# Patient Record
Sex: Female | Born: 1985 | Race: Black or African American | Hispanic: No | Marital: Single | Smoking: Former smoker
Health system: Southern US, Community
[De-identification: ages and names within clinical notes are randomized; demographics above are authoritative.]

## PROBLEM LIST (undated history)

## (undated) ENCOUNTER — Inpatient Hospital Stay (HOSPITAL_COMMUNITY): Payer: Self-pay

## (undated) DIAGNOSIS — G4730 Sleep apnea, unspecified: Secondary | ICD-10-CM

## (undated) DIAGNOSIS — F32A Depression, unspecified: Secondary | ICD-10-CM

## (undated) DIAGNOSIS — I10 Essential (primary) hypertension: Secondary | ICD-10-CM

## (undated) DIAGNOSIS — IMO0001 Reserved for inherently not codable concepts without codable children: Secondary | ICD-10-CM

## (undated) DIAGNOSIS — A549 Gonococcal infection, unspecified: Secondary | ICD-10-CM

## (undated) DIAGNOSIS — D649 Anemia, unspecified: Secondary | ICD-10-CM

## (undated) DIAGNOSIS — A749 Chlamydial infection, unspecified: Secondary | ICD-10-CM

## (undated) DIAGNOSIS — F329 Major depressive disorder, single episode, unspecified: Secondary | ICD-10-CM

## (undated) DIAGNOSIS — F419 Anxiety disorder, unspecified: Secondary | ICD-10-CM

## (undated) DIAGNOSIS — O039 Complete or unspecified spontaneous abortion without complication: Secondary | ICD-10-CM

## (undated) DIAGNOSIS — R51 Headache: Secondary | ICD-10-CM

## (undated) DIAGNOSIS — M549 Dorsalgia, unspecified: Secondary | ICD-10-CM

## (undated) DIAGNOSIS — G8929 Other chronic pain: Secondary | ICD-10-CM

## (undated) DIAGNOSIS — E669 Obesity, unspecified: Secondary | ICD-10-CM

## (undated) HISTORY — DX: Other chronic pain: G89.29

## (undated) HISTORY — DX: Depression, unspecified: F32.A

## (undated) HISTORY — PX: DILATION AND CURETTAGE OF UTERUS: SHX78

## (undated) HISTORY — DX: Dorsalgia, unspecified: M54.9

## (undated) HISTORY — DX: Sleep apnea, unspecified: G47.30

## (undated) HISTORY — PX: INDUCED ABORTION: SHX677

## (undated) HISTORY — DX: Major depressive disorder, single episode, unspecified: F32.9

---
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
## 2007-09-17 ENCOUNTER — Inpatient Hospital Stay (HOSPITAL_COMMUNITY): Admission: AD | Admit: 2007-09-17 | Discharge: 2007-09-17 | Payer: Self-pay | Admitting: Obstetrics and Gynecology

## 2007-09-28 DIAGNOSIS — D649 Anemia, unspecified: Secondary | ICD-10-CM

## 2007-09-28 HISTORY — DX: Anemia, unspecified: D64.9

## 2008-04-01 ENCOUNTER — Inpatient Hospital Stay (HOSPITAL_COMMUNITY): Admission: AD | Admit: 2008-04-01 | Discharge: 2008-04-01 | Payer: Self-pay | Admitting: Obstetrics and Gynecology

## 2008-04-08 ENCOUNTER — Inpatient Hospital Stay (HOSPITAL_COMMUNITY): Admission: RE | Admit: 2008-04-08 | Discharge: 2008-04-10 | Payer: Self-pay | Admitting: Obstetrics and Gynecology

## 2009-08-20 ENCOUNTER — Ambulatory Visit (HOSPITAL_COMMUNITY): Admission: RE | Admit: 2009-08-20 | Discharge: 2009-08-20 | Payer: Self-pay | Admitting: Obstetrics and Gynecology

## 2010-06-19 ENCOUNTER — Encounter: Admission: RE | Admit: 2010-06-19 | Discharge: 2010-06-19 | Payer: Self-pay | Admitting: Family Medicine

## 2011-02-12 NOTE — Discharge Summary (Signed)
Sandra Wong, Sandra Wong         ACCOUNT NO.:  [id number]   MEDICAL RECORD NO.:  [id number]          PATIENT TYPE:  INP   LOCATION:  9118                          FACILITY:  [facility]   PHYSICIAN:  Huel Cote, M.D. DATE OF BIRTH:  Sep 30, 1985   DATE OF ADMISSION:  04/08/2008  DATE OF DISCHARGE:  04/10/2008                               DISCHARGE SUMMARY   DISCHARGE DIAGNOSES:  1. Term pregnancy at 40 weeks, delivered.  2. Status post normal spontaneous vaginal delivery.   DISCHARGE MEDICATIONS:  1. Motrin 600 mg p.o. every 6 hours.  2. Percocet 1-2 tablets p.o. every 4 hours p.r.n.   HOSPITAL COURSE:  The patient is a 25 year old G1, P0, who was admitted  at 64 weeks' gestation for induction of labor given term status and a  favorable cervix.  Prenatal care have been uneventful except for a  weakly positive antibody screen, which resolved on followup testing.   Prenatal labs were as follows; O+ve, antibody screen positive, followup  was negative, sickle normal, rubella immune, hepatitis B surface antigen  negative, HIV negative, GC negative, Chlamydia negative, group B strep  negative, 1-hour Glucola 71 and the first trimester screen normal.   PAST OBSTETRICAL HISTORY:  None.   PAST MEDICAL HISTORY:  Migraines.   PAST SURGICAL HISTORY:  None.   PAST GYNECOLOGIC HISTORY:  None.   ALLERGIES:  No known drug allergies.   MEDICATIONS:  Prenatal vitamins only.   On admission, the patient was afebrile with stable vital signs.  Fetal  heart rate was reactive.  Cervix was 50, 1-2, and a -2 station.  She had  rupture of membranes performed with clear fluid noted.  She progressed  quickly throughout the day reaching complete dilatation that evening and  pushed well for approximately 10 minutes with a normal spontaneous  vaginal delivery of vigorous female infant, over a  second-degree  laceration.  Apgars were 8 and 9, weight was 7 pounds even.  Placenta  delivered spontaneously.   A second-degree laceration and left focal  laceration were repaired with 2-0 Vicryl.  She had a small area of  bleeding that was persistent at the introitus, which required additional  figure-of-eight sutures.  Estimated blood loss was 450 mL.  Postpartum  day#1, her hemoglobin was 8.7  down from 11.1.  However, she was doing well ambulating and her pain was  controlled with p.o. medications.  Postoperative day #2, she was stable  and felt ready for discharge home.  She was discharged with instructions  of pelvic rest and will followup in the office in 6 weeks for her full  postpartum exam.      Huel Cote, M.D.  Electronically Signed     KR/MEDQ  D:  06/03/2008  T:  06/03/2008  Job:  [id number]

## 2011-04-11 ENCOUNTER — Emergency Department (HOSPITAL_COMMUNITY): Payer: Self-pay

## 2011-04-11 ENCOUNTER — Emergency Department (HOSPITAL_COMMUNITY)
Admission: EM | Admit: 2011-04-11 | Discharge: 2011-04-11 | Disposition: A | Discharge status: Home / Self Care | Payer: Self-pay | Attending: Emergency Medicine | Admitting: Emergency Medicine

## 2011-04-11 DIAGNOSIS — M79609 Pain in unspecified limb: Secondary | ICD-10-CM | POA: Insufficient documentation

## 2011-06-24 LAB — CBC
HCT: 26.2 — ABNORMAL LOW
HCT: 33.6 — ABNORMAL LOW
Hemoglobin: 11.1 — ABNORMAL LOW
MCHC: 33.2
MCV: 84.2
MCV: 85.3
Platelets: 103 — ABNORMAL LOW
RBC: 3.07 — ABNORMAL LOW
RBC: 3.98
RDW: 16.2 — ABNORMAL HIGH
WBC: 5.8
WBC: 9.3

## 2011-06-24 LAB — RPR: RPR Ser Ql: NONREACTIVE

## 2012-01-25 ENCOUNTER — Emergency Department (HOSPITAL_COMMUNITY)
Admission: EM | Admit: 2012-01-25 | Discharge: 2012-01-25 | Disposition: A | Discharge status: Home / Self Care | Payer: Self-pay | Attending: Emergency Medicine | Admitting: Emergency Medicine

## 2012-01-25 ENCOUNTER — Encounter (HOSPITAL_COMMUNITY): Payer: Self-pay | Admitting: *Deleted

## 2012-01-25 DIAGNOSIS — R6883 Chills (without fever): Secondary | ICD-10-CM | POA: Insufficient documentation

## 2012-01-25 DIAGNOSIS — J020 Streptococcal pharyngitis: Secondary | ICD-10-CM | POA: Insufficient documentation

## 2012-01-25 LAB — RAPID STREP SCREEN (MED CTR MEBANE ONLY): Streptococcus, Group A Screen (Direct): POSITIVE — AB

## 2012-01-25 MED ORDER — HYDROCODONE-ACETAMINOPHEN 7.5-500 MG/15ML PO SOLN: 15.0000 mL | Freq: Four times a day (QID) | ORAL | Status: AC | PRN

## 2012-01-25 MED ORDER — PENICILLIN G BENZATHINE 1200000 UNIT/2ML IM SUSP
1.2000 10*6.[IU] | INTRAMUSCULAR | Status: AC
  Administered 2012-01-25: 1.2 10*6.[IU] via INTRAMUSCULAR
  Filled 2012-01-25: qty 2

## 2012-01-25 NOTE — Discharge Instructions (Signed)

## 2012-01-25 NOTE — ED Notes (Signed)
Pt reports sore throat for 2-3 days. Reports cough/nasal drainage. Denies fever.

## 2012-01-25 NOTE — ED Provider Notes (Addendum)
History     CSN: [id number]  Arrival date & time 01/25/12  1478   First MD Initiated Contact with Patient 01/25/12 [phone]      Chief Complaint  Patient presents with  . Sore Throat    (Consider location/radiation/quality/duration/timing/severity/associated sxs/prior treatment) Patient is a 25 y.o. female presenting with pharyngitis. The history is provided by the patient.  Sore Throat This is a new problem. Episode onset: 3-4 days ago. The problem occurs constantly. The problem has been gradually worsening. Pertinent negatives include no abdominal pain, no headaches and no shortness of breath. Associated symptoms comments: Sore throat and pain with swallowing. . The symptoms are aggravated by swallowing. The symptoms are relieved by nothing. She has tried acetaminophen for the symptoms. The treatment provided no relief.    History reviewed. No pertinent past medical history.  History reviewed. No pertinent past surgical history.  No family history on file.  History  Substance Use Topics  . Smoking status: Current Everyday Smoker -- 0.5 packs/day  . Smokeless tobacco: Not on file  . Alcohol Use: Yes     occasional    OB History    Grav Para Term Preterm Abortions TAB SAB Ect Mult Living                  Review of Systems  Constitutional: Positive for chills. Negative for fever.  HENT: Positive for congestion, sore throat and trouble swallowing. Negative for rhinorrhea and voice change.   Respiratory: Negative for shortness of breath.   Gastrointestinal: Negative for nausea, vomiting and abdominal pain.  Neurological: Negative for headaches.  All other systems reviewed and are negative.    Allergies  Review of patient's allergies indicates no known allergies.  Home Medications  No current outpatient prescriptions on file.  BP 105/63  Pulse 88  Temp(Src) 97.6 F (36.4 C) (Oral)  Resp 16  Ht 5\' 9"  (1.753 m)  Wt 280 lb (127.007 kg)  BMI 41.35 kg/m2  SpO2  100%  Physical Exam  Nursing note and vitals reviewed. Constitutional: She is oriented to person, place, and time. She appears well-developed and well-nourished. No distress.  HENT:  Head: Normocephalic and atraumatic.  Mouth/Throat: Mucous membranes are normal. Posterior oropharyngeal edema and posterior oropharyngeal erythema present. No oropharyngeal exudate.  Eyes: EOM are normal. Pupils are equal, round, and reactive to light.  Cardiovascular: Normal rate, regular rhythm, normal heart sounds and intact distal pulses.  Exam reveals no friction rub.   No murmur heard. Pulmonary/Chest: Effort normal and breath sounds normal. She has no wheezes. She has no rales.  Abdominal: Soft. Bowel sounds are normal. She exhibits no distension. There is no tenderness. There is no rebound and no guarding.  Musculoskeletal: Normal range of motion. She exhibits no tenderness.       No edema  Neurological: She is alert and oriented to person, place, and time. No cranial nerve deficit.  Skin: Skin is warm and dry. No rash noted.  Psychiatric: She has a normal mood and affect. Her behavior is normal.    ED Course  Procedures (including critical care time)  Labs Reviewed  RAPID STREP SCREEN - Abnormal; Notable for the following:    Streptococcus, Group A Screen (Direct) POSITIVE (*)    All other components within normal limits   No results found.   1. Strep pharyngitis       MDM   Pt with symptoms consistent with viral versus strep pharyngitis.  Well appearing here.  No signs  of breathing difficulty  No signs of otitis or abnormal abdominal findings.  Lungs are clear. Patient is well-appearing and afebrile. Strep screen pending.  8:47 AM Rapid strep is positive. Patient IM penicillin. She was discharged home.       Gwyneth Sprout, MD 01/25/12 1610  Gwyneth Sprout, MD 01/25/12 [phone]

## 2012-09-13 ENCOUNTER — Emergency Department (HOSPITAL_COMMUNITY)
Admission: EM | Admit: 2012-09-13 | Discharge: 2012-09-13 | Disposition: A | Discharge status: Home / Self Care | Payer: No Typology Code available for payment source | Attending: Emergency Medicine | Admitting: Emergency Medicine

## 2012-09-13 ENCOUNTER — Encounter (HOSPITAL_COMMUNITY): Payer: Self-pay | Admitting: Emergency Medicine

## 2012-09-13 DIAGNOSIS — Z8659 Personal history of other mental and behavioral disorders: Secondary | ICD-10-CM | POA: Insufficient documentation

## 2012-09-13 DIAGNOSIS — R51 Headache: Secondary | ICD-10-CM | POA: Insufficient documentation

## 2012-09-13 DIAGNOSIS — F17200 Nicotine dependence, unspecified, uncomplicated: Secondary | ICD-10-CM | POA: Insufficient documentation

## 2012-09-13 HISTORY — DX: Anxiety disorder, unspecified: F41.9

## 2012-09-13 HISTORY — DX: Headache: R51

## 2012-09-13 MED ORDER — SODIUM CHLORIDE 0.9 % IV BOLUS (SEPSIS)
1000.0000 mL | Freq: Once | INTRAVENOUS | Status: AC
  Administered 2012-09-13: 1000 mL via INTRAVENOUS

## 2012-09-13 MED ORDER — METOCLOPRAMIDE HCL 5 MG/ML IJ SOLN
10.0000 mg | Freq: Once | INTRAMUSCULAR | Status: AC
  Administered 2012-09-13: 10 mg via INTRAVENOUS
  Filled 2012-09-13: qty 2

## 2012-09-13 MED ORDER — KETOROLAC TROMETHAMINE 30 MG/ML IJ SOLN
30.0000 mg | Freq: Once | INTRAMUSCULAR | Status: AC
  Administered 2012-09-13: 30 mg via INTRAVENOUS
  Filled 2012-09-13: qty 1

## 2012-09-13 NOTE — ED Notes (Addendum)
C/o headache and dizziness since 12:30pm today.  No neuro deficits noted on triage exam. Denies nausea.  Reports history of frequent headaches.

## 2012-09-13 NOTE — ED Provider Notes (Addendum)
History     CSN: [id number]  Arrival date & time 09/13/12  4098   First MD Initiated Contact with Patient 09/13/12 2110      Chief Complaint  Patient presents with  . Headache    (Consider location/radiation/quality/duration/timing/severity/associated sxs/prior treatment) HPI Pt with history of headaches off and on for the last 4 years since placement of Mirena reports moderate to severe throbbing frontal headache, associated with photophobia and nausea. Similar to previous. No particular provoking or relieving factors.   Past Medical History  Diagnosis Date  . Headache   . Anxiety     History reviewed. No pertinent past surgical history.  No family history on file.  History  Substance Use Topics  . Smoking status: Current Every Day Smoker -- 0.5 packs/day  . Smokeless tobacco: Not on file  . Alcohol Use: Yes     Comment: occasional    OB History    Grav Para Term Preterm Abortions TAB SAB Ect Mult Living                  Review of Systems All other systems reviewed and are negative except as noted in HPI.   Allergies  Other  Home Medications   Current Outpatient Rx  Name  Route  Sig  Dispense  Refill  . LEVONORGESTREL 20 MCG/24HR IU IUD   Intrauterine   1 each by Intrauterine route once.           BP 95/54  Pulse 83  Temp 98.5 F (36.9 C) (Oral)  SpO2 100%  Physical Exam  Nursing note and vitals reviewed. Constitutional: She is oriented to person, place, and time. She appears well-developed and well-nourished.  HENT:  Head: Normocephalic and atraumatic.  Eyes: EOM are normal. Pupils are equal, round, and reactive to light.  Neck: Normal range of motion. Neck supple.  Cardiovascular: Normal rate, normal heart sounds and intact distal pulses.   Pulmonary/Chest: Effort normal and breath sounds normal.  Abdominal: Bowel sounds are normal. She exhibits no distension. There is no tenderness.  Musculoskeletal: Normal range of motion. She  exhibits no edema and no tenderness.  Neurological: She is alert and oriented to person, place, and time. She has normal strength. No cranial nerve deficit or sensory deficit.  Skin: Skin is warm and dry. No rash noted.  Psychiatric: She has a normal mood and affect.    ED Course  Procedures (including critical care time)  Labs Reviewed - No data to display No results found.   No diagnosis found.    MDM  Pt feeling better after meds and IVF. Ready to go home.       Charles B. Bernette Mayers, MD 09/13/12 2330

## 2013-05-01 ENCOUNTER — Encounter (HOSPITAL_COMMUNITY): Payer: Self-pay | Admitting: *Deleted

## 2013-05-01 ENCOUNTER — Inpatient Hospital Stay (HOSPITAL_COMMUNITY)
Admission: AD | Admit: 2013-05-01 | Discharge: 2013-05-02 | Disposition: A | Discharge status: Home / Self Care | Payer: PRIVATE HEALTH INSURANCE | Source: Ambulatory Visit | Attending: Obstetrics & Gynecology | Admitting: Obstetrics & Gynecology

## 2013-05-01 DIAGNOSIS — T8389XA Other specified complication of genitourinary prosthetic devices, implants and grafts, initial encounter: Secondary | ICD-10-CM

## 2013-05-01 DIAGNOSIS — R109 Unspecified abdominal pain: Secondary | ICD-10-CM | POA: Insufficient documentation

## 2013-05-01 DIAGNOSIS — Z30431 Encounter for routine checking of intrauterine contraceptive device: Secondary | ICD-10-CM | POA: Insufficient documentation

## 2013-05-01 LAB — URINALYSIS, ROUTINE W REFLEX MICROSCOPIC
Bilirubin Urine: NEGATIVE
Nitrite: NEGATIVE
Specific Gravity, Urine: 1.025 (ref 1.005–1.030)
pH: 5.5 (ref 5.0–8.0)

## 2013-05-01 LAB — URINE MICROSCOPIC-ADD ON

## 2013-05-01 LAB — POCT PREGNANCY, URINE: Preg Test, Ur: NEGATIVE

## 2013-05-01 MED ORDER — KETOROLAC TROMETHAMINE 60 MG/2ML IM SOLN
60.0000 mg | Freq: Once | INTRAMUSCULAR | Status: AC
  Administered 2013-05-01: 60 mg via INTRAMUSCULAR
  Filled 2013-05-01: qty 2

## 2013-05-01 NOTE — MAU Note (Signed)
Patient is in with c/o llq pain and cramping, vaginal spotting, odorouc vaginal discharge. She also wants mirena iud removed because it was placed more than 5 years ago.

## 2013-05-01 NOTE — MAU Provider Note (Signed)
History     CSN: [id number]  Arrival date and time: 05/01/13 1911   First Provider Initiated Contact with Patient 05/01/13 2150      Chief Complaint  Patient presents with  . Abdominal Cramping  . Vaginal Bleeding  . Vaginal Discharge   HPI Comments: Sandra Wong presents to the MAU today with suprapubic pain.  She attributes her pain to her Mirena IUD, and would like to have it taken out today.    Abdominal Cramping This is a new problem. The current episode started in the past 7 days. The onset quality is gradual. The pain is located in the suprapubic region. The pain is at a severity of 5/10. The quality of the pain is aching and cramping. Associated symptoms include constipation and headaches. Pertinent negatives include no anorexia, dysuria, fever, frequency, nausea, vomiting or weight loss. Nothing aggravates the pain. The pain is relieved by movement. Treatments tried: Ibuprofen. The treatment provided significant relief.  Vaginal Bleeding The patient's primary symptoms include a vaginal discharge. Associated symptoms include constipation and headaches. Pertinent negatives include no anorexia, dysuria, fever, frequency, nausea, rash or vomiting.  Vaginal Discharge The patient's primary symptoms include a vaginal discharge. Associated symptoms include constipation and headaches. Pertinent negatives include no anorexia, dysuria, fever, frequency, nausea, rash or vomiting.     Past Medical History  Diagnosis Date  . Headache(784.0)   . Anxiety     Past Surgical History  Procedure Laterality Date  . No past surgeries      History reviewed. No pertinent family history.  History  Substance Use Topics  . Smoking status: Current Every Day Smoker -- 0.50 packs/day  . Smokeless tobacco: Not on file  . Alcohol Use: Yes     Comment: occasional    Allergies:  Allergies  Allergen Reactions  . Other     Has an allergy to some type of eye drop but does not remember the  name    Prescriptions prior to admission  Medication Sig Dispense Refill  . Ibuprofen (ADVIL MIGRAINE PO) Take 2 tablets by mouth daily as needed (migraine).      . IRON PO Take 1 tablet by mouth daily.      . Multiple Vitamin (MULTIVITAMIN WITH MINERALS) TABS tablet Take 1 tablet by mouth daily.      Marland Kitchen levonorgestrel (MIRENA) 20 MCG/24HR IUD 1 each by Intrauterine route once.        Review of Systems  Constitutional: Negative for fever and weight loss.  HENT: Negative for neck pain.   Respiratory: Negative.   Gastrointestinal: Positive for constipation. Negative for nausea, vomiting and anorexia.  Genitourinary: Positive for vaginal bleeding and vaginal discharge. Negative for dysuria and frequency.  Skin: Negative for rash.  Neurological: Positive for headaches. Negative for dizziness and tingling.   Physical Exam   Blood pressure 123/73, pulse 77, temperature 98.1 F (36.7 C), temperature source Oral, resp. rate 18, height 5\' 9"  (1.753 m), weight 128.113 kg (282 lb 7 oz), last menstrual period 04/24/2013, SpO2 100.00%.  Physical Exam  Constitutional: She appears well-developed and well-nourished. No distress.  Cardiovascular: Normal rate, regular rhythm, normal heart sounds and intact distal pulses.  Exam reveals no gallop and no friction rub.   No murmur heard. Respiratory: Effort normal and breath sounds normal. No respiratory distress. She has no wheezes. She has no rales. She exhibits no tenderness.  GI: Soft. Bowel sounds are normal. She exhibits no distension and no mass. There is no tenderness. There  is no rebound and no guarding.  Skin: She is not diaphoretic.   CLARK, MICHAEL L 05/01/2013, 10:02 PM   PELVIC EXAM: NEFG except for 2 cm raised, NT, flesh-colored, mass on left labia majora. Small amount of blood-tinged discharge, normal odor. CerviX clean. No CMT.   MAU Course  Procedures Speculum placed. IUD strings visualized w/ aid of cotton swab. String grasped w/  ring forceps. IUD removed intact w/ moderate traction. Scant blood loss. Pt tolerated well.   Results for orders placed during the hospital encounter of 05/01/13 (from the past 24 hour(s))  URINALYSIS, ROUTINE W REFLEX MICROSCOPIC     Status: Abnormal   Collection Time    05/01/13  7:55 PM      Result Value Range   Color, Urine YELLOW  YELLOW   APPearance CLEAR  CLEAR   Specific Gravity, Urine 1.025  1.005 - 1.030   pH 5.5  5.0 - 8.0   Glucose, UA NEGATIVE  NEGATIVE mg/dL   Hgb urine dipstick LARGE (*) NEGATIVE   Bilirubin Urine NEGATIVE  NEGATIVE   Ketones, ur NEGATIVE  NEGATIVE mg/dL   Protein, ur NEGATIVE  NEGATIVE mg/dL   Urobilinogen, UA 0.2  0.0 - 1.0 mg/dL   Nitrite NEGATIVE  NEGATIVE   Leukocytes, UA NEGATIVE  NEGATIVE  URINE MICROSCOPIC-ADD ON     Status: Abnormal   Collection Time    05/01/13  7:55 PM      Result Value Range   Squamous Epithelial / LPF MANY (*) RARE   WBC, UA 0-2  <3 WBC/hpf   RBC / HPF 0-2  <3 RBC/hpf   Bacteria, UA FEW (*) RARE   Urine-Other MUCOUS PRESENT    POCT PREGNANCY, URINE     Status: None   Collection Time    05/01/13  8:14 PM      Result Value Range   Preg Test, Ur NEGATIVE  NEGATIVE  WET PREP, GENITAL     Status: Abnormal   Collection Time    05/01/13 11:40 PM      Result Value Range   Yeast Wet Prep HPF POC NONE SEEN  NONE SEEN   Trich, Wet Prep NONE SEEN  NONE SEEN   Clue Cells Wet Prep HPF POC FEW (*) NONE SEEN   WBC, Wet Prep HPF POC FEW (*) NONE SEEN   IUD in x 5 years per pt.  Cramping improved significantly w/ Toradol and removal of IUD.   I performed the pelvic exam and IUD removal and agree with above.  Assessment: 1. IUD complication, initial encounter    Plan: D/C home in stable condition. Start OCPs ASAP. GC/CT pending. Follow-up Information   Follow up with [facility] In 1 month.   Contact information:   [address] [place] [address] [phone]      Follow up with [facility]. (As needed in emergencies)    Contact information:   [address] [id number] [place] [address] [phone]        Medication List    STOP taking these medications       levonorgestrel 20 MCG/24HR IUD  Commonly known as:  MIRENA      TAKE these medications       ADVIL MIGRAINE PO  Take 2 tablets by mouth daily as needed (migraine).     IRON PO  Take 1 tablet by mouth daily.     multivitamin with minerals Tabs tablet  Take 1 tablet by mouth daily.     norgestimate-ethinyl estradiol 0.25-35 MG-MCG tablet  Commonly known as:  ORTHO-CYCLEN,SPRINTEC,PREVIFEM  Take 1 tablet by mouth daily.       [place], [place] 05/02/2013 7:53 AM

## 2013-05-02 DIAGNOSIS — N898 Other specified noninflammatory disorders of vagina: Secondary | ICD-10-CM

## 2013-05-02 DIAGNOSIS — N938 Other specified abnormal uterine and vaginal bleeding: Secondary | ICD-10-CM

## 2013-05-02 DIAGNOSIS — R1084 Generalized abdominal pain: Secondary | ICD-10-CM

## 2013-05-02 DIAGNOSIS — N949 Unspecified condition associated with female genital organs and menstrual cycle: Secondary | ICD-10-CM

## 2013-05-02 DIAGNOSIS — Z30432 Encounter for removal of intrauterine contraceptive device: Secondary | ICD-10-CM

## 2013-05-02 LAB — WET PREP, GENITAL: Trich, Wet Prep: NONE SEEN

## 2013-05-02 MED ORDER — NORGESTIMATE-ETH ESTRADIOL 0.25-35 MG-MCG PO TABS: 1.0000 | ORAL_TABLET | Freq: Every day | ORAL | Status: DC

## 2013-05-08 NOTE — MAU Provider Note (Signed)
Attestation of Attending Supervision of Advanced Practitioner (CNM/NP): Evaluation and management procedures were performed by the Advanced Practitioner under my supervision and collaboration. I have reviewed the Advanced Practitioner's note and chart, and I agree with the management and plan.  Sandra Yin H. 3:33 PM

## 2013-06-03 ENCOUNTER — Emergency Department (HOSPITAL_COMMUNITY)
Admission: EM | Admit: 2013-06-03 | Discharge: 2013-06-03 | Disposition: A | Discharge status: Home / Self Care | Payer: Medicaid Other | Attending: Emergency Medicine | Admitting: Emergency Medicine

## 2013-06-03 ENCOUNTER — Encounter (HOSPITAL_COMMUNITY): Payer: Self-pay

## 2013-06-03 DIAGNOSIS — Z8659 Personal history of other mental and behavioral disorders: Secondary | ICD-10-CM | POA: Insufficient documentation

## 2013-06-03 DIAGNOSIS — Z79899 Other long term (current) drug therapy: Secondary | ICD-10-CM | POA: Insufficient documentation

## 2013-06-03 DIAGNOSIS — J039 Acute tonsillitis, unspecified: Secondary | ICD-10-CM

## 2013-06-03 DIAGNOSIS — F17200 Nicotine dependence, unspecified, uncomplicated: Secondary | ICD-10-CM | POA: Insufficient documentation

## 2013-06-03 MED ORDER — PENICILLIN G BENZATHINE 1200000 UNIT/2ML IM SUSP
1.2000 10*6.[IU] | Freq: Once | INTRAMUSCULAR | Status: AC
  Administered 2013-06-03: 1.2 10*6.[IU] via INTRAMUSCULAR
  Filled 2013-06-03: qty 2

## 2013-06-03 MED ORDER — HYDROCODONE-ACETAMINOPHEN 5-325 MG PO TABS: 1.0000 | ORAL_TABLET | ORAL | Status: DC | PRN

## 2013-06-03 NOTE — ED Notes (Signed)
Pt dc to home. Pt sts understanding to dc instructions. Pt ambulatory to exit without difficulty. Pt denies need for w/c. No adverse reaction noted to site.

## 2013-06-03 NOTE — ED Provider Notes (Signed)
CSN: [id number]     Arrival date & time 06/03/13  0031 History   First MD Initiated Contact with Patient 06/03/13 0117     Chief Complaint  Patient presents with  . Sore Throat   (Consider location/radiation/quality/duration/timing/severity/associated sxs/prior Treatment) HPI History provided by pt.   Pt presents w/ sore throat since yesterday.  Feels like sharp knives when she attempts to swallow food.  No associated fever, nasal congestion, rhinorrhea, cough.  Has had minimal relief w/ ibuprofen.  No known sick contacts.   Past Medical History  Diagnosis Date  . Headache(784.0)   . Anxiety    Past Surgical History  Procedure Laterality Date  . No past surgeries     History reviewed. No pertinent family history. History  Substance Use Topics  . Smoking status: Current Every Day Smoker -- 0.50 packs/day  . Smokeless tobacco: Not on file  . Alcohol Use: Yes     Comment: occasional   OB History   Grav Para Term Preterm Abortions TAB SAB Ect Mult Living   1 1 1       1      Review of Systems  All other systems reviewed and are negative.    Allergies  Other  Home Medications   Current Outpatient Rx  Name  Route  Sig  Dispense  Refill  . HYDROcodone-acetaminophen (NORCO/VICODIN) 5-325 MG per tablet   Oral   Take 1 tablet by mouth every 4 (four) hours as needed for pain.   6 tablet   0   . Ibuprofen (ADVIL MIGRAINE PO)   Oral   Take 2 tablets by mouth daily as needed (migraine).         . IRON PO   Oral   Take 1 tablet by mouth daily.         . Multiple Vitamin (MULTIVITAMIN WITH MINERALS) TABS tablet   Oral   Take 1 tablet by mouth daily.         . norgestimate-ethinyl estradiol (ORTHO-CYCLEN,SPRINTEC,PREVIFEM) 0.25-35 MG-MCG tablet   Oral   Take 1 tablet by mouth daily.   1 Package   2    BP 132/78  Pulse 85  Temp(Src) 98.6 F (37 C) (Oral)  Resp 20  SpO2 99% Physical Exam  Nursing note and vitals reviewed. Constitutional: She is oriented  to person, place, and time. She appears well-developed and well-nourished. No distress.  HENT:  Head: Normocephalic and atraumatic.  Symmetric enlargement as well as erythema and exudate of tonsils.  Uvula mid-line.  No trismus.  Nml TM/EAC bilaterally.  Eyes:  Normal appearance  Neck: Normal range of motion.  Pulmonary/Chest: Effort normal.  Musculoskeletal: Normal range of motion.  Neurological: She is alert and oriented to person, place, and time.  Psychiatric: She has a normal mood and affect. Her behavior is normal.    ED Course  Procedures (including critical care time) Labs Review Labs Reviewed - No data to display Imaging Review No results found.  MDM   1. Tonsillitis   27yo healthy F presents w/ sore throat.  Exam consistent w/ tonsillitis.  Pt received IM bicillin empirically and d/c'd home w/ 6 vicodin.  Recommended q8hr ibuprofen as well. Return precautions discussed.    Otilio Miu, PA-C 06/03/13 [phone]

## 2013-06-03 NOTE — ED Notes (Signed)
Pt reports productive cough with thick clear mucus, nasal congestion and drainage, headache, and sore throat increase pain when swallowing starting yesterady

## 2013-06-03 NOTE — ED Provider Notes (Signed)
Medical screening examination/treatment/procedure(s) were performed by non-physician practitioner and as supervising physician I was immediately available for consultation/collaboration.   Shanna Cisco, MD 06/03/13 1910

## 2013-06-04 ENCOUNTER — Encounter (HOSPITAL_COMMUNITY): Payer: Self-pay | Admitting: Emergency Medicine

## 2013-06-04 ENCOUNTER — Emergency Department (HOSPITAL_COMMUNITY)
Admission: EM | Admit: 2013-06-04 | Discharge: 2013-06-04 | Disposition: A | Discharge status: Home / Self Care | Payer: Medicaid Other | Attending: Emergency Medicine | Admitting: Emergency Medicine

## 2013-06-04 DIAGNOSIS — R5381 Other malaise: Secondary | ICD-10-CM | POA: Insufficient documentation

## 2013-06-04 DIAGNOSIS — F17200 Nicotine dependence, unspecified, uncomplicated: Secondary | ICD-10-CM | POA: Insufficient documentation

## 2013-06-04 DIAGNOSIS — R599 Enlarged lymph nodes, unspecified: Secondary | ICD-10-CM | POA: Insufficient documentation

## 2013-06-04 DIAGNOSIS — Z8669 Personal history of other diseases of the nervous system and sense organs: Secondary | ICD-10-CM | POA: Insufficient documentation

## 2013-06-04 DIAGNOSIS — J039 Acute tonsillitis, unspecified: Secondary | ICD-10-CM | POA: Insufficient documentation

## 2013-06-04 DIAGNOSIS — H6120 Impacted cerumen, unspecified ear: Secondary | ICD-10-CM | POA: Insufficient documentation

## 2013-06-04 DIAGNOSIS — F411 Generalized anxiety disorder: Secondary | ICD-10-CM | POA: Insufficient documentation

## 2013-06-04 LAB — CBC WITH DIFFERENTIAL/PLATELET
Band Neutrophils: 0 % (ref 0–10)
Basophils Absolute: 0.1 10*3/uL (ref 0.0–0.1)
Basophils Relative: 1 % (ref 0–1)
Eosinophils Absolute: 0.2 10*3/uL (ref 0.0–0.7)
Eosinophils Relative: 3 % (ref 0–5)
HCT: 36.8 % (ref 36.0–46.0)
Hemoglobin: 12.7 g/dL (ref 12.0–15.0)
MCH: 28.2 pg (ref 26.0–34.0)
MCHC: 34.5 g/dL (ref 30.0–36.0)
MCV: 81.6 fL (ref 78.0–100.0)
Metamyelocytes Relative: 0 %
Myelocytes: 0 %
Neutrophils Relative %: 56 % (ref 43–77)
Promyelocytes Absolute: 0 %
RBC: 4.51 MIL/uL (ref 3.87–5.11)

## 2013-06-04 LAB — RAPID STREP SCREEN (MED CTR MEBANE ONLY): Streptococcus, Group A Screen (Direct): NEGATIVE

## 2013-06-04 MED ORDER — CLINDAMYCIN HCL 150 MG PO CAPS: 300.0000 mg | ORAL_CAPSULE | Freq: Three times a day (TID) | ORAL | Status: AC

## 2013-06-04 MED ORDER — DEXAMETHASONE SODIUM PHOSPHATE 10 MG/ML IJ SOLN
10.0000 mg | Freq: Once | INTRAMUSCULAR | Status: AC
  Administered 2013-06-04: 10 mg via INTRAMUSCULAR
  Filled 2013-06-04: qty 1

## 2013-06-04 NOTE — ED Notes (Addendum)
Pt was here yesterday for sore throat. Dx'd with pharyngitis. Returns today because she states "my throat feels like it is on fire". No strep test obtained yesterday.

## 2013-06-04 NOTE — ED Notes (Signed)
Pt c/o sore throat; pt seen here for same and given meds but not better

## 2013-06-04 NOTE — ED Provider Notes (Signed)
CSN: [id number]     Arrival date & time 06/04/13  6213 History   First MD Initiated Contact with Patient 06/04/13 0957     Chief Complaint  Patient presents with  . Sore Throat    HPI  Sandra Wong is a 27 y.o. female with a PMH of headaches and anxiety who presents to the ED for evaluation of a sore throat.  History was provided by the patient.  Patient complains of a sore throat which started two days ago.  She was seen in the ED here yesterday (06/03/13) and received a PCN injection.  She states that her condition has not improved since receiving the antibiotics.  She states she has a sharp pain when swallowing.  She has been taking the Vicodin with minimal relief in her symptoms.  She denies any neck pain/stiffness, drooling or difficulty controlling her secretions or breathing.  She has had decreased intake due to her throat pain. She also states she has been very fatigued and had decreased activity. She denies any fever, chills, rhinorrhea, congestion, cough, chest pain, SOB, abdominal pain, nausea, emesis, dysuria, hematuria, diarrhea, constipation, headache, dizziness, or lightheadedness. No known sick contacts.     Past Medical History  Diagnosis Date  . Headache(784.0)   . Anxiety    Past Surgical History  Procedure Laterality Date  . No past surgeries     History reviewed. No pertinent family history. History  Substance Use Topics  . Smoking status: Current Every Day Smoker -- 0.50 packs/day  . Smokeless tobacco: Not on file  . Alcohol Use: Yes     Comment: occasional   OB History   Grav Para Term Preterm Abortions TAB SAB Ect Mult Living   1 1 1       1      Review of Systems  Constitutional: Positive for activity change and fatigue. Negative for fever, chills and appetite change.  HENT: Positive for sore throat. Negative for ear pain, congestion, facial swelling, rhinorrhea, drooling, mouth sores, trouble swallowing, neck pain, neck stiffness, dental problem, voice  change and sinus pressure.   Eyes: Negative for visual disturbance.  Respiratory: Negative for cough and shortness of breath.   Cardiovascular: Negative for chest pain and leg swelling.  Gastrointestinal: Negative for nausea, vomiting, abdominal pain, diarrhea and constipation.  Genitourinary: Negative for dysuria.  Musculoskeletal: Negative for back pain.  Skin: Negative for rash.  Neurological: Negative for dizziness, weakness, light-headedness and headaches.    Allergies  Other  Home Medications   Current Outpatient Rx  Name  Route  Sig  Dispense  Refill  . HYDROcodone-acetaminophen (NORCO/VICODIN) 5-325 MG per tablet   Oral   Take 1 tablet by mouth every 4 (four) hours as needed for pain.         Marland Kitchen ibuprofen (ADVIL,MOTRIN) 200 MG tablet   Oral   Take 400 mg by mouth every 6 (six) hours as needed for pain.         . Multiple Vitamin (MULTIVITAMIN WITH MINERALS) TABS tablet   Oral   Take 1 tablet by mouth daily.          BP 118/67  Pulse 94  Temp(Src) 99.1 F (37.3 C) (Oral)  Resp 20  SpO2 99%  Filed Vitals:   06/04/13 0841 06/04/13 1241  BP: 118/67 118/71  Pulse: 94 82  Temp: 99.1 F (37.3 C) 97.5 F (36.4 C)  TempSrc: Oral Oral  Resp: 20 16  SpO2: 99% 99%   Physical Exam  Nursing note and vitals reviewed. Constitutional: She is oriented to person, place, and time. She appears well-developed and well-nourished. No distress.  HENT:  Head: Normocephalic and atraumatic.  Right Ear: External ear normal.  Left Ear: External ear normal.  Nose: Nose normal.  Mouth/Throat: Oropharyngeal exudate present.  Tonsils 2+ bilaterally with erythema and exudates.  Uvula midline.  No trismus.  No tenderness to palpation to the jaw throughout.  Right TM unable to be visualized due to cerumen.  Left TM gray and translucent.    Eyes: Conjunctivae are normal. Pupils are equal, round, and reactive to light. Right eye exhibits no discharge. Left eye exhibits no discharge.   Neck: Normal range of motion. Neck supple.  Neck is symmetrical.  Tender mobile submandibular LAD bilaterally.    Cardiovascular: Normal rate, regular rhythm and intact distal pulses.  Exam reveals no gallop and no friction rub.   No murmur heard. Pulmonary/Chest: Effort normal and breath sounds normal. No respiratory distress. She has no wheezes. She has no rales. She exhibits no tenderness.  Abdominal: Soft. Bowel sounds are normal. She exhibits no distension. There is no tenderness. There is no rebound and no guarding.  Musculoskeletal: Normal range of motion. She exhibits no edema and no tenderness.  Lymphadenopathy:    She has cervical adenopathy.  Neurological: She is alert and oriented to person, place, and time.  Skin: Skin is warm and dry. She is not diaphoretic.    ED Course  Procedures (including critical care time) Labs Review Labs Reviewed  RAPID STREP SCREEN  CULTURE, GROUP A STREP   Imaging Review No results found.  Results for orders placed during the hospital encounter of 06/04/13  RAPID STREP SCREEN      Result Value Range   Streptococcus, Group A Screen (Direct) NEGATIVE  NEGATIVE  MONONUCLEOSIS SCREEN      Result Value Range   Mono Screen NEGATIVE  NEGATIVE  CBC WITH DIFFERENTIAL      Result Value Range   WBC 8.1  4.0 - 10.5 K/uL   RBC 4.51  3.87 - 5.11 MIL/uL   Hemoglobin 12.7  12.0 - 15.0 g/dL   HCT 40.9  81.1 - 91.4 %   MCV 81.6  78.0 - 100.0 fL   MCH 28.2  26.0 - 34.0 pg   MCHC 34.5  30.0 - 36.0 g/dL   RDW 78.2  95.6 - 21.3 %   Platelets 263  150 - 400 K/uL   Neutrophils Relative % 56  43 - 77 %   Lymphocytes Relative 26  12 - 46 %   Monocytes Relative 14 (*) 3 - 12 %   Eosinophils Relative 3  0 - 5 %   Basophils Relative 1  0 - 1 %   Band Neutrophils 0  0 - 10 %   Metamyelocytes Relative 0     Myelocytes 0     Promyelocytes Absolute 0     Blasts 0     nRBC 0  0 /100 WBC   Neutro Abs 4.6  1.7 - 7.7 K/uL   Lymphs Abs 2.1  0.7 - 4.0 K/uL    Monocytes Absolute 1.1 (*) 0.1 - 1.0 K/uL   Eosinophils Absolute 0.2  0.0 - 0.7 K/uL   Basophils Absolute 0.1  0.0 - 0.1 K/uL     MDM   1. Tonsillitis     Sandra Wong is a 27 y.o. female with a PMH of headaches and anxiety who presents to the ED  for evaluation of a sore throat.  Rapid strep, CBC and monospot was ordered to further evaluate.  Decadron was ordered for symptomatic relief.     Etiology of sore throat is likely tonsillitis with a viral vs. bacterial etiology.  Monospot and rapid strep were negative.  Throat culture is pending.  Patient did not have leukocytosis or fever and remained in no acute distress throughout her ED visit.  Patient was given Decadron in the ED to help with swelling.  Patient was prescribed clindamycin for outpatient management.  Patient received PCN IM in the ED yesterday.  She was instructed to follow-up with a PCP as soon as possible this week. She was instructed to return to the ED if they experience any difficulty controlling secretions/breathing, fever, worsening symptoms, neck swelling, or other concerns.  Patient was in agreement with discharge and plan.     Final impressions: 1. Tonsillitis     Greer Ee Hildegarde Dunaway PA-C     Jillyn Ledger, PA-C 06/05/13 1155

## 2013-06-06 LAB — CULTURE, GROUP A STREP

## 2013-06-06 NOTE — ED Provider Notes (Signed)
Medical screening examination/treatment/procedure(s) were performed by non-physician practitioner and as supervising physician I was immediately available for consultation/collaboration.   Darlys Gales, MD 06/06/13 [phone]

## 2013-06-07 ENCOUNTER — Emergency Department (HOSPITAL_COMMUNITY)
Admission: EM | Admit: 2013-06-07 | Discharge: 2013-06-08 | Disposition: A | Discharge status: Home / Self Care | Payer: Medicaid Other | Attending: Emergency Medicine | Admitting: Emergency Medicine

## 2013-06-07 ENCOUNTER — Encounter (HOSPITAL_COMMUNITY): Payer: Self-pay | Admitting: Emergency Medicine

## 2013-06-07 ENCOUNTER — Emergency Department (HOSPITAL_COMMUNITY): Payer: Medicaid Other

## 2013-06-07 DIAGNOSIS — J039 Acute tonsillitis, unspecified: Secondary | ICD-10-CM | POA: Insufficient documentation

## 2013-06-07 DIAGNOSIS — F17200 Nicotine dependence, unspecified, uncomplicated: Secondary | ICD-10-CM | POA: Insufficient documentation

## 2013-06-07 DIAGNOSIS — Z792 Long term (current) use of antibiotics: Secondary | ICD-10-CM | POA: Insufficient documentation

## 2013-06-07 DIAGNOSIS — R599 Enlarged lymph nodes, unspecified: Secondary | ICD-10-CM | POA: Insufficient documentation

## 2013-06-07 DIAGNOSIS — Z8659 Personal history of other mental and behavioral disorders: Secondary | ICD-10-CM | POA: Insufficient documentation

## 2013-06-07 DIAGNOSIS — Z79899 Other long term (current) drug therapy: Secondary | ICD-10-CM | POA: Insufficient documentation

## 2013-06-07 LAB — CBC WITH DIFFERENTIAL/PLATELET
Basophils Relative: 0 % (ref 0–1)
Eosinophils Absolute: 0 10*3/uL (ref 0.0–0.7)
Eosinophils Relative: 0 % (ref 0–5)
HCT: 39.7 % (ref 36.0–46.0)
Hemoglobin: 13.3 g/dL (ref 12.0–15.0)
MCH: 27.3 pg (ref 26.0–34.0)
MCHC: 33.5 g/dL (ref 30.0–36.0)
MCV: 81.4 fL (ref 78.0–100.0)
Monocytes Absolute: 1.4 10*3/uL — ABNORMAL HIGH (ref 0.1–1.0)
Monocytes Relative: 13 % — ABNORMAL HIGH (ref 3–12)
Neutro Abs: 6.9 10*3/uL (ref 1.7–7.7)
RDW: 14.9 % (ref 11.5–15.5)

## 2013-06-07 LAB — COMPREHENSIVE METABOLIC PANEL
Albumin: 3.5 g/dL (ref 3.5–5.2)
BUN: 8 mg/dL (ref 6–23)
Calcium: 8.9 mg/dL (ref 8.4–10.5)
Chloride: 97 mEq/L (ref 96–112)
Creatinine, Ser: 0.92 mg/dL (ref 0.50–1.10)
Total Bilirubin: 0.4 mg/dL (ref 0.3–1.2)

## 2013-06-07 MED ORDER — IOHEXOL 300 MG/ML  SOLN
75.0000 mL | Freq: Once | INTRAMUSCULAR | Status: AC | PRN
  Administered 2013-06-07: 75 mL via INTRAVENOUS

## 2013-06-07 MED ORDER — HYDROCODONE-ACETAMINOPHEN 5-325 MG PO TABS
2.0000 | ORAL_TABLET | Freq: Once | ORAL | Status: AC
  Administered 2013-06-07: 2 via ORAL
  Filled 2013-06-07: qty 2

## 2013-06-07 NOTE — ED Notes (Signed)
The patient is here for the 3rd visit for sorethroat. She received PCN on first visit and is now on Cleocin. Tonsils remain red and enlarged with white/yellow exudate.

## 2013-06-07 NOTE — ED Notes (Signed)
Pt c/o continued pain and swelling in throat; pt seen here x 2

## 2013-06-07 NOTE — ED Provider Notes (Signed)
CSN: [id number]     Arrival date & time 06/07/13  1813 History  This chart was scribed for non-physician practitioner Georgeanna Harrison, working with Glynn Octave, MD by Dorothey Baseman, ED Scribe. This patient was seen in room TR04C/TR04C and the patient's care was started at 8:17 PM.    Chief Complaint  Patient presents with  . Sore Throat   The history is provided by the patient. No language interpreter was used.   HPI Comments: Sandra Wong is a 27 y.o. female who presents to the Emergency Department complaining of pain and swelling of the throat for the past few weeks. Patient states that she has been seen in the ED twice over the past few days and was given an injection of Penicillin first, then given an injection of Decadron and a prescription for Clindamycin, but states that her symptoms have remained unchanged.  She is taking the Clindamycin as prescribed.  She reports associated pain with and difficulty swallowing, some difficulty breathing during sleep, and fever last night. Patient states that she has never been diagnosed with strep throat.  Past Medical History  Diagnosis Date  . Headache(784.0)   . Anxiety    Past Surgical History  Procedure Laterality Date  . No past surgeries     History reviewed. No pertinent family history. History  Substance Use Topics  . Smoking status: Current Every Day Smoker -- 0.50 packs/day  . Smokeless tobacco: Not on file  . Alcohol Use: Yes     Comment: occasional   OB History   Grav Para Term Preterm Abortions TAB SAB Ect Mult Living   1 1 1       1      Review of Systems  A complete 10 system review of systems was obtained and all systems are negative except as noted in the HPI and PMH.   Allergies  Other  Home Medications   Current Outpatient Rx  Name  Route  Sig  Dispense  Refill  . clindamycin (CLEOCIN) 150 MG capsule   Oral   Take 2 capsules (300 mg total) by mouth 3 (three) times daily. May dispense as 150mg   capsules   60 capsule   0   . HYDROcodone-acetaminophen (NORCO/VICODIN) 5-325 MG per tablet   Oral   Take 1 tablet by mouth every 4 (four) hours as needed for pain.         Marland Kitchen ibuprofen (ADVIL,MOTRIN) 200 MG tablet   Oral   Take 400 mg by mouth every 6 (six) hours as needed for pain.         . Multiple Vitamin (MULTIVITAMIN WITH MINERALS) TABS tablet   Oral   Take 1 tablet by mouth daily.          Triage Vitals: BP 125/72  Pulse 117  Temp(Src) 99.1 F (37.3 C) (Oral)  Resp 18  SpO2 97%  Physical Exam  Nursing note and vitals reviewed. Constitutional: She is oriented to person, place, and time. She appears well-developed and well-nourished. No distress.  HENT:  Head: Normocephalic and atraumatic.  Mouth/Throat: Uvula is midline. No trismus in the jaw. No edematous.  Bilateral swelling of the tonsils. Bilateral tonsillar exudate. Uvula is midline.  Patient handling secretions well.    Eyes: Conjunctivae are normal.  Neck: Normal range of motion. Neck supple.  Tenderness to palpation to the anterior cervical lymph nodes.  Cardiovascular: Normal rate, regular rhythm and normal heart sounds.   Pulmonary/Chest: Effort normal and breath sounds normal.  No respiratory distress.  Musculoskeletal: Normal range of motion.  Lymphadenopathy:    She has cervical adenopathy.  Neurological: She is alert and oriented to person, place, and time.  Skin: Skin is warm and dry.  Psychiatric: She has a normal mood and affect. Her behavior is normal.    ED Course  Procedures (including critical care time)  Medications  HYDROcodone-acetaminophen (NORCO/VICODIN) 5-325 MG per tablet 2 tablet (2 tablets Oral Given 06/07/13 2220)  iohexol (OMNIPAQUE) 300 MG/ML solution 75 mL (75 mLs Intravenous Contrast Given 06/07/13 2302)   DIAGNOSTIC STUDIES: Oxygen Saturation is 97% on room air, normal by my interpretation.    COORDINATION OF CARE: 8:20PM- Will refer patient to an ENT specialist.  Discussed treatment plan with patient at bedside and patient verbalized agreement.   8:45PM- Consulted with Dr. Manus Gunning. Will order a soft tissue CT of the neck to rule out possibility of  mononucleosis. Discussed treatment plan with patient at bedside and patient verbalized agreement.    Labs Review Labs Reviewed  CBC WITH DIFFERENTIAL - Abnormal; Notable for the following:    WBC 10.8 (*)    Monocytes Relative 13 (*)    Monocytes Absolute 1.4 (*)    All other components within normal limits  COMPREHENSIVE METABOLIC PANEL - Abnormal; Notable for the following:    Total Protein 8.4 (*)    GFR calc non Af Amer 86 (*)    All other components within normal limits  MONONUCLEOSIS SCREEN   Imaging Review Ct Soft Tissue Neck W Contrast  06/07/2013   CLINICAL DATA:  Pharyngitis. Dysphagia.  EXAM: CT NECK WITH CONTRAST  TECHNIQUE: Multidetector CT imaging of the neck was performed using the standard protocol following the bolus administration of intravenous contrast.  CONTRAST:  75mL OMNIPAQUE IOHEXOL 300 MG/ML  SOLN  COMPARISON:  None.  FINDINGS: Diffuse enlargement of the tonsillar pillars bilaterally. No masses or fluid collections. Multiple enlarged jugulodigastric lymph nodes bilaterally. The largest include a 2.2 cm short axis node on the right on image number 40 and a 2.4 cm short axis node on the left on image number 42. Minimally enlarged posterior triangle lymph nodes bilaterally. These include a 6 mm short axis node on the left on image number 35. Minimally prominent submandibular lymph nodes, including a 8 mm short axis node on the right on image number 64. Unremarkable bones.  IMPRESSION: 1. Extensive bilateral adenopathy, as described above. Differential considerations include reactive adenopathy, lymphoproliferative disorder such as lymphoma and leukemia, and sarcoidosis. 2. Symmetrically enlarged tonsillar pillars bilaterally without abscess.   Electronically Signed   By: Gordan Payment   On:  06/07/2013 23:29   Patient discussed with Dr. Manus Gunning who also evaluated the patient.   MDM  No diagnosis found. Patient presenting with sore throat.  This is her third visit for the same.  On exam tonsils enlarged bilaterally with exudates.  Uvula midline.  Patient handling secretion.  CT soft tissue neck ordered, which showed extensive bilateral adenopathy and symmetrically enlarged tonsils without abscess.  Feel that the adenopathy is most likely reactive adenopathy.  Patient currently on Clindamycin.  Patient instructed to follow up with PCP.  Patient also given referral to ENT.  Return precautions given.  I personally performed the services described in this documentation, which was scribed in my presence. The recorded information has been reviewed and is accurate.    Pascal Lux [place], PA-C 06/13/13 [phone]

## 2013-06-07 NOTE — ED Notes (Signed)
  Pt transported to ct 

## 2013-06-07 NOTE — ED Notes (Signed)
Pt requesting something for pain. Heather aware.

## 2013-06-08 MED ORDER — HYDROCODONE-ACETAMINOPHEN 7.5-325 MG/15ML PO SOLN: 15.0000 mL | Freq: Four times a day (QID) | ORAL | Status: DC | PRN

## 2013-06-08 MED ORDER — LIDOCAINE VISCOUS 2 % MT SOLN: 15.0000 mL | OROMUCOSAL | Status: DC | PRN

## 2013-06-08 MED ORDER — PREDNISONE 20 MG PO TABS: 60.0000 mg | ORAL_TABLET | Freq: Every day | ORAL | Status: DC

## 2013-06-08 NOTE — ED Notes (Signed)
Pt dc to home. Pt sts understanding to dc instructions. Pt ambulatory to exit without difficulty. Pt denies need for w/.c. Pt dc with family.

## 2013-06-13 NOTE — ED Provider Notes (Signed)
Medical screening examination/treatment/procedure(s) were performed by non-physician practitioner and as supervising physician I was immediately available for consultation/collaboration.   Shakinah Navis, MD 06/13/13 1851 

## 2013-06-18 ENCOUNTER — Encounter: Payer: Self-pay | Admitting: Nurse Practitioner

## 2013-06-18 ENCOUNTER — Ambulatory Visit (INDEPENDENT_AMBULATORY_CARE_PROVIDER_SITE_OTHER): Payer: PRIVATE HEALTH INSURANCE | Admitting: Nurse Practitioner

## 2013-06-18 VITALS — BP 113/73 | HR 105 | Temp 97.6°F | Ht 67.0 in | Wt 268.8 lb

## 2013-06-18 DIAGNOSIS — Z309 Encounter for contraceptive management, unspecified: Secondary | ICD-10-CM

## 2013-06-18 DIAGNOSIS — G43109 Migraine with aura, not intractable, without status migrainosus: Secondary | ICD-10-CM | POA: Insufficient documentation

## 2013-06-18 DIAGNOSIS — E669 Obesity, unspecified: Secondary | ICD-10-CM

## 2013-06-18 DIAGNOSIS — N898 Other specified noninflammatory disorders of vagina: Secondary | ICD-10-CM

## 2013-06-18 DIAGNOSIS — G43009 Migraine without aura, not intractable, without status migrainosus: Secondary | ICD-10-CM

## 2013-06-18 DIAGNOSIS — Z01419 Encounter for gynecological examination (general) (routine) without abnormal findings: Secondary | ICD-10-CM

## 2013-06-18 MED ORDER — NORGESTIMATE-ETH ESTRADIOL 0.25-35 MG-MCG PO TABS: 1.0000 | ORAL_TABLET | Freq: Every day | ORAL | Status: DC

## 2013-06-18 NOTE — Addendum Note (Signed)
Addended by: Kathee Delton on: 06/18/2013 05:07 PM   Modules accepted: Orders

## 2013-06-18 NOTE — Patient Instructions (Signed)
Migraine Headache A migraine headache is an intense, throbbing pain on one or both sides of your head. A migraine can last for 30 minutes to several hours. CAUSES  The exact cause of a migraine headache is not always known. However, a migraine may be caused when nerves in the brain become irritated and release chemicals that cause inflammation. This causes pain. SYMPTOMS  Pain on one or both sides of your head.  Pulsating or throbbing pain.  Severe pain that prevents daily activities.  Pain that is aggravated by any physical activity.  Nausea, vomiting, or both.  Dizziness.  Pain with exposure to bright lights, loud noises, or activity.  General sensitivity to bright lights, loud noises, or smells. Before you get a migraine, you may get warning signs that a migraine is coming (aura). An aura may include:  Seeing flashing lights.  Seeing bright spots, halos, or zig-zag lines.  Having tunnel vision or blurred vision.  Having feelings of numbness or tingling.  Having trouble talking.  Having muscle weakness. MIGRAINE TRIGGERS  Alcohol.  Smoking.  Stress.  Menstruation.  Aged cheeses.  Foods or drinks that contain nitrates, glutamate, aspartame, or tyramine.  Lack of sleep.  Chocolate.  Caffeine.  Hunger.  Physical exertion.  Fatigue.  Medicines used to treat chest pain (nitroglycerine), birth control pills, estrogen, and some blood pressure medicines. DIAGNOSIS  A migraine headache is often diagnosed based on:  Symptoms.  Physical examination.  A CT scan or MRI of your head. TREATMENT Medicines may be given for pain and nausea. Medicines can also be given to help prevent recurrent migraines.  HOME CARE INSTRUCTIONS  Only take over-the-counter or prescription medicines for pain or discomfort as directed by your caregiver. The use of long-term narcotics is not recommended.  Lie down in a dark, quiet room when you have a migraine.  Keep a journal  to find out what may trigger your migraine headaches. For example, write down:  What you eat and drink.  How much sleep you get.  Any change to your diet or medicines.  Limit alcohol consumption.  Quit smoking if you smoke.  Get 7 to 9 hours of sleep, or as recommended by your caregiver.  Limit stress.  Keep lights dim if bright lights bother you and make your migraines worse. SEEK IMMEDIATE MEDICAL CARE IF:   Your migraine becomes severe.  You have a fever.  You have a stiff neck.  You have vision loss.  You have muscular weakness or loss of muscle control.  You start losing your balance or have trouble walking.  You feel faint or pass out.  You have severe symptoms that are different from your first symptoms. MAKE SURE YOU:   Understand these instructions.  Will watch your condition.  Will get help right away if you are not doing well or get worse. Document Released: 09/13/2005 Document Revised: 12/06/2011 Document Reviewed: 09/03/2011 [facility] Patient Information 2014 [place], [place]. Contraception Choices Birth control (contraception) can stop pregnancy from happening. Different types of birth control work in different ways. Some can:  Make the mucus in the cervix thick. This makes it hard for sperm to get into the uterus.  Thin the lining of the uterus. This makes it hard for an egg to attach to the wall of the uterus.  Stop the ovaries from releasing an egg.  Block the sperm from reaching the egg. Certain types of surgery can stop pregnancy from happening. For women, the sugery closes the fallopian tubes (  tubal ligation). For men, the surgery stops sperm from releasing during sex (vasectomy). HORMONAL BIRTH CONTROL Hormonal birth control stops pregnancy by putting hormones into your body. Types of birth control include:  A small tube put under the skin of the upper arm (implant). The tube can stay in place for 3 years.  Shots given every 3  months.  Pills taken every day or once after sex (intercourse).  Patches that are changed once a week.  A ring put into the vagina (vaginal ring). The ring is left in place for 3 weeks and removed for 1 week. Then, a new ring is put in the vagina. BARRIER BIRTH CONTROL  Barrier birth control blocks sperm from reaching the egg. Types of birth control include:   A thin covering worn on the penis (female condom) during sex.  A soft, loose covering put into the vagina (female condom) before sex.  A rubber bowl that sits over the cervix (diaphragm). The bowl must be made for you. The bowl is put into the vagina before sex. The bowl is left in place for 6 to 8 hours after sex.  A small, soft cup that fits over the cervix (cervical cap). The cup must be made for you. The cup can be left in place for 48 hours after sex.  A sponge that is put into the vagina before sex.  A chemical that kills or blocks sperm from getting into the cervix and uterus (spermicide). The chemical may be a cream, jelly, foam, or pill. INTRAUTERINE (IUD) BIRTH CONTROL  IUD birth control is a small, T-shaped piece of plastic. The plastic is put inside the uterus. There are 2 types of IUD:  Copper IUD. The IUD is covered in copper wire. The copper makes a fluid that kills sperm. It can stay in place for 10 years.  Hormone IUD. The hormone stops pregnancy from happening. It can stay in place for 5 years. NATURAL FAMILY PLANNING BIRTH CONTROL  Natural family planning means not having sex or using barrier birth control when the woman is fertile. A woman can:  Use a calendar to keep track of when she is fertile.  Use a thermometer to measure her body temperature. Protect yourself against sexual diseases no matter what type of birth control you use. Talk to your doctor about which type of birth control is best for you. Document Released: 07/11/2009 Document Revised: 12/06/2011 Document Reviewed: 01/20/2011 [facility] Patient  Information 2014 [place], [place].

## 2013-06-18 NOTE — Progress Notes (Signed)
History:  Sandra Wong is a 27 y.o. G1P1001 who presents to clinic today for pap smear and contraception. She is interested in restarting birth control pills. She denies any HTN, blood clotting disorders. She does have migraine without aura.  She is currently having sexual intercourse without condoms.  Complains of vaginal discharge  The following portions of the patient's history were reviewed and updated as appropriate: allergies, current medications, past family history, past medical history, past social history, past surgical history and problem list.  Review of Systems:  Pertinent items are noted in HPI.  Objective:  Physical Exam BP 113/73  Pulse 105  Temp(Src) 97.6 F (36.4 C) (Oral)  Ht 5\' 7"  (1.702 m)  Wt 268 lb 12.8 oz (121.927 kg)  BMI 42.09 kg/m2  LMP 05/16/2013 GENERAL: Well-developed, well-nourished female in no acute distress.  HEENT: Normocephalic, atraumatic.  NECK: Supple. Normal thyroid.  LUNGS: Normal rate. Clear to auscultation bilaterally.  HEART: Regular rate and rhythm with no adventitious sounds.  BREASTS: Symmetric in size. No masses, skin changes, nipple drainage, or lymphadenopathy. ABDOMEN: Soft, nontender, nondistended. No organomegaly. Normal bowel sounds appreciated in all quadrants.  PELVIC: Normal external female genitalia. Vagina is pink and rugated.  Normal discharge. Normal cervix contour. Pap smear obtained. Uterus is normal in size. No adnexal mass or tenderness.  EXTREMITIES: No cyanosis, clubbing, or edema, 2+ distal pulses.   Labs and Imaging   Assessment & Plan:  Assessment:   Yearly pap smear Contraception Migraine without aura Vaginal discharge  Plans: Wet prep/ Pap Smear/ will inform of results Ortho Cyclen BCPs one po qday # 28/ 12 refills Start at next menses. Then use condoms for the first pack   Carolynn Serve, NP 06/18/2013 4:36 PM

## 2013-06-19 LAB — WET PREP, GENITAL
Clue Cells Wet Prep HPF POC: NONE SEEN
Trich, Wet Prep: NONE SEEN

## 2013-07-29 ENCOUNTER — Inpatient Hospital Stay (HOSPITAL_COMMUNITY)
Admission: AD | Admit: 2013-07-29 | Discharge: 2013-07-29 | Disposition: A | Discharge status: Home / Self Care | Payer: Medicaid Other | Source: Ambulatory Visit | Attending: Obstetrics and Gynecology | Admitting: Obstetrics and Gynecology

## 2013-07-29 ENCOUNTER — Encounter (HOSPITAL_COMMUNITY): Payer: Self-pay | Admitting: *Deleted

## 2013-07-29 DIAGNOSIS — Z3201 Encounter for pregnancy test, result positive: Secondary | ICD-10-CM

## 2013-07-29 LAB — POCT PREGNANCY, URINE: Preg Test, Ur: POSITIVE — AB

## 2013-07-29 NOTE — MAU Provider Note (Signed)
Attestation of Attending Supervision of Advanced Practitioner (CNM/NP): Evaluation and management procedures were performed by the Advanced Practitioner under my supervision and collaboration.  I have reviewed the Advanced Practitioner's note and chart, and I agree with the management and plan.  HARRAWAY-SMITH, Yancy Hascall 5:46 PM

## 2013-07-29 NOTE — MAU Note (Signed)
Pt presents for pregnancy test.  

## 2013-07-29 NOTE — MAU Provider Note (Signed)
HPI:  Ms. Sandra Wong is a 27 y.o. female G2P1001 at [redacted]w[redacted]d who presents for a proof of pregnancy. She had two positive home tests. She currently denies any pain or bleeding. She plans to go back to the [facility] office that delivered her last baby.   Objecitve:  GENERAL: Well-developed, well-nourished female in no acute distress.  HEENT: Normocephalic, atraumatic.   LUNGS: Effort normal HEART: Regular rate  SKIN: Warm, dry and without erythema PSYCH: Normal mood and affect  Filed Vitals:   07/29/13 1741  BP: 120/71  Pulse: 79  Temp: 98.2 F (36.8 C)  TempSrc: Oral   Results for orders placed during the hospital encounter of 07/29/13 (from the past 24 hour(s))  POCT PREGNANCY, URINE     Status: Abnormal   Collection Time    07/29/13  5:04 PM      Result Value Range   Preg Test, Ur POSITIVE (*) NEGATIVE      A: Positive urine pregnancy test  P: Discharge home Return to MAU as needed, for any pain and/or vaginal bleeding.  First trimester warning signs discussed  Iona Hansen Maryl Blalock, NP 07/29/2013 5:45 PM

## 2013-08-02 ENCOUNTER — Inpatient Hospital Stay (HOSPITAL_COMMUNITY)
Admission: AD | Admit: 2013-08-02 | Discharge: 2013-08-02 | Disposition: A | Discharge status: Home / Self Care | Payer: Medicaid Other | Source: Ambulatory Visit | Attending: Family Medicine | Admitting: Family Medicine

## 2013-08-02 ENCOUNTER — Encounter (HOSPITAL_COMMUNITY): Payer: Self-pay | Admitting: *Deleted

## 2013-08-02 ENCOUNTER — Inpatient Hospital Stay (HOSPITAL_COMMUNITY): Payer: Medicaid Other

## 2013-08-02 DIAGNOSIS — Z3490 Encounter for supervision of normal pregnancy, unspecified, unspecified trimester: Secondary | ICD-10-CM

## 2013-08-02 DIAGNOSIS — O209 Hemorrhage in early pregnancy, unspecified: Secondary | ICD-10-CM | POA: Insufficient documentation

## 2013-08-02 DIAGNOSIS — Z1389 Encounter for screening for other disorder: Secondary | ICD-10-CM

## 2013-08-02 DIAGNOSIS — R109 Unspecified abdominal pain: Secondary | ICD-10-CM | POA: Insufficient documentation

## 2013-08-02 HISTORY — DX: Anemia, unspecified: D64.9

## 2013-08-02 LAB — URINALYSIS, ROUTINE W REFLEX MICROSCOPIC
Bilirubin Urine: NEGATIVE
Glucose, UA: NEGATIVE mg/dL
Ketones, ur: 15 mg/dL — AB
Nitrite: NEGATIVE
pH: 6 (ref 5.0–8.0)

## 2013-08-02 LAB — URINE MICROSCOPIC-ADD ON

## 2013-08-02 NOTE — MAU Provider Note (Signed)
None     Chief Complaint:  Vaginal Bleeding   Sandra Wong is  27 y.o. G2P1001 at [redacted]w[redacted]d by LMP presents complaining of Vaginal Bleeding She had a "big gush" of red blood this afternoon with subsequent trickeling.  Has not had any PNC yet.  Had a pap 9/22, but did not pick up rx for pills because "I;m not working and couldn't afford them" Obstetrical/Gynecological History: OB History   Grav Para Term Preterm Abortions TAB SAB Ect Mult Living   2 1 1       1      Past Medical History: Past Medical History  Diagnosis Date  . Headache(784.0)   . Anxiety   . Anemia     Past Surgical History: Past Surgical History  Procedure Laterality Date  . No past surgeries      Family History: Family History  Problem Relation Age of Onset  . Hypertension Mother     Social History: History  Substance Use Topics  . Smoking status: Former Smoker -- 0.50 packs/day    Quit date: 06/08/2013  . Smokeless tobacco: Not on file  . Alcohol Use: Yes     Comment: occasional    Allergies:  Allergies  Allergen Reactions  . Other     Has an allergy to some type of eye drop but does not remember the name (in childhood)    Meds:  Prescriptions prior to admission  Medication Sig Dispense Refill  . HYDROcodone-acetaminophen (HYCET) 7.5-325 mg/15 ml solution Take 15 mLs by mouth 4 (four) times daily as needed for pain.  120 mL  0  . ibuprofen (ADVIL,MOTRIN) 200 MG tablet Take 400 mg by mouth every 6 (six) hours as needed for pain.      . Multiple Vitamin (MULTIVITAMIN WITH MINERALS) TABS tablet Take 1 tablet by mouth daily.      . norgestimate-ethinyl estradiol (ORTHO-CYCLEN,SPRINTEC,PREVIFEM) 0.25-35 MG-MCG tablet Take 1 tablet by mouth daily.  1 Package  12    Review of Systems   Constitutional: Negative for fever and chills Eyes: Negative for visual disturbances Respiratory: Negative for shortness of breath, dyspnea Cardiovascular: Negative for chest pain or palpitations   Gastrointestinal: Negative for vomiting, diarrhea and constipation.  Pain in abdomen is mainly when supine at night, feels "bloated" Genitourinary: Negative for dysuria and urgency Musculoskeletal: Negative for back pain, joint pain, myalgias  Neurological: Negative for dizziness and headaches     Physical Exam  Last menstrual period 06/16/2013. (maybe) GENERAL: Well-developed, well-nourished female in no acute distress.  LUNGS: Clear to auscultation bilaterally.  HEART: Regular rate and rhythm. ABDOMEN: Soft, nontender, nondistended, gravid.  EXTREMITIES: Nontender, no edema, 2+ distal pulses. DTR's 2+ SSE:  Small amount dark blood coming from cervical os. Wet prep and GC/CHL collected.  CX firm, closed.  No adnexal tenderness  Labs: No results found for this or any previous visit (from the past 24 hour(s)). Imaging Studies:  U/s ordered   Care turned over to Sandra Wong, CNM  CRESENZO-DISHMAN,FRANCES 11/6/20147:39 PM  US Ob Comp Less 14 Wks  08/02/2013   CLINICAL DATA:  Six weeks pregnant by LMP. Estimated gestational age by LMP is 6 weeks 5 days. Bleeding and pain.  EXAM: OBSTETRIC <14 WK ULTRASOUND  TECHNIQUE: Transabdominal ultrasound was performed for evaluation of the gestation as well as the maternal uterus and adnexal regions.  COMPARISON:  None.  FINDINGS: Intrauterine gestational sac: Visualized/normal in shape.  Yolk sac:  Visualized  Embryo:  Visualized  Cardiac Activity: Visualized  Heart Rate: 120 bpm  CRL:   6.8  mm   6 w 4d                  [place] EDC: 03/16/2014  Maternal uterus/adnexae: No subchorionic hemorrhage is visualized. The right maternal ovary appears within normal limits and contains a corpus luteum. Left ovary is not visualized. No left adnexal mass is seen. No free pelvic fluid is seen.  IMPRESSION: Single living intrauterine pregnancy. Estimated gestational age by ultrasound is concordant with dating back LMP.  Ultrasound for fetal anatomic evaluation  at 18-[redacted] weeks gestational age is recommended.   Electronically Signed   By: Britta Mccreedy M.D.   On: 08/02/2013 20:28    Results for orders placed during the hospital encounter of 08/02/13 (from the past 24 hour(s))  URINALYSIS, ROUTINE W REFLEX MICROSCOPIC     Status: Abnormal   Collection Time    08/02/13  6:45 PM      Result Value Range   Color, Urine YELLOW  YELLOW   APPearance CLEAR  CLEAR   Specific Gravity, Urine 1.025  1.005 - 1.030   pH 6.0  5.0 - 8.0   Glucose, UA NEGATIVE  NEGATIVE mg/dL   Hgb urine dipstick LARGE (*) NEGATIVE   Bilirubin Urine NEGATIVE  NEGATIVE   Ketones, ur 15 (*) NEGATIVE mg/dL   Protein, ur NEGATIVE  NEGATIVE mg/dL   Urobilinogen, UA 1.0  0.0 - 1.0 mg/dL   Nitrite NEGATIVE  NEGATIVE   Leukocytes, UA NEGATIVE  NEGATIVE  URINE MICROSCOPIC-ADD ON     Status: None   Collection Time    08/02/13  6:45 PM      Result Value Range   Squamous Epithelial / LPF RARE  RARE   WBC, UA 0-2  <3 WBC/hpf   RBC / HPF 0-2  <3 RBC/hpf   Bacteria, UA RARE  RARE   Urine-Other MUCOUS PRESENT    ABO/RH     Status: None   Collection Time    08/02/13  7:15 PM      Result Value Range   ABO/RH(D) O POS    HCG, QUANTITATIVE, PREGNANCY     Status: Abnormal   Collection Time    08/02/13  7:15 PM      Result Value Range   hCG, Beta Chain, Quant, S 16109 (*) <5 mIU/mL    A: 1. Normal IUP (intrauterine pregnancy) on prenatal ultrasound     P: D/C home  Pt has appt in [facility] next month Return to MAU as needed   Sharen Counter Certified Nurse-Midwife

## 2013-08-02 NOTE — MAU Note (Signed)
Pt states she had a sudden on set of vaginal bleeding and cramping about 1730

## 2013-08-02 NOTE — MAU Note (Signed)
Pt stated she started having vaginal bleeding and cramping that tstarted this afterrnon.

## 2013-08-03 NOTE — MAU Provider Note (Signed)
Attestation of Attending Supervision of Advanced Practitioner (CNM/NP): Evaluation and management procedures were performed by the Advanced Practitioner under my supervision and collaboration.  I have reviewed the Advanced Practitioner's note and chart, and I agree with the management and plan.  Alistair Senft 08/03/2013 8:05 AM

## 2013-08-22 ENCOUNTER — Ambulatory Visit (INDEPENDENT_AMBULATORY_CARE_PROVIDER_SITE_OTHER): Payer: Medicaid Other | Admitting: Family Medicine

## 2013-08-22 ENCOUNTER — Encounter: Payer: Self-pay | Admitting: Family Medicine

## 2013-08-22 VITALS — BP 114/70 | Temp 97.1°F | Ht 69.0 in | Wt 267.0 lb

## 2013-08-22 DIAGNOSIS — E669 Obesity, unspecified: Secondary | ICD-10-CM

## 2013-08-22 DIAGNOSIS — Z3491 Encounter for supervision of normal pregnancy, unspecified, first trimester: Secondary | ICD-10-CM

## 2013-08-22 LAB — POCT URINALYSIS DIP (DEVICE)
Leukocytes, UA: NEGATIVE
Nitrite: NEGATIVE
Protein, ur: 30 mg/dL — AB
Urobilinogen, UA: 0.2 mg/dL (ref 0.0–1.0)

## 2013-08-22 MED ORDER — PRENATAL VITAMINS 0.8 MG PO TABS: 1.0000 | ORAL_TABLET | Freq: Every day | ORAL | Status: DC

## 2013-08-22 NOTE — Progress Notes (Signed)
Subjective:    Sandra Wong is a G2P1001 [redacted]w[redacted]d being seen today for her first obstetrical visit.  Her obstetrical history is significant for none. Patient does intend to breast feed. Pregnancy history fully reviewed.  Patient reports nausea, no bleeding, no contractions, no cramping and no leaking.  Pt was seen o n11/6 for vaginal bleeding. No recurrence since that time. Otherwise no complaints. Pt did have a pap smear in September and had + yeast. reporst not treated.  Filed Vitals:   08/22/13 0849 08/22/13 0854  BP: 114/70   Temp: 97.1 F (36.2 C)   Height:  5\' 9"  (1.753 m)  Weight: 267 lb (121.11 kg)     HISTORY: OB History  Gravida Para Term Preterm AB SAB TAB Ectopic Multiple Living  2 1 1       1     # Outcome Date GA Lbr Len/2nd Weight Sex Delivery Anes PTL Lv  2 CUR           1 TRM 04/08/08 [redacted]w[redacted]d  7 lb 6 oz (3.345 kg) M SVD EPI       Comments: induced     Past Medical History  Diagnosis Date  . Headache(784.0)   . Anxiety   . Anemia    Past Surgical History  Procedure Laterality Date  . No past surgeries     Family History  Problem Relation Age of Onset  . Hypertension Mother   . Hypertension Paternal Aunt   . Cancer Maternal Grandfather      Exam    Uterus:     Pelvic Exam:    Perineum: No Hemorrhoids, Normal Perineum   Vulva: normal   Vagina:  normal mucosa   pH:    Cervix: no cervical motion tenderness   Adnexa: not evaluated   Bony Pelvis: Normal  System:     Skin: normal coloration and turgor, no rashes    Neurologic: oriented, normal, negative, gait normal; reflexes normal and symmetric   Extremities: normal strength, tone, and muscle mass   HEENT extra ocular movement intact and sclera clear, anicteric   Mouth/Teeth mucous membranes moist, pharynx normal without lesions   Neck supple and no masses   Cardiovascular: regular rate and rhythm, no murmurs or gallops   Respiratory:  appears well, vitals normal, no respiratory  distress, acyanotic, normal RR, ear and throat exam is normal, neck free of mass or lymphadenopathy, chest clear, no wheezing, crepitations, rhonchi, normal symmetric air entry   Abdomen: soft, non-tender; bowel sounds normal; no masses,  no organomegaly   Urinary: urethral meatus normal      Assessment:    Pregnancy: G2P1001 Patient Active Problem List   Diagnosis Date Noted  . Supervision of normal pregnancy in first trimester 08/22/2013  . Contraception management 06/18/2013  . Obesity 06/18/2013  . Migraine without aura 06/18/2013        Plan:     Initial labs drawn. Prenatal vitamins. Problem list reviewed and updated. Genetic Screening discussed Quad Screen: requested.  Ultrasound discussed; fetal survey: requested.  Follow up in 4 weeks. 50% of 30 min visit spent on counseling and coordination of care.    27 y.o. y/o G2P1001 at [redacted]w[redacted]d weeks by L=6, here for New OB visit.   Discussed with Patient: - All new OB labs ordered. (cbc, abo/RH,  GC/C, RPR, Rubella, HBsAg, HIV, Urine Cx) - Physiologic changes of pregnancy/ Safe meds in pregnancy/Diet modifications, [email or web address]. - Routine precautions (SAB, depression, infection s/s).  Patient provided  with all pertinent phone numbers for emergencies. - Review Safety measures: seat belt and proper seatbelt application - Dating [place] ordered; Patient to schedule. -   Quad screen counseling done.  - RTC in 4 weeks for follow up. - Reviewed appropriate weight gain To Do: 1. Labs, [employer], GC/C. No pap today, recent in 9/14  [ ]  Vaccines: Flu:  Recd at work  Tdap:  [ ]  BCM: mirena  Edu: [x ] PTL precautions; [ ]  BF class; [ ]  childbirth class; [ ]   BF counseling  Mckell Riecke RYAN 08/22/2013

## 2013-08-22 NOTE — Addendum Note (Signed)
Addended by: Franchot Mimes on: 08/22/2013 09:31 AM   Modules accepted: Orders

## 2013-08-22 NOTE — Patient Instructions (Signed)
Your BMI is Body mass index is 39.41 kg/(m^2).Marland Kitchen The Academy of Obstetrics and Gynecology have recommendations for weight gain during pregnancy. Below is the recommendations. Please refer to your BMI and help maintain your weight.  BMI: Body mass index is 39.41 kg/(m^2).  Table 1. Institute of Medicine Weight Gain Recommendations for Pregnancy  Prepregnancy Weight  Category Body Mass Index* Recommended  Range of  Total Weight (lb) Recommended Rates of Weight Gain? in the Second and Third Trimesters (lb) (Mean Range [lb/wk])  Underweight Less than 18.5 28-40 1 (1-1.3)  Normal Weight 18.5-24.9 25-35 1 (0.8-1)  Overweight 25-29.9 15-25 0.6 (0.5-0.7)  Obese (includes all classes) 30 and greater 11-20 0.5 (0.4-0.6)  *Body mass index is calculated as weight in kilograms divided by height in meters squared or as weight in pounds multiplied by 703 divided by height in inches.  ?Calculations assume a 1.1-4.4 lb weight gain in the first trimester. Modified from Institute of Medicine ([place]). Weight gain during pregnancy: reexamining the guidelines. [place], [place]. [employer]; 2009. 2009 National Academy of Sciences.  Pregnancy - First Trimester During sexual intercourse, millions of sperm go into the vagina. Only 1 sperm will penetrate and fertilize the female egg while it is in the Fallopian tube. One week later, the fertilized egg implants into the wall of the uterus. An embryo begins to develop into a baby. At 6 to 8 weeks, the eyes and face are formed and the heartbeat can be seen on ultrasound. At the end of 12 weeks (first trimester), all the baby's organs are formed. Now that you are pregnant, you will want to do everything you can to have a healthy baby. Two of the most important things are to get good prenatal care and follow your caregiver's instructions. Prenatal care is all the medical care you receive before the baby's birth. It is given to prevent, find, and treat problems during  the pregnancy and childbirth. PRENATAL EXAMS  During prenatal visits, your weight, blood pressure, and urine are checked. This is done to make sure you are healthy and progressing normally during the pregnancy.  A pregnant woman should gain 25 to 35 pounds during the pregnancy. However, if you are overweight or underweight, your caregiver will advise you regarding your weight.  Your caregiver will ask and answer questions for you.  Blood work, cervical cultures, other necessary tests, and a Pap test are done during your prenatal exams. These tests are done to check on your health and the probable health of your baby. Tests are strongly recommended and done for HIV with your permission. This is the virus that causes AIDS. These tests are done because medicines can be given to help prevent your baby from being born with this infection should you have been infected without knowing it. Blood work is also used to find out your blood type, previous infections, and follow your blood levels (hemoglobin).  Low hemoglobin (anemia) is common during pregnancy. Iron and vitamins are given to help prevent this. Later in the pregnancy, blood tests for diabetes will be done along with any other tests if any problems develop.  You may need other tests to make sure you and the baby are doing well. CHANGES DURING THE FIRST TRIMESTER  Your body goes through many changes during pregnancy. They vary from person to person. Talk to your caregiver about changes you notice and are concerned about. Changes can include:  Your menstrual period stops.  The egg and sperm carry the genes that  determine what you look like. Genes from you and your partner are forming a baby. The female genes determine whether the baby is a boy or a girl.  Your body increases in girth and you may feel bloated.  Feeling sick to your stomach (nauseous) and throwing up (vomiting). If the vomiting is uncontrollable, call your caregiver.  Your  breasts will begin to enlarge and become tender.  Your nipples may stick out more and become darker.  The need to urinate more. Painful urination may mean you have a bladder infection.  Tiring easily.  Loss of appetite.  Cravings for certain kinds of food.  At first, you may gain or lose a couple of pounds.  You may have changes in your emotions from day to day (excited to be pregnant or concerned something may go wrong with the pregnancy and baby).  You may have more vivid and strange dreams. HOME CARE INSTRUCTIONS   It is very important to avoid all smoking, alcohol and non-prescribed drugs during your pregnancy. These affect the formation and growth of the baby. Avoid chemicals while pregnant to ensure the delivery of a healthy infant.  Start your prenatal visits by the 12th week of pregnancy. They are usually scheduled monthly at first, then more often in the last 2 months before delivery. Keep your caregiver's appointments. Follow your caregiver's instructions regarding medicine use, blood and lab tests, exercise, and diet.  During pregnancy, you are providing food for you and your baby. Eat regular, well-balanced meals. Choose foods such as meat, fish, milk and other low fat dairy products, vegetables, fruits, and whole-grain breads and cereals. Your caregiver will tell you of the ideal weight gain.  You can help morning sickness by keeping soda crackers at the bedside. Eat a couple before arising in the morning. You may want to use the crackers without salt on them.  Eating 4 to 5 small meals rather than 3 large meals a day also may help the nausea and vomiting.  Drinking liquids between meals instead of during meals also seems to help nausea and vomiting.  A physical sexual relationship may be continued throughout pregnancy if there are no other problems. Problems may be early (premature) leaking of amniotic fluid from the membranes, vaginal bleeding, or belly (abdominal)  pain.  Exercise regularly if there are no restrictions. Check with your caregiver or physical therapist if you are unsure of the safety of some of your exercises. Greater weight gain will occur in the last 2 trimesters of pregnancy. Exercising will help:  Control your weight.  Keep you in shape.  Prepare you for labor and delivery.  Help you lose your pregnancy weight after you deliver your baby.  Wear a good support or jogging bra for breast tenderness during pregnancy. This may help if worn during sleep too.  Ask when prenatal classes are available. Begin classes when they are offered.  Do not use hot tubs, steam rooms, or saunas.  Wear your seat belt when driving. This protects you and your baby if you are in an accident.  Avoid raw meat, uncooked cheese, cat litter boxes, and soil used by cats throughout the pregnancy. These carry germs that can cause birth defects in the baby.  The first trimester is a good time to visit your dentist for your dental health. Getting your teeth cleaned is okay. Use a softer toothbrush and brush gently during pregnancy.  Ask for help if you have financial, counseling, or nutritional needs during pregnancy. Your  caregiver will be able to offer counseling for these needs as well as refer you for other special needs.  Do not take any medicines or herbs unless told by your caregiver.  Inform your caregiver if there is any mental or physical domestic violence.  Make a list of emergency phone numbers of family, friends, hospital, and police and fire departments.  Write down your questions. Take them to your prenatal visit.  Do not douche.  Do not cross your legs.  If you have to stand for long periods of time, rotate you feet or take small steps in a circle.  You may have more vaginal secretions that may require a sanitary pad. Do not use tampons or scented sanitary pads. MEDICINES AND DRUG USE IN PREGNANCY  Take prenatal vitamins as directed.  The vitamin should contain 1 milligram of folic acid. Keep all vitamins out of reach of children. Only a couple vitamins or tablets containing iron may be fatal to a baby or young child when ingested.  Avoid use of all medicines, including herbs, over-the-counter medicines, not prescribed or suggested by your caregiver. Only take over-the-counter or prescription medicines for pain, discomfort, or fever as directed by your caregiver. Do not use aspirin, ibuprofen, or naproxen unless directed by your caregiver.  Let your caregiver also know about herbs you may be using.  Alcohol is related to a number of birth defects. This includes fetal alcohol syndrome. All alcohol, in any form, should be avoided completely. Smoking will cause low birth rate and premature babies.  Street or illegal drugs are very harmful to the baby. They are absolutely forbidden. A baby born to an addicted mother will be addicted at birth. The baby will go through the same withdrawal an adult does.  Let your caregiver know about any medicines that you have to take and for what reason you take them. SEEK MEDICAL CARE IF:  You have any concerns or worries during your pregnancy. It is better to call with your questions if you feel they cannot wait, rather than worry about them. SEEK IMMEDIATE MEDICAL CARE IF:   An unexplained oral temperature above 102 F (38.9 C) develops, or as your caregiver suggests.  You have leaking of fluid from the vagina (birth canal). If leaking membranes are suspected, take your temperature and inform your caregiver of this when you call.  There is vaginal spotting or bleeding. Notify your caregiver of the amount and how many pads are used.  You develop a bad smelling vaginal discharge with a change in the color.  You continue to feel sick to your stomach (nauseated) and have no relief from remedies suggested. You vomit blood or coffee ground-like materials.  You lose more than 2 pounds of weight  in 1 week.  You gain more than 2 pounds of weight in 1 week and you notice swelling of your face, hands, feet, or legs.  You gain 5 pounds or more in 1 week (even if you do not have swelling of your hands, face, legs, or feet).  You get exposed to [place] measles and have never had them.  You are exposed to fifth disease or chickenpox.  You develop belly (abdominal) pain. Round ligament discomfort is a common non-cancerous (benign) cause of abdominal pain in pregnancy. Your caregiver still must evaluate this.  You develop headache, fever, diarrhea, pain with urination, or shortness of breath.  You fall or are in a car accident or have any kind of trauma.  There is  mental or physical violence in your home. Document Released: 09/07/2001 Document Revised: 06/07/2012 Document Reviewed: 03/11/2009 [employer] Patient Information 2014 [place], [place].

## 2013-08-22 NOTE — Progress Notes (Signed)
Pulse 91 Edema +1 in feet. C/o intermittent sharp pains in pelvic.

## 2013-08-23 LAB — WET PREP, GENITAL: Trich, Wet Prep: NONE SEEN

## 2013-08-23 LAB — PRESCRIPTION MONITORING PROFILE (19 PANEL)
Barbiturate Screen, Urine: NEGATIVE ng/mL
Carisoprodol, Urine: NEGATIVE ng/mL
Cocaine Metabolites: NEGATIVE ng/mL
Creatinine, Urine: 400.65 mg/dL (ref >20.0)
Fentanyl, Ur: NEGATIVE ng/mL
MDMA URINE: NEGATIVE ng/mL
Meperidine, Ur: NEGATIVE ng/mL
Methadone Screen, Urine: NEGATIVE ng/mL
Opiate Screen, Urine: NEGATIVE ng/mL
Oxycodone Screen, Ur: NEGATIVE ng/mL
Tramadol Scrn, Ur: NEGATIVE ng/mL
pH, Initial: 5.9 pH (ref 4.5–8.9)

## 2013-08-23 LAB — ALCOHOL METABOLITE (ETG), URINE: Ethyl Glucuronide (EtG): NEGATIVE ng/mL

## 2013-08-23 LAB — GC/CHLAMYDIA PROBE AMP: GC Probe RNA: NEGATIVE

## 2013-08-25 LAB — CULTURE, OB URINE

## 2013-09-05 ENCOUNTER — Ambulatory Visit (INDEPENDENT_AMBULATORY_CARE_PROVIDER_SITE_OTHER): Payer: Medicaid Other | Admitting: Obstetrics and Gynecology

## 2013-09-05 ENCOUNTER — Encounter: Payer: Self-pay | Admitting: Obstetrics and Gynecology

## 2013-09-05 VITALS — BP 127/79 | Temp 97.1°F | Wt 266.0 lb

## 2013-09-05 DIAGNOSIS — E669 Obesity, unspecified: Secondary | ICD-10-CM

## 2013-09-05 DIAGNOSIS — Z3481 Encounter for supervision of other normal pregnancy, first trimester: Secondary | ICD-10-CM

## 2013-09-05 DIAGNOSIS — Z3491 Encounter for supervision of normal pregnancy, unspecified, first trimester: Secondary | ICD-10-CM

## 2013-09-05 LAB — POCT URINALYSIS DIP (DEVICE)
Hgb urine dipstick: NEGATIVE
Leukocytes, UA: NEGATIVE
Nitrite: NEGATIVE
Protein, ur: NEGATIVE mg/dL
Urobilinogen, UA: 0.2 mg/dL (ref 0.0–1.0)
pH: 6 (ref 5.0–8.0)

## 2013-09-05 LAB — HIV ANTIBODY (ROUTINE TESTING W REFLEX): HIV: NONREACTIVE

## 2013-09-05 NOTE — Patient Instructions (Signed)

## 2013-09-05 NOTE — Progress Notes (Signed)
Nausea much better, declines antiemetic. Early glucola today. Groin pain and LBP still. Sleep helps discussed.

## 2013-09-05 NOTE — Progress Notes (Signed)
Pulse: 69

## 2013-09-07 LAB — HEMOGLOBINOPATHY EVALUATION
Hgb F Quant: 0 % (ref 0.0–2.0)
Hgb S Quant: 0 %

## 2013-09-07 LAB — OBSTETRIC PANEL
Basophils Absolute: 0 10*3/uL (ref 0.0–0.1)
Eosinophils Relative: 4 % (ref 0–5)
Lymphocytes Relative: 30 % (ref 12–46)
Neutro Abs: 4 10*3/uL (ref 1.7–7.7)
Platelets: 223 10*3/uL (ref 150–400)
RDW: 14.7 % (ref 11.5–15.5)
Rubella: 5.65 Index — ABNORMAL HIGH (ref <0.90)
WBC: 6.6 10*3/uL (ref 4.0–10.5)

## 2013-09-14 ENCOUNTER — Encounter: Payer: Self-pay | Admitting: *Deleted

## 2013-09-27 NOTE — L&D Delivery Note (Signed)
Delivery Note At 5:01 AM a viable female was delivered via Vaginal, Spontaneous Delivery in bed.  APGAR: pending, cry shortly after deliver. Infant required intubation for continued support.  Placenta status: Intact, Spontaneous Pathology.  Cord: 3 vessels with the following complications:   Anesthesia: None  Episiotomy: None Lacerations: None Suture Repair: NA Est. Blood Loss (mL): 500  Mom to postpartum.  Baby to NICU.  After prior evaluation pt had made some cervical change up to 5cm. Pt was sleeping when I entered the room, and did not appear to b have severe pain. Increased mag to 3g for tocolysis. Irregular contractions picked up on tocometer and pt was kept on ante during tocolysis attempt. Pt rec;d 50mcg of fent for pain. Shortly after I was called for delivery. NICU in route. Arrived in room and infant delivered. Cord was clamped and cut. Handed to warmer. NICU shortly there and continued resussictation. Delivered intact placenta w/ 3v cord. Afterwards, significant uterine bleeding. IV pit and cytotec administered with aggressive uterine massage. Improvement of uterine bleeding with firm uterus. No tears. EBL 500. Hemostatic.  Sandra Wong 01/03/2014, 5:25 AM

## 2013-10-03 ENCOUNTER — Ambulatory Visit (INDEPENDENT_AMBULATORY_CARE_PROVIDER_SITE_OTHER): Payer: Medicaid Other | Admitting: Obstetrics and Gynecology

## 2013-10-03 ENCOUNTER — Encounter: Payer: Self-pay | Admitting: Obstetrics and Gynecology

## 2013-10-03 VITALS — BP 123/78 | Temp 96.9°F | Wt 262.6 lb

## 2013-10-03 DIAGNOSIS — E669 Obesity, unspecified: Secondary | ICD-10-CM

## 2013-10-03 DIAGNOSIS — O99210 Obesity complicating pregnancy, unspecified trimester: Principal | ICD-10-CM

## 2013-10-03 LAB — POCT URINALYSIS DIP (DEVICE)
GLUCOSE, UA: NEGATIVE mg/dL
HGB URINE DIPSTICK: NEGATIVE
KETONES UR: 15 mg/dL — AB
Leukocytes, UA: NEGATIVE
NITRITE: NEGATIVE
Protein, ur: 30 mg/dL — AB
Urobilinogen, UA: 0.2 mg/dL (ref 0.0–1.0)
pH: 5.5 (ref 5.0–8.0)

## 2013-10-03 NOTE — Patient Instructions (Signed)
Vaginal Bleeding During Pregnancy, Second Trimester  A small amount of bleeding (spotting) is relatively common in pregnancy. It usually stops on its own. There are many causes for bleeding or spotting in pregnancy. Some bleeding may be related to the pregnancy and some may not. Cramping with the bleeding is more serious and concerning. Tell your caregiver if you have any vaginal bleeding.   CAUSES    Infection, inflammation or growths on the cervix.   The placenta may partially or completely be covering the opening of the cervix inside the uterus.   The placenta may have separated from the uterus.   You may be having early/preterm labor.   The cervix is not strong enough to keep a baby inside the uterus (cervical insufficiency).   Many tiny cysts in the uterus instead of pregnancy tissue (molar pregnancy)  SYMPTOMS    Vaginal spotting or bleeding with or without cramps.   Uterine contractions.   Abnormal vaginal discharge.   You may have spotting or spotting after having sexual intercourse.  DIAGNOSIS   To evaluate the pregnancy, your caregiver may:   Do a pelvic exam.   Take blood tests.   Do an ultrasound.  It is very important to follow your caregiver's instructions.   TREATMENT    Evaluation of the pregnancy with blood tests and ultrasound.   Bed rest (getting up to use the bathroom only).   Rho-gam immunization if the mother is Rh negative and the father is Rh positive.   If you are having uterine contractions, you may be given medication to stop the contractions.   If you have cervical insufficiency, you may have a suture placed in the cervix to close it.  HOME CARE INSTRUCTIONS    If your caregiver orders bed rest, you may need to make arrangements for the care of other children and for any other responsibilities. However, your caregiver may allow you to continue light activity.   Keep track of the number of pads you use each day and how soaked (saturated) they are. Write this down.   Do  not use tampons. Do not douche.   Do not have sexual intercourse or orgasms until approved by your physician.   Save any tissue that you pass for your caregiver to see.   Take medicine for cramps only with your caregiver's permission.   Do not take aspirin because it can make you bleed.   Do not exercise, do any strenuous activities or heavy lifting without your caregiver's permission.  SEEK IMMEDIATE MEDICAL CARE IF:    You experience severe cramps in your stomach, back or belly (abdomen).   You have uterine contractions.   You have an oral temperature above 102 F (38.9 C), not controlled by medicine.   You develop chills.   You pass large clots or tissue.   Your bleeding increases or you become light-headed, weak or have fainting episodes.   You have leaking or a gush of fluid from your vagina.  Document Released: 06/23/2005 Document Revised: 12/06/2011 Document Reviewed: 01/02/2009  ExitCare Patient Information 2014 [employer].

## 2013-10-03 NOTE — Progress Notes (Signed)
N/V resolved. RLP resolved. US anatomy scheduled.  Lightheaded occasionally. Eating and drinking well. Hgb 13. Early glucola 71. HR 90s. Discussed slow position changes, presyncope sx.

## 2013-10-03 NOTE — Progress Notes (Signed)
Pulse- 96 Patient reports frequent light headedness and "going numb" when she lays down

## 2013-10-10 ENCOUNTER — Encounter (HOSPITAL_COMMUNITY): Payer: Self-pay | Admitting: *Deleted

## 2013-10-10 ENCOUNTER — Inpatient Hospital Stay (HOSPITAL_COMMUNITY)
Admission: AD | Admit: 2013-10-10 | Discharge: 2013-10-10 | Disposition: A | Discharge status: Home / Self Care | Payer: Medicaid Other | Source: Ambulatory Visit | Attending: Obstetrics and Gynecology | Admitting: Obstetrics and Gynecology

## 2013-10-10 ENCOUNTER — Inpatient Hospital Stay (HOSPITAL_COMMUNITY): Payer: Medicaid Other

## 2013-10-10 DIAGNOSIS — R109 Unspecified abdominal pain: Secondary | ICD-10-CM

## 2013-10-10 DIAGNOSIS — O9989 Other specified diseases and conditions complicating pregnancy, childbirth and the puerperium: Principal | ICD-10-CM

## 2013-10-10 DIAGNOSIS — O26899 Other specified pregnancy related conditions, unspecified trimester: Secondary | ICD-10-CM

## 2013-10-10 DIAGNOSIS — R209 Unspecified disturbances of skin sensation: Secondary | ICD-10-CM | POA: Insufficient documentation

## 2013-10-10 DIAGNOSIS — Z87891 Personal history of nicotine dependence: Secondary | ICD-10-CM | POA: Insufficient documentation

## 2013-10-10 DIAGNOSIS — O99891 Other specified diseases and conditions complicating pregnancy: Secondary | ICD-10-CM | POA: Insufficient documentation

## 2013-10-10 DIAGNOSIS — R202 Paresthesia of skin: Secondary | ICD-10-CM

## 2013-10-10 DIAGNOSIS — N949 Unspecified condition associated with female genital organs and menstrual cycle: Secondary | ICD-10-CM | POA: Insufficient documentation

## 2013-10-10 LAB — URINALYSIS, ROUTINE W REFLEX MICROSCOPIC
Bilirubin Urine: NEGATIVE
Glucose, UA: NEGATIVE mg/dL
HGB URINE DIPSTICK: NEGATIVE
Ketones, ur: 15 mg/dL — AB
Leukocytes, UA: NEGATIVE
Nitrite: NEGATIVE
PH: 5.5 (ref 5.0–8.0)
Protein, ur: NEGATIVE mg/dL
SPECIFIC GRAVITY, URINE: 1.025 (ref 1.005–1.030)
UROBILINOGEN UA: 0.2 mg/dL (ref 0.0–1.0)

## 2013-10-10 NOTE — MAU Provider Note (Signed)
History     CSN: [id number]  Arrival date and time: 10/10/13 1153   First Provider Initiated Contact with Patient 10/10/13 1448      Chief Complaint  Patient presents with  . Abdominal Pain   HPI  Sandra Wong is a 28 yo, G2P1001 at 9776w4d who presents to the MAU for pelvic pain x 3 days.  Pt reports the pain is constant, sharp, originates in her vagina, radiates to the lower back and is relieved with rest.  She reports associated polyuria and numb feeling down both legs.  States the numb feeling down both legs has occurred once before occurring last night and lasted 10-15 minutes.  She denies any trauma and has been getting prenatal care regularly.    OB History   Grav Para Term Preterm Abortions TAB SAB Ect Mult Living   2 1 1       1       Past Medical History  Diagnosis Date  . Headache(784.0)   . Anxiety   . Anemia     Past Surgical History  Procedure Laterality Date  . No past surgeries      Family History  Problem Relation Age of Onset  . Hypertension Mother   . Hypertension Paternal Aunt   . Cancer Maternal Grandfather     History  Substance Use Topics  . Smoking status: Former Smoker -- 0.50 packs/day    Quit date: 06/08/2013  . Smokeless tobacco: Never Used  . Alcohol Use: No     Comment: occasional    Allergies:  Allergies  Allergen Reactions  . Other     Has an allergy to some type of eye drop but does not remember the name (in childhood)    Prescriptions prior to admission  Medication Sig Dispense Refill  . Prenatal Multivit-Min-Fe-FA (PRENATAL VITAMINS) 0.8 MG tablet Take 1 tablet by mouth daily.  30 tablet  12    Review of Systems  Constitutional: Negative for fever and chills.  Gastrointestinal: Negative for nausea, vomiting, abdominal pain, diarrhea and constipation.  Genitourinary: Positive for frequency. Negative for dysuria, urgency and hematuria.   Physical Exam   Blood pressure 134/70, pulse 86, temperature 98.4 F  (36.9 C), temperature source Oral, resp. rate 16, height 5\' 7"  (1.702 m), weight 120.657 kg (266 lb), last menstrual period 06/16/2013.  Physical Exam  Constitutional: She appears well-developed and well-nourished.  HENT:  Head: Normocephalic.  GI: Soft. She exhibits no distension. There is no tenderness. There is no rebound and no guarding.  Neurological: She is alert.   FHT present by doppler per RN  Results for orders placed during the hospital encounter of 10/10/13 (from the past 336 hour(s))  URINALYSIS, ROUTINE W REFLEX MICROSCOPIC   Collection Time    10/10/13 12:45 PM      Result Value Range   Color, Urine YELLOW  YELLOW   APPearance CLEAR  CLEAR   Specific Gravity, Urine 1.025  1.005 - 1.030   pH 5.5  5.0 - 8.0   Glucose, UA NEGATIVE  NEGATIVE mg/dL   Hgb urine dipstick NEGATIVE  NEGATIVE   Bilirubin Urine NEGATIVE  NEGATIVE   Ketones, ur 15 (*) NEGATIVE mg/dL   Protein, ur NEGATIVE  NEGATIVE mg/dL   Urobilinogen, UA 0.2  0.0 - 1.0 mg/dL   Nitrite NEGATIVE  NEGATIVE   Leukocytes, UA NEGATIVE  NEGATIVE      MAU Course  Procedures  US OB limited  Impression:  Single IUP at 16  4/7 wks Limited US due to abdominal pain Anterior placenta without previa No sonographic findings suggestive of placental abruption Normal amniotic fluid level   MDM  Urinalysis    Assessment and Plan   Assessment:  #Abdominal pain in pregnancy-Differential is broad for this particular symptom but includes placental abruption and previa, infection such as UTI and normal symptoms of pregnancy such as round ligament pain.  US showed no evidence of placental previa or abruption making those diagnoses unlikely.  The patient's urinalysis was negative and although a wet prep and GC/Chlam was not obtained due to low suspicion from clinical presentation, infection is also unlikely.  With the exclusion of other possible etiologies, a diagnosis of normal pregnancy symptoms such as round  ligament can be the diagnosis.    #Bilateral leg numbness/weakness-The event described by the patient seems to be an isolated one.  The patient was sitting down for at least 30 minutes in the same position possibly impinging a nerve causing the weakness and numbness and was relieved shortly after changing position.  Lumbosacral radiculopathy should be considered.  Follow up with neurologist if symptoms persist.  Plan:  1. Discharge in stable condition 2. Follow up with Obstetrician as directed. 3. Take Tylenol for pain as needed. 4. Return to the MAU if symptoms persist or worsen   Toilolo, Tifi 10/10/2013, 3:11 PM   I have seen this patient and agree with the above PA student's note.  LEFTWICH-KIRBY, LISA Certified Nurse-Midwife

## 2013-10-10 NOTE — Discharge Instructions (Signed)
Abdominal Pain During Pregnancy °Abdominal pain is common in pregnancy. Most of the time, it does not cause harm. There are many causes of abdominal pain. Some causes are more serious than others. Some of the causes of abdominal pain in pregnancy are easily diagnosed. Occasionally, the diagnosis takes time to understand. Other times, the cause is not determined. Abdominal pain can be a sign that something is very wrong with the pregnancy, or the pain may have nothing to do with the pregnancy at all. For this reason, always tell your health care provider if you have any abdominal discomfort. °HOME CARE INSTRUCTIONS  °Monitor your abdominal pain for any changes. The following actions may help to alleviate any discomfort you are experiencing: °· Do not have sexual intercourse or put anything in your vagina until your symptoms go away completely. °· Get plenty of rest until your pain improves. °· Drink clear fluids if you feel nauseous. Avoid solid food as long as you are uncomfortable or nauseous. °· Only take over-the-counter or prescription medicine as directed by your health care provider. °· Keep all follow-up appointments with your health care provider. °SEEK IMMEDIATE MEDICAL CARE IF: °· You are bleeding, leaking fluid, or passing tissue from the vagina. °· You have increasing pain or cramping. °· You have persistent vomiting. °· You have painful or bloody urination. °· You have a fever. °· You notice a decrease in your baby's movements. °· You have extreme weakness or feel faint. °· You have shortness of breath, with or without abdominal pain. °· You develop a severe headache with abdominal pain. °· You have abnormal vaginal discharge with abdominal pain. °· You have persistent diarrhea. °· You have abdominal pain that continues even after rest, or gets worse. °MAKE SURE YOU:  °· Understand these instructions. °· Will watch your condition. °· Will get help right away if you are not doing well or get  worse. °Document Released: 09/13/2005 Document Revised: 07/04/2013 Document Reviewed: 04/12/2013 °ExitCare® Patient Information ©2014 [employer]. ° °

## 2013-10-10 NOTE — MAU Note (Signed)
Pt presents with complaints of lower abdominal pain for 3 days. Denies any vaginal bleeding, states she has been voiding frequently.

## 2013-10-17 ENCOUNTER — Encounter (HOSPITAL_COMMUNITY): Payer: Self-pay | Admitting: *Deleted

## 2013-10-18 NOTE — MAU Provider Note (Signed)
Attestation of Attending Supervision of Advanced Practitioner (CNM/NP): Evaluation and management procedures were performed by the Advanced Practitioner under my supervision and collaboration.  I have reviewed the Advanced Practitioner's note and chart, and I agree with the management and plan.  Marria Mathison 10/18/2013 8:37 AM

## 2013-10-23 ENCOUNTER — Encounter: Payer: Self-pay | Admitting: Obstetrics & Gynecology

## 2013-10-31 ENCOUNTER — Other Ambulatory Visit: Payer: Self-pay

## 2013-10-31 ENCOUNTER — Ambulatory Visit (HOSPITAL_COMMUNITY)
Admission: RE | Admit: 2013-10-31 | Discharge: 2013-10-31 | Disposition: A | Discharge status: Home / Self Care | Payer: Medicaid Other | Source: Ambulatory Visit | Attending: Obstetrics and Gynecology | Admitting: Obstetrics and Gynecology

## 2013-10-31 ENCOUNTER — Ambulatory Visit (INDEPENDENT_AMBULATORY_CARE_PROVIDER_SITE_OTHER): Payer: Medicaid Other | Admitting: Family Medicine

## 2013-10-31 VITALS — BP 116/70 | Temp 97.1°F | Wt 265.6 lb

## 2013-10-31 DIAGNOSIS — R209 Unspecified disturbances of skin sensation: Secondary | ICD-10-CM

## 2013-10-31 DIAGNOSIS — Z3491 Encounter for supervision of normal pregnancy, unspecified, first trimester: Secondary | ICD-10-CM

## 2013-10-31 DIAGNOSIS — O99210 Obesity complicating pregnancy, unspecified trimester: Secondary | ICD-10-CM

## 2013-10-31 DIAGNOSIS — Z23 Encounter for immunization: Secondary | ICD-10-CM

## 2013-10-31 DIAGNOSIS — R202 Paresthesia of skin: Secondary | ICD-10-CM

## 2013-10-31 DIAGNOSIS — Z3480 Encounter for supervision of other normal pregnancy, unspecified trimester: Secondary | ICD-10-CM

## 2013-10-31 DIAGNOSIS — O358XX0 Maternal care for other (suspected) fetal abnormality and damage, not applicable or unspecified: Secondary | ICD-10-CM | POA: Insufficient documentation

## 2013-10-31 DIAGNOSIS — O283 Abnormal ultrasonic finding on antenatal screening of mother: Secondary | ICD-10-CM

## 2013-10-31 DIAGNOSIS — E669 Obesity, unspecified: Secondary | ICD-10-CM

## 2013-10-31 DIAGNOSIS — B354 Tinea corporis: Secondary | ICD-10-CM

## 2013-10-31 DIAGNOSIS — Z1389 Encounter for screening for other disorder: Secondary | ICD-10-CM | POA: Insufficient documentation

## 2013-10-31 DIAGNOSIS — Z363 Encounter for antenatal screening for malformations: Secondary | ICD-10-CM | POA: Insufficient documentation

## 2013-10-31 LAB — POCT URINALYSIS DIP (DEVICE)
BILIRUBIN URINE: NEGATIVE
Glucose, UA: NEGATIVE mg/dL
Hgb urine dipstick: NEGATIVE
KETONES UR: 40 mg/dL — AB
Leukocytes, UA: NEGATIVE
Nitrite: NEGATIVE
Protein, ur: NEGATIVE mg/dL
Specific Gravity, Urine: 1.025 (ref 1.005–1.030)
Urobilinogen, UA: 0.2 mg/dL (ref 0.0–1.0)
pH: 6 (ref 5.0–8.0)

## 2013-10-31 MED ORDER — CLOTRIMAZOLE 1 % EX CREA: 1.0000 "application " | TOPICAL_CREAM | Freq: Two times a day (BID) | CUTANEOUS | Status: DC

## 2013-10-31 NOTE — Progress Notes (Signed)
P=92  Pt  Complains of spots on breast/chest and upper back.

## 2013-10-31 NOTE — Patient Instructions (Signed)
Body Ringworm °Ringworm (tinea corporis) is a fungal infection of the skin on the body. This infection is not caused by worms, but is actually caused by a fungus. Fungus normally lives on the top of your skin and can be useful. However, in the case of ringworms, the fungus grows out of control and causes a skin infection. It can involve any area of skin on the body and can spread easily from one person to another (contagious). Ringworm is a common problem for children, but it can affect adults as well. Ringworm is also often found in athletes, especially wrestlers who share equipment and mats.  °CAUSES  °Ringworm of the body is caused by a fungus called dermatophyte. It can spread by: °· Touching other people who are infected. °· Touching infected pets. °· Touching or sharing objects that have been in contact with the infected person or pet (hats, combs, towels, clothing, sports equipment). °SYMPTOMS  °· Itchy, raised red spots and bumps on the skin. °· Ring-shaped rash. °· Redness near the border of the rash with a clear center. °· Dry and scaly skin on or around the rash. °Not every person develops a ring-shaped rash. Some develop only the red, scaly patches. °DIAGNOSIS  °Most often, ringworm can be diagnosed by performing a skin exam. Your caregiver may choose to take a skin scraping from the affected area. The sample will be examined under the microscope to see if the fungus is present.  °TREATMENT  °Body ringworm may be treated with a topical antifungal cream or ointment. Sometimes, an antifungal shampoo that can be used on your body is prescribed. You may be prescribed antifungal medicines to take by mouth if your ringworm is severe, keeps coming back, or lasts a long time.  °HOME CARE INSTRUCTIONS  °· Only take over-the-counter or prescription medicines as directed by your caregiver. °· Wash the infected area and dry it completely before applying your cream or ointment. °· When using antifungal shampoo to  treat the ringworm, leave the shampoo on the body for 3 5 minutes before rinsing.    °· Wear loose clothing to stop clothes from rubbing and irritating the rash. °· Wash or change your bed sheets every night while you have the rash. °· Have your pet treated by your veterinarian if it has the same infection. °To prevent ringworm:  °· Practice good hygiene. °· Wear sandals or shoes in public places and showers. °· Do not share personal items with others. °· Avoid touching red patches of skin on other people. °· Avoid touching pets that have bald spots or wash your hands after doing so. °SEEK MEDICAL CARE IF:  °· Your rash continues to spread after 7 days of treatment. °· Your rash is not gone in 4 weeks. °· The area around your rash becomes red, warm, tender, and swollen. °Document Released: 09/10/2000 Document Revised: 06/07/2012 Document Reviewed: 03/27/2012 °ExitCare® Patient Information ©2014 [employer]. ° °

## 2013-10-31 NOTE — Addendum Note (Signed)
Addended by: Faythe CasaBELLAMY, JEANETTA M on: 10/31/2013 01:13 PM   Modules accepted: Orders

## 2013-10-31 NOTE — Progress Notes (Signed)
S: 28 yo G2P1001 @ [redacted]w[redacted]d  - doing well - US done today - has itchy areas on breast that are bothering her. Son diagnosed with ringworm  - No VB  O: see flowsheet.  - two scaly patches above left breast with fine scale and central clearing.    A/p - ringworm- treat with clotrimazole - pregnancy going well  - f/u in 4 weeks - will need to review US at that time also with her as not resulted today.

## 2013-11-01 LAB — TOXOPLASMA ANTIBODIES- IGG AND  IGM: Toxoplasma Antibody- IgM: <3 IU/mL (ref <8.0)

## 2013-11-01 LAB — CMV ANTIBODY, IGG (EIA): CMV Ab - IgG: 9.5 U/mL — ABNORMAL HIGH (ref <0.60)

## 2013-11-01 LAB — CMV IGM: CMV IgM: <8 AU/mL (ref <30.00)

## 2013-11-13 ENCOUNTER — Telehealth (HOSPITAL_COMMUNITY): Payer: Self-pay | Admitting: Maternal and Fetal Medicine

## 2013-11-13 NOTE — Telephone Encounter (Signed)
Called Sandra Wong to review the results of her [employer] (cell free DNA testing).  There was no answer, I left a message for her to return my call. Mady Gemmaaragh Conrad, MS Certified Genetic Counselor

## 2013-11-16 ENCOUNTER — Telehealth (HOSPITAL_COMMUNITY): Payer: Self-pay | Admitting: Maternal and Fetal Medicine

## 2013-11-16 NOTE — Telephone Encounter (Signed)
Called [name] to review the results of her cell free DNA testing.  She answered the phone and indicated that she was [name].  When I stated who I was and where I was calling from she hung up on me. She did not answer on return call.

## 2013-11-23 ENCOUNTER — Inpatient Hospital Stay (HOSPITAL_COMMUNITY)
Admission: AD | Admit: 2013-11-23 | Discharge: 2013-11-23 | Disposition: A | Discharge status: Home / Self Care | Payer: Medicaid Other | Source: Ambulatory Visit | Attending: Obstetrics & Gynecology | Admitting: Obstetrics & Gynecology

## 2013-11-23 ENCOUNTER — Encounter (HOSPITAL_COMMUNITY): Payer: Self-pay | Admitting: *Deleted

## 2013-11-23 DIAGNOSIS — O99210 Obesity complicating pregnancy, unspecified trimester: Secondary | ICD-10-CM

## 2013-11-23 DIAGNOSIS — M545 Low back pain, unspecified: Secondary | ICD-10-CM | POA: Insufficient documentation

## 2013-11-23 DIAGNOSIS — Z3491 Encounter for supervision of normal pregnancy, unspecified, first trimester: Secondary | ICD-10-CM

## 2013-11-23 DIAGNOSIS — O9989 Other specified diseases and conditions complicating pregnancy, childbirth and the puerperium: Principal | ICD-10-CM

## 2013-11-23 DIAGNOSIS — Z87891 Personal history of nicotine dependence: Secondary | ICD-10-CM | POA: Insufficient documentation

## 2013-11-23 DIAGNOSIS — R109 Unspecified abdominal pain: Secondary | ICD-10-CM

## 2013-11-23 DIAGNOSIS — M62830 Muscle spasm of back: Secondary | ICD-10-CM

## 2013-11-23 DIAGNOSIS — E669 Obesity, unspecified: Secondary | ICD-10-CM

## 2013-11-23 DIAGNOSIS — R1011 Right upper quadrant pain: Secondary | ICD-10-CM | POA: Insufficient documentation

## 2013-11-23 DIAGNOSIS — O99891 Other specified diseases and conditions complicating pregnancy: Secondary | ICD-10-CM | POA: Insufficient documentation

## 2013-11-23 DIAGNOSIS — M5380 Other specified dorsopathies, site unspecified: Secondary | ICD-10-CM

## 2013-11-23 LAB — URINALYSIS, ROUTINE W REFLEX MICROSCOPIC
Bilirubin Urine: NEGATIVE
Glucose, UA: NEGATIVE mg/dL
Hgb urine dipstick: NEGATIVE
KETONES UR: NEGATIVE mg/dL
Leukocytes, UA: NEGATIVE
NITRITE: NEGATIVE
PH: 6 (ref 5.0–8.0)
Protein, ur: NEGATIVE mg/dL
Specific Gravity, Urine: >1.03 — ABNORMAL HIGH (ref 1.005–1.030)
UROBILINOGEN UA: 0.2 mg/dL (ref 0.0–1.0)

## 2013-11-23 LAB — COMPREHENSIVE METABOLIC PANEL
ALBUMIN: 2.6 g/dL — AB (ref 3.5–5.2)
ALK PHOS: 43 U/L (ref 39–117)
ALT: 13 U/L (ref 0–35)
AST: 9 U/L (ref 0–37)
BUN: 7 mg/dL (ref 6–23)
CHLORIDE: 101 meq/L (ref 96–112)
CO2: 25 mEq/L (ref 19–32)
Calcium: 8.8 mg/dL (ref 8.4–10.5)
Creatinine, Ser: 0.59 mg/dL (ref 0.50–1.10)
GFR calc Af Amer: >90 mL/min (ref >90)
GFR calc non Af Amer: >90 mL/min (ref >90)
Glucose, Bld: 70 mg/dL (ref 70–99)
POTASSIUM: 4.1 meq/L (ref 3.7–5.3)
SODIUM: 136 meq/L — AB (ref 137–147)
TOTAL PROTEIN: 6.3 g/dL (ref 6.0–8.3)

## 2013-11-23 LAB — CBC WITH DIFFERENTIAL/PLATELET
BASOS PCT: 0 % (ref 0–1)
Basophils Absolute: 0 10*3/uL (ref 0.0–0.1)
Eosinophils Absolute: 0.3 10*3/uL (ref 0.0–0.7)
Eosinophils Relative: 3 % (ref 0–5)
HCT: 31.5 % — ABNORMAL LOW (ref 36.0–46.0)
HEMOGLOBIN: 10.5 g/dL — AB (ref 12.0–15.0)
Lymphocytes Relative: 26 % (ref 12–46)
Lymphs Abs: 2.2 10*3/uL (ref 0.7–4.0)
MCH: 26.9 pg (ref 26.0–34.0)
MCHC: 33.3 g/dL (ref 30.0–36.0)
MCV: 80.6 fL (ref 78.0–100.0)
Monocytes Absolute: 0.6 10*3/uL (ref 0.1–1.0)
Monocytes Relative: 8 % (ref 3–12)
NEUTROS ABS: 5.2 10*3/uL (ref 1.7–7.7)
NEUTROS PCT: 63 % (ref 43–77)
PLATELETS: 151 10*3/uL (ref 150–400)
RBC: 3.91 MIL/uL (ref 3.87–5.11)
RDW: 15 % (ref 11.5–15.5)
WBC: 8.3 10*3/uL (ref 4.0–10.5)

## 2013-11-23 MED ORDER — GI COCKTAIL ~~LOC~~: 30.0000 mL | Freq: Once | ORAL | Status: DC

## 2013-11-23 MED ORDER — CYCLOBENZAPRINE HCL 10 MG PO TABS: 10.0000 mg | ORAL_TABLET | Freq: Three times a day (TID) | ORAL | Status: DC | PRN

## 2013-11-23 NOTE — Discharge Instructions (Signed)

## 2013-11-23 NOTE — MAU Note (Signed)
Patient states she has been having upper abdominal pain and lower back pain off and on for 3 days, more steady now. Denies bleeding or leaking. Reports fetal movement.

## 2013-11-23 NOTE — MAU Provider Note (Signed)
Chief Complaint:  Abdominal and back pain  Raelene Trew is a 28 y.o.  G2P1001 with IUP at [redacted]w[redacted]d presenting for Upper abdominal pain and lower back pain. She reports that the abdominal pain is constant sharp pain in the RUQ and Epigastric area and has been getting progressively worse for the past 3 days. She also endorses lower back pain that is "always there" but getting worse with the abdominal pain. She denies any fever, chills, HA, vision changes, N/V/D, dysuria or LE edema. She denies LOF, VB or contractions and notes continued fetal movement.    Menstrual History: OB History   Grav Para Term Preterm Abortions TAB SAB Ect Mult Living   2 1 1       1       Patient's last menstrual period was 06/16/2013.     Past Medical History  Diagnosis Date  . Headache(784.0)   . Anxiety   . Anemia     Past Surgical History  Procedure Laterality Date  . No past surgeries      Family History  Problem Relation Age of Onset  . Hypertension Mother   . Hypertension Paternal Aunt   . Cancer Maternal Grandfather     History  Substance Use Topics  . Smoking status: Former Smoker -- 0.50 packs/day    Quit date: 06/08/2013  . Smokeless tobacco: Never Used  . Alcohol Use: No     Comment: occasional     Allergies  Allergen Reactions  . Other     Has an allergy to some type of eye drop but does not remember the name (in childhood)   No prescriptions prior to admission   Review of Systems - Negative except for what is mentioned in HPI.  Physical Exam  Blood pressure 105/54, pulse 89, temperature 98.9 F (37.2 C), temperature source Oral, resp. rate 22, height 5\' 8"  (1.727 m), weight 121.473 kg (267 lb 12.8 oz), last menstrual period 06/16/2013, SpO2 100.00%. GENERAL: Well-developed, well-nourished female in no acute distress.  LUNGS: Clear to auscultation bilaterally.  HEART: Regular rate and rhythm. ABDOMEN: Soft, mild RUQ and epigastric tenderness, NO rebounding or guarding;  Gravid.  BACK: No CVA tenderness, lumbar paraspinal tenderness w/o spinal process tenderness EXTREMITIES: Nontender, no edema, 2+ distal pulses. Fetal heart rate 145   Labs: Results for orders placed during the hospital encounter of 11/23/13 (from the past 24 hour(s))  URINALYSIS, ROUTINE W REFLEX MICROSCOPIC   Collection Time    11/23/13  4:24 PM      Result Value Ref Range   Color, Urine YELLOW  YELLOW   APPearance CLEAR  CLEAR   Specific Gravity, Urine >1.030 (*) 1.005 - 1.030   pH 6.0  5.0 - 8.0   Glucose, UA NEGATIVE  NEGATIVE mg/dL   Hgb urine dipstick NEGATIVE  NEGATIVE   Bilirubin Urine NEGATIVE  NEGATIVE   Ketones, ur NEGATIVE  NEGATIVE mg/dL   Protein, ur NEGATIVE  NEGATIVE mg/dL   Urobilinogen, UA 0.2  0.0 - 1.0 mg/dL   Nitrite NEGATIVE  NEGATIVE   Leukocytes, UA NEGATIVE  NEGATIVE  CBC WITH DIFFERENTIAL   Collection Time    11/23/13  5:50 PM      Result Value Ref Range   WBC 8.3  4.0 - 10.5 K/uL   RBC 3.91  3.87 - 5.11 MIL/uL   Hemoglobin 10.5 (*) 12.0 - 15.0 g/dL   HCT 16.1 (*) 09.6 - 04.5 %   MCV 80.6  78.0 - 100.0 fL  MCH 26.9  26.0 - 34.0 pg   MCHC 33.3  30.0 - 36.0 g/dL   RDW 11.915.0  14.711.5 - 82.915.5 %   Platelets 151  150 - 400 K/uL   Neutrophils Relative % 63  43 - 77 %   Neutro Abs 5.2  1.7 - 7.7 K/uL   Lymphocytes Relative 26  12 - 46 %   Lymphs Abs 2.2  0.7 - 4.0 K/uL   Monocytes Relative 8  3 - 12 %   Monocytes Absolute 0.6  0.1 - 1.0 K/uL   Eosinophils Relative 3  0 - 5 %   Eosinophils Absolute 0.3  0.0 - 0.7 K/uL   Basophils Relative 0  0 - 1 %   Basophils Absolute 0.0  0.0 - 0.1 K/uL  COMPREHENSIVE METABOLIC PANEL   Collection Time    11/23/13  5:50 PM      Result Value Ref Range   Sodium 136 (*) 137 - 147 mEq/L   Potassium 4.1  3.7 - 5.3 mEq/L   Chloride 101  96 - 112 mEq/L   CO2 25  19 - 32 mEq/L   Glucose, Bld 70  70 - 99 mg/dL   BUN 7  6 - 23 mg/dL   Creatinine, Ser 5.620.59  0.50 - 1.10 mg/dL   Calcium 8.8  8.4 - 13.010.5 mg/dL   Total  Protein 6.3  6.0 - 8.3 g/dL   Albumin 2.6 (*) 3.5 - 5.2 g/dL   AST 9  0 - 37 U/L   ALT 13  0 - 35 U/L   Alkaline Phosphatase 43  39 - 117 U/L   Total Bilirubin <0.2 (*) 0.3 - 1.2 mg/dL   GFR calc non Af Amer >90  >90 mL/min   GFR calc Af Amer >90  >90 mL/min    Imaging Studies:  [place] Ob Detail   Assessment: Nira RetortShanikwa Merwin is  28 y.o. G2P1001 at 4848w6d presents with Abdominal Pain and Back Pain No signs or infection, Afeb, WBC wnl, and UA negative for UTI. CBC and CMP wnl.   Plan: Flexeril for MSK pain Continue Tylenol   Wenda LowJoyner, James 2/27/20156:50 PM  I have seen and examined this patient and agree with above documentation in the resident's note. [age] yo G2P1001 @ 3448w6d who presents with abdominal pain and back pain. The abdominal pain had resolved by the time of my exam. States she has had intermittent sharp stabbing pain that wraps around her right rib cage and to her epigastrium that "grabs" her and keeps her from standing upright. On exam, pain reproducible with mild palpation of the intercostal spaces. Back pain is all lower back and heat helps.  Paraspinal tenderness bilaterally.  MSK etiology likely. rx of flexeril. Labs done prior to my exam and unremarkable.  +FHT on doppler.   D/c to home. F/u as scheduled in clinic.   Rulon AbideKeli Tom Macpherson, M.D. [facility] Fellow 11/23/2013 7:47 PM

## 2013-11-28 ENCOUNTER — Encounter: Payer: Medicaid Other | Admitting: Obstetrics and Gynecology

## 2013-11-28 ENCOUNTER — Ambulatory Visit (HOSPITAL_COMMUNITY): Payer: Medicaid Other

## 2013-12-04 ENCOUNTER — Other Ambulatory Visit: Payer: Self-pay | Admitting: Advanced Practice Midwife

## 2013-12-04 DIAGNOSIS — O358XX0 Maternal care for other (suspected) fetal abnormality and damage, not applicable or unspecified: Secondary | ICD-10-CM

## 2013-12-05 ENCOUNTER — Encounter: Payer: Medicaid Other | Admitting: Advanced Practice Midwife

## 2013-12-06 ENCOUNTER — Ambulatory Visit (HOSPITAL_COMMUNITY)
Admission: RE | Admit: 2013-12-06 | Discharge: 2013-12-06 | Disposition: A | Discharge status: Home / Self Care | Payer: Medicaid Other | Source: Ambulatory Visit | Attending: Family Medicine | Admitting: Family Medicine

## 2013-12-06 VITALS — BP 114/65 | HR 92 | Wt 273.0 lb

## 2013-12-06 DIAGNOSIS — O283 Abnormal ultrasonic finding on antenatal screening of mother: Secondary | ICD-10-CM

## 2013-12-06 DIAGNOSIS — O358XX0 Maternal care for other (suspected) fetal abnormality and damage, not applicable or unspecified: Secondary | ICD-10-CM

## 2013-12-18 ENCOUNTER — Ambulatory Visit (INDEPENDENT_AMBULATORY_CARE_PROVIDER_SITE_OTHER): Payer: Medicaid Other | Admitting: Family Medicine

## 2013-12-18 ENCOUNTER — Encounter: Payer: Self-pay | Admitting: Family Medicine

## 2013-12-18 VITALS — BP 120/60 | Temp 97.6°F | Wt 267.9 lb

## 2013-12-18 DIAGNOSIS — O99210 Obesity complicating pregnancy, unspecified trimester: Principal | ICD-10-CM

## 2013-12-18 DIAGNOSIS — E669 Obesity, unspecified: Secondary | ICD-10-CM

## 2013-12-18 DIAGNOSIS — Z3480 Encounter for supervision of other normal pregnancy, unspecified trimester: Secondary | ICD-10-CM

## 2013-12-18 DIAGNOSIS — Z3491 Encounter for supervision of normal pregnancy, unspecified, first trimester: Secondary | ICD-10-CM

## 2013-12-18 LAB — POCT URINALYSIS DIP (DEVICE)
Bilirubin Urine: NEGATIVE
GLUCOSE, UA: NEGATIVE mg/dL
HGB URINE DIPSTICK: NEGATIVE
Ketones, ur: 15 mg/dL — AB
NITRITE: NEGATIVE
PH: 6 (ref 5.0–8.0)
PROTEIN: NEGATIVE mg/dL
UROBILINOGEN UA: 0.2 mg/dL (ref 0.0–1.0)

## 2013-12-18 NOTE — Patient Instructions (Signed)
Offices that do circumcisions:  [facility] ([place]): [phone] $150 within 4 weeks of birth [facility] [phone] ([place]) $244 within 4 weeks of birth,  [facility] [phone] ([place]) $250 within 7 days of birth, [facility] [phone] ([place]) $175 within 2 weeks of birth  Second Trimester of Pregnancy The second trimester is from week 13 through week 28, months 4 through 6. The second trimester is often a time when you feel your best. Your body has also adjusted to being pregnant, and you begin to feel better physically. Usually, morning sickness has lessened or quit completely, you may have more energy, and you may have an increase in appetite. The second trimester is also a time when the fetus is growing rapidly. At the end of the sixth month, the fetus is about 9 inches long and weighs about 1 pounds. You will likely begin to feel the baby move (quickening) between 18 and 20 weeks of the pregnancy. BODY CHANGES Your body goes through many changes during pregnancy. The changes vary from woman to woman.   Your weight will continue to increase. You will notice your lower abdomen bulging out.  You may begin to get stretch marks on your hips, abdomen, and breasts.  You may develop headaches that can be relieved by medicines approved by your caregiver.  You may urinate more often because the fetus is pressing on your bladder.  You may develop or continue to have heartburn as a result of your pregnancy.  You may develop constipation because certain hormones are causing the muscles that push waste through your intestines to slow down.  You may develop hemorrhoids or swollen, bulging veins (varicose veins).  You may have back pain because of the weight gain and pregnancy hormones relaxing your joints between the bones in your pelvis and as a result of a shift in weight and the muscles that support your balance.  Your breasts will continue to grow and  be tender.  Your gums may bleed and may be sensitive to brushing and flossing.  Dark spots or blotches (chloasma, mask of pregnancy) may develop on your face. This will likely fade after the baby is born.  A dark line from your belly button to the pubic area (linea nigra) may appear. This will likely fade after the baby is born. WHAT TO EXPECT AT YOUR PRENATAL VISITS During a routine prenatal visit:  You will be weighed to make sure you and the fetus are growing normally.  Your blood pressure will be taken.  Your abdomen will be measured to track your baby's growth.  The fetal heartbeat will be listened to.  Any test results from the previous visit will be discussed. Your caregiver may ask you:  How you are feeling.  If you are feeling the baby move.  If you have had any abnormal symptoms, such as leaking fluid, bleeding, severe headaches, or abdominal cramping.  If you have any questions. Other tests that may be performed during your second trimester include:  Blood tests that check for:  Low iron levels (anemia).  Gestational diabetes (between 24 and 28 weeks).  Rh antibodies.  Urine tests to check for infections, diabetes, or protein in the urine.  An ultrasound to confirm the proper growth and development of the baby.  An amniocentesis to check for possible genetic problems.  Fetal screens for spina bifida and Down syndrome. HOME CARE INSTRUCTIONS   Avoid all smoking, herbs, alcohol, and unprescribed drugs. These chemicals affect the formation   and growth of the baby.  Follow your caregiver's instructions regarding medicine use. There are medicines that are either safe or unsafe to take during pregnancy.  Exercise only as directed by your caregiver. Experiencing uterine cramps is a good sign to stop exercising.  Continue to eat regular, healthy meals.  Wear a good support bra for breast tenderness.  Do not use hot tubs, steam rooms, or saunas.  Wear your  seat belt at all times when driving.  Avoid raw meat, uncooked cheese, cat litter boxes, and soil used by cats. These carry germs that can cause birth defects in the baby.  Take your prenatal vitamins.  Try taking a stool softener (if your caregiver approves) if you develop constipation. Eat more high-fiber foods, such as fresh vegetables or fruit and whole grains. Drink plenty of fluids to keep your urine clear or pale yellow.  Take warm sitz baths to soothe any pain or discomfort caused by hemorrhoids. Use hemorrhoid cream if your caregiver approves.  If you develop varicose veins, wear support hose. Elevate your feet for 15 minutes, 3 4 times a day. Limit salt in your diet.  Avoid heavy lifting, wear low heel shoes, and practice good posture.  Rest with your legs elevated if you have leg cramps or low back pain.  Visit your dentist if you have not gone yet during your pregnancy. Use a soft toothbrush to brush your teeth and be gentle when you floss.  A sexual relationship may be continued unless your caregiver directs you otherwise.  Continue to go to all your prenatal visits as directed by your caregiver. SEEK MEDICAL CARE IF:   You have dizziness.  You have mild pelvic cramps, pelvic pressure, or nagging pain in the abdominal area.  You have persistent nausea, vomiting, or diarrhea.  You have a bad smelling vaginal discharge.  You have pain with urination. SEEK IMMEDIATE MEDICAL CARE IF:   You have a fever.  You are leaking fluid from your vagina.  You have spotting or bleeding from your vagina.  You have severe abdominal cramping or pain.  You have rapid weight gain or loss.  You have shortness of breath with chest pain.  You notice sudden or extreme swelling of your face, hands, ankles, feet, or legs.  You have not felt your baby move in over an hour.  You have severe headaches that do not go away with medicine.  You have vision changes. Document Released:  09/07/2001 Document Revised: 05/16/2013 Document Reviewed: 11/14/2012 ExitCare Patient Information 2014 [employer].  

## 2013-12-18 NOTE — Progress Notes (Signed)
+  FM, no lof, no vb, no ctx Feeling sleepy Reports occassional sharp pain lower abodminal - resolved after used restroom. High specific gravity - encouraged increase H20.  Nira RetortShanikwa Wong is a 28 y.o. G2P1001 at 7833w3d by L=6 here for ROB visit.  Discussed with Patient:  -Plans to breast feed.  All questions answered. -Continue prenatal vitamins. - Reviewed NIPS -Reviewed fetal kick counts (Pt to perform daily at a time when the baby is active, lie laterally with both hands on belly in quiet room and count all movements (hiccups, shoulder rolls, obvious kicks, etc); pt is to report to clinic or MAU for less than 10 movements felt in a one hour time period-pt told as soon as she counts 10 movements the count is complete.)  - Routine precautions discussed (depression, infection s/s).   Patient provided with all pertinent phone numbers for emergencies. - RTC for any VB, regular, painful cramps/ctxs occurring at a rate of >2/10 min, fever (100.5 or higher), n/v/d, any pain that is unresolving or worsening, LOF, decreased fetal movement, CP, SOB, edema - f/u 2 weeks for glucola  Problems: Patient Active Problem List   Diagnosis Date Noted  . Echogenic bowel of fetus on prenatal ultrasound 11/07/2013  . Paresthesia of bilateral legs 10/10/2013  . Obesity complicating pregnancy, childbirth, or the puerperium, antepartum condition or complication(649.13) 09/14/2013  . Supervision of normal pregnancy in first trimester 08/22/2013  . Contraception management 06/18/2013  . Obesity 06/18/2013  . Migraine without aura 06/18/2013    To Do: 1.

## 2013-12-18 NOTE — Progress Notes (Signed)
P= 87 C/o of intermittent side pain, sharp in nature but believes sometimes it has to do with her position.

## 2013-12-28 ENCOUNTER — Inpatient Hospital Stay (HOSPITAL_COMMUNITY)
Admission: AD | Admit: 2013-12-28 | Discharge: 2013-12-29 | Disposition: A | Discharge status: Home / Self Care | Payer: Medicaid Other | Source: Ambulatory Visit | Attending: Obstetrics and Gynecology | Admitting: Obstetrics and Gynecology

## 2013-12-28 ENCOUNTER — Encounter (HOSPITAL_COMMUNITY): Payer: Self-pay

## 2013-12-28 DIAGNOSIS — O4700 False labor before 37 completed weeks of gestation, unspecified trimester: Secondary | ICD-10-CM | POA: Insufficient documentation

## 2013-12-28 DIAGNOSIS — R109 Unspecified abdominal pain: Secondary | ICD-10-CM | POA: Insufficient documentation

## 2013-12-28 DIAGNOSIS — M545 Low back pain, unspecified: Secondary | ICD-10-CM | POA: Insufficient documentation

## 2013-12-28 DIAGNOSIS — O368190 Decreased fetal movements, unspecified trimester, not applicable or unspecified: Secondary | ICD-10-CM | POA: Insufficient documentation

## 2013-12-28 DIAGNOSIS — R1011 Right upper quadrant pain: Secondary | ICD-10-CM | POA: Insufficient documentation

## 2013-12-28 DIAGNOSIS — O212 Late vomiting of pregnancy: Secondary | ICD-10-CM | POA: Insufficient documentation

## 2013-12-28 DIAGNOSIS — Z87891 Personal history of nicotine dependence: Secondary | ICD-10-CM | POA: Insufficient documentation

## 2013-12-28 LAB — URINALYSIS, ROUTINE W REFLEX MICROSCOPIC
BILIRUBIN URINE: NEGATIVE
GLUCOSE, UA: NEGATIVE mg/dL
KETONES UR: 15 mg/dL — AB
Nitrite: NEGATIVE
PROTEIN: 30 mg/dL — AB
Specific Gravity, Urine: >1.03 — ABNORMAL HIGH (ref 1.005–1.030)
UROBILINOGEN UA: 0.2 mg/dL (ref 0.0–1.0)
pH: 6 (ref 5.0–8.0)

## 2013-12-28 LAB — URINE MICROSCOPIC-ADD ON

## 2013-12-28 LAB — WET PREP, GENITAL
CLUE CELLS WET PREP: NONE SEEN
Trich, Wet Prep: NONE SEEN
YEAST WET PREP: NONE SEEN

## 2013-12-28 LAB — FETAL FIBRONECTIN: Fetal Fibronectin: POSITIVE — AB

## 2013-12-28 MED ORDER — BETAMETHASONE SOD PHOS & ACET 6 (3-3) MG/ML IJ SUSP
12.0000 mg | Freq: Once | INTRAMUSCULAR | Status: AC
  Administered 2013-12-29: 12 mg via INTRAMUSCULAR
  Filled 2013-12-28: qty 2

## 2013-12-28 NOTE — MAU Note (Signed)
Decreased FM today. Vaginal pain and increased watery vag d/c. Abdominal cramping for 4 days.

## 2013-12-28 NOTE — MAU Provider Note (Signed)
History     CSN: [id number]  Arrival date and time: 12/28/13 2209   First Provider Initiated Contact with Patient 12/28/13 2247      Chief Complaint  Patient presents with  . Decreased Fetal Movement  . Vaginal Pain  . Abdominal Cramping   HPI 28 year old female G2P1001 at [redacted]w[redacted]d presents with complaints of abdominal cramping, low back pain x 4 days.  She also reports decreased fetal movement today.  Patient reports 4 day history of lower abdominal cramping and low back/buttock pain.  It occurs several times daily and lasts ~ 1 min.  Pain is sharp and moderate to severe.  No exacerbating or relieving factors.  She reports associated nausea and vomiting x 1.  Patient also reports some epigastric/RUQ pain.  Patient also reports thin vaginal discharge and concern for LOF.  She has noticed fluid on her panty liner.  She reports good fetal movement prior but notes decreased fetal movement today.  No vaginal bleeding.  No fever, chills or other associated symptoms.   PNC: [facility]. Of note, patient has had similar complaints at prior office visit and MAU visit.   Past Medical History  Diagnosis Date  . Headache(784.0)   . Anxiety   . Anemia 2009    s/p delivery    Past Surgical History  Procedure Laterality Date  . No past surgeries      Family History  Problem Relation Age of Onset  . Hypertension Mother   . Hypertension Paternal Aunt   . Cancer Maternal Grandfather     History  Substance Use Topics  . Smoking status: Former Smoker -- 0.50 packs/day    Quit date: 06/08/2013  . Smokeless tobacco: Never Used  . Alcohol Use: No     Comment: occasional    Allergies:  Allergies  Allergen Reactions  . Other     Has an allergy to some type of eye drop but does not remember the name (in childhood)    Prescriptions prior to admission  Medication Sig Dispense Refill  . acetaminophen (TYLENOL) 500 MG tablet Take 1,000 mg by mouth every 6 (six) hours as needed for headache.       . clotrimazole (LOTRIMIN) 1 % cream Apply 1 application topically 2 (two) times daily.  30 g  0  . cyclobenzaprine (FLEXERIL) 10 MG tablet Take 1 tablet (10 mg total) by mouth 3 (three) times daily as needed for muscle spasms.  30 tablet  0  . Prenatal Vit-Fe Fumarate-FA (PRENATAL MULTIVITAMIN) TABS tablet Take 1 tablet by mouth daily at 12 noon.        ROS Per HPI Physical Exam   Pulse 97, temperature 97.8 F (36.6 C), resp. rate 20, height 5\' 9"  (1.753 m), weight 122.199 kg (269 lb 6.4 oz), last menstrual period 06/16/2013.  Physical Exam Gen: well appearing female in NAD.  Heart: RRR. No m/r/g. Lungs: CTAB. Normal WOB.  Abd: gravid, tender to palpation left periumbilical region, epigastic region and RUQ; negative murphy's sign.  No guarding or rebound. Ext: no appreciable lower extremity edema bilaterally Neuro: No focal deficits.  Pelvic Exam:        External: normal female genitalia without lesions or masses        Vagina: minimal white discharge noted. No pooling noted.         Cervix: normal without lesions or masses. Closed.         Samples for Wet prep, Fern & FFN   FHR: baseline  140, mod variability, 15x15 accels Toco: None   MAU Course  Procedures  MDM  Assessment and Plan  28 year old female G2P1001 at 5346w6d presents with complaints of abdominal cramping, low back pain x 4 days.  She also reports decreased fetal movement today.  # Abdominal pain, low back pain - Negative murphy's sign; Abdominal exam reassuring - Pain similar to prior pains - No indication for imaging at this time. - Patient reassured;  Advised symptomatic care.  # Decreased fetal movement - Category 1 tracing - Baby reactive  # Concern for LOF, contractions - Fern negative. - No contractions on toco - FFN collected and was positive.  Will treat with Betamethasone x 1.  Patient to return in ~ 24 hours for second dose.  Everlene OtherJayce Niclas Markell DO Family Medicine PGY-2     Everlene Otherook,  Bambi Fehnel 12/28/2013, 10:49 PM

## 2013-12-29 ENCOUNTER — Other Ambulatory Visit: Payer: Self-pay | Admitting: Obstetrics and Gynecology

## 2013-12-29 DIAGNOSIS — R300 Dysuria: Principal | ICD-10-CM

## 2013-12-29 DIAGNOSIS — O26893 Other specified pregnancy related conditions, third trimester: Secondary | ICD-10-CM

## 2013-12-29 NOTE — Discharge Instructions (Signed)
Your work up was negative (other than the fibronectin).   Please use tylenol as needed for your pain. Please return Sunday morning for your second dose of Betamethasone.  Fetal Fibronectin This is a test done to help evaluate a pregnant woman's risk of pre-term delivery. It is generally done when you are 26 to [redacted] weeks pregnant and are having symptoms of premature labor. A Dacron swab is used to take a sample of cervical or vaginal fluid from the back portion of the vagina or from the area just outside the opening of the cervix. Fetal fibronectin (fFN) is a glycoprotein that can be used to help predict the short term risk of premature delivery. fFN is produced at the boundary between the amniotic sac and the lining of the mother's uterus. This is called the unteroplacental junction. Fetal fibronectin is largely confined to this junction and thought to help maintain the integrity of the boundary. fFN is normally detectable in cervicovaginal fluid during the first 20 to 24 weeks of pregnancy, and then is detectable again after about 36 weeks.  Finding fFN in cervicovaginal fluids after 36 weeks is not unusual as it is often released by the body as it gets ready for childbirth. The elevated fFN found in vaginal fluids early in pregnancy may simply reflect the normal growth and establishment of tissues at the unteroplacental junction with levels falling when this phase is complete. What is known is that fFN that is detected between 24 and 36 weeks of pregnancy is not normal. Elevated levels reflect a disturbance at the uteroplacental junction and have been associated with an increased risk of pre-term labor and delivery. Knowing whether or not a woman is likely to deliver prematurely helps your caregiver plan a course of action. The fFN test is a relatively non-invasive tool to help the caregiver to distinguish between those who are likely to deliver shortly and those who are not.  PREPARATION FOR TEST    Inform the person conducting the test if you have a medical condition or are using any medications that cause excessive bleeding.  Do not have sexual intercourse for 24 hours before the procedure. NORMAL FINDINGS  Pregnancy = 50 nanograms/ml Ranges for normal findings may vary among different laboratories and hospitals. You should always check with your doctor after having lab work or other tests done to discuss the meaning of your test results and whether your values are considered within normal limits. MEANING OF TEST  Your caregiver will go over the test results with you and discuss the importance and meaning of your results, as well as treatment options and the need for additional tests if necessary. OBTAINING THE TEST RESULTS  It is your responsibility to obtain your test results. Ask the lab or department performing the test when and how you will get your results. Document Released: 07/15/2004 Document Revised: 12/06/2011 Document Reviewed: 08/23/2008 [facility] Patient Information 2014 [place], [place].

## 2013-12-30 ENCOUNTER — Inpatient Hospital Stay (HOSPITAL_COMMUNITY)
Admission: AD | Admit: 2013-12-30 | Discharge: 2013-12-30 | Disposition: A | Discharge status: Home / Self Care | Payer: Medicaid Other | Source: Ambulatory Visit | Attending: Obstetrics and Gynecology | Admitting: Obstetrics and Gynecology

## 2013-12-30 DIAGNOSIS — D649 Anemia, unspecified: Secondary | ICD-10-CM | POA: Insufficient documentation

## 2013-12-30 DIAGNOSIS — Z09 Encounter for follow-up examination after completed treatment for conditions other than malignant neoplasm: Secondary | ICD-10-CM | POA: Insufficient documentation

## 2013-12-30 DIAGNOSIS — O4700 False labor before 37 completed weeks of gestation, unspecified trimester: Secondary | ICD-10-CM | POA: Insufficient documentation

## 2013-12-30 DIAGNOSIS — F411 Generalized anxiety disorder: Secondary | ICD-10-CM | POA: Insufficient documentation

## 2013-12-30 DIAGNOSIS — Z8249 Family history of ischemic heart disease and other diseases of the circulatory system: Secondary | ICD-10-CM | POA: Insufficient documentation

## 2013-12-30 DIAGNOSIS — Z87891 Personal history of nicotine dependence: Secondary | ICD-10-CM | POA: Insufficient documentation

## 2013-12-30 LAB — CULTURE, OB URINE: SPECIAL REQUESTS: NORMAL

## 2013-12-30 MED ORDER — CEPHALEXIN 500 MG PO CAPS: 500.0000 mg | ORAL_CAPSULE | Freq: Four times a day (QID) | ORAL | Status: DC

## 2013-12-30 MED ORDER — BETAMETHASONE SOD PHOS & ACET 6 (3-3) MG/ML IJ SUSP
12.0000 mg | Freq: Once | INTRAMUSCULAR | Status: AC
Start: 2013-12-30 — End: 2013-12-30
  Administered 2013-12-30: 12 mg via INTRAMUSCULAR
  Filled 2013-12-30: qty 2

## 2013-12-30 NOTE — MAU Provider Note (Signed)
History     CSN: [id number]  Arrival date and time: 12/30/13 1002   None     No chief complaint on file.  HPI Sandra Wong is a 28 y.o. G2P1001 at 2448w1d who is here today for MZ #2 due to +FFN. She denies any contractios or LOF at this time. She states the baby is moving normally. She states that the pain that was initially in the RUQ when she was here the previously has moved, and it is mostly around the umbilicus now.   UA from prior visit, with urine culture pending. D/W Dr. Emelda FearFerguson, and will treat empirically with keflex at this time.  Results for Sandra RetortHARRINGTON, Sandra (MRN [id number]) as of 12/30/2013 10:46  Ref. Range 12/28/2013 22:25  Color, Urine Latest Range: YELLOW  YELLOW  APPearance Latest Range: CLEAR  CLEAR  Specific Gravity, Urine Latest Range: 1.005-1.030  >1.030 (H)  pH Latest Range: 5.0-8.0  6.0  Glucose Latest Range: NEGATIVE mg/dL NEGATIVE  Bilirubin Urine Latest Range: NEGATIVE  NEGATIVE  Ketones, ur Latest Range: NEGATIVE mg/dL 15 (A)  Protein Latest Range: NEGATIVE mg/dL 30 (A)  Urobilinogen, UA Latest Range: 0.0-1.0 mg/dL 0.2  Nitrite Latest Range: NEGATIVE  NEGATIVE  Leukocytes, UA Latest Range: NEGATIVE  TRACE (A)  Hgb urine dipstick Latest Range: NEGATIVE  TRACE (A)  Urine-Other No range found MUCOUS PRESENT  WBC, UA Latest Range: <3 WBC/hpf 11-20  RBC / HPF Latest Range: <3 RBC/hpf 11-20  Squamous Epithelial / LPF Latest Range: RARE  FEW (A)  Bacteria, UA Latest Range: RARE  MANY (A)    Past Medical History  Diagnosis Date  . Headache(784.0)   . Anxiety   . Anemia 2009    s/p delivery    Past Surgical History  Procedure Laterality Date  . No past surgeries      Family History  Problem Relation Age of Onset  . Hypertension Mother   . Hypertension Paternal Aunt   . Cancer Maternal Grandfather     History  Substance Use Topics  . Smoking status: Former Smoker -- 0.50 packs/day    Quit date: 06/08/2013  . Smokeless tobacco: Never  Used  . Alcohol Use: No     Comment: occasional    Allergies:  Allergies  Allergen Reactions  . Other     Has an allergy to some type of eye drop but does not remember the name (in childhood)    Prescriptions prior to admission  Medication Sig Dispense Refill  . acetaminophen (TYLENOL) 500 MG tablet Take 1,000 mg by mouth every 6 (six) hours as needed for headache.      . clotrimazole (LOTRIMIN) 1 % cream Apply 1 application topically 2 (two) times daily.  30 g  0  . cyclobenzaprine (FLEXERIL) 10 MG tablet Take 1 tablet (10 mg total) by mouth 3 (three) times daily as needed for muscle spasms.  30 tablet  0  . Prenatal Vit-Fe Fumarate-FA (PRENATAL MULTIVITAMIN) TABS tablet Take 1 tablet by mouth daily at 12 noon.        ROS Physical Exam   Last menstrual period 06/16/2013.  Physical Exam  Nursing note and vitals reviewed. Constitutional: She is oriented to person, place, and time. She appears well-developed and well-nourished. No distress.  Cardiovascular: Normal rate.   Respiratory: Effort normal.  GI: Soft. There is no tenderness. There is no rebound.  Neurological: She is alert and oriented to person, place, and time.  Skin: Skin is warm and dry.  Psychiatric:  She has a normal mood and affect.    MAU Course  Procedures    Assessment and Plan  Threatened preterm labor BMZ #2 today 2/2 +FFN RX: keflex 500mg  QID X 7 days Next appointment in clinic on 01/09/14 Return to MAU sooner with any contractions or LOF or decreased fetal movement  Tawnya Crook 12/30/2013, 10:44 AM

## 2013-12-30 NOTE — MAU Note (Signed)
Pt states here for injection only. Denies bleeding or gush of fluid.

## 2014-01-01 ENCOUNTER — Encounter: Payer: Medicaid Other | Admitting: Obstetrics and Gynecology

## 2014-01-01 NOTE — MAU Provider Note (Signed)
Attestation of Attending Supervision of Advanced Practitioner: Evaluation and management procedures were performed by the PA/NP/CNM/OB Fellow under my supervision/collaboration. Chart reviewed and agree with management and plan.  Jamael Hoffmann V 01/01/2014 11:05 PM   

## 2014-01-02 ENCOUNTER — Encounter (HOSPITAL_COMMUNITY): Payer: Self-pay

## 2014-01-02 ENCOUNTER — Inpatient Hospital Stay (HOSPITAL_COMMUNITY)
Admission: AD | Admit: 2014-01-02 | Discharge: 2014-01-02 | Disposition: A | Discharge status: Home / Self Care | Payer: Medicaid Other | Source: Ambulatory Visit | Attending: Obstetrics & Gynecology | Admitting: Obstetrics & Gynecology

## 2014-01-02 ENCOUNTER — Inpatient Hospital Stay (HOSPITAL_COMMUNITY)
Admission: AD | Admit: 2014-01-02 | Discharge: 2014-01-05 | DRG: 775 | Disposition: A | Discharge status: Home / Self Care | Payer: Medicaid Other | Source: Ambulatory Visit | Attending: Obstetrics & Gynecology | Admitting: Obstetrics & Gynecology

## 2014-01-02 DIAGNOSIS — M545 Low back pain, unspecified: Secondary | ICD-10-CM | POA: Insufficient documentation

## 2014-01-02 DIAGNOSIS — Z87891 Personal history of nicotine dependence: Secondary | ICD-10-CM | POA: Insufficient documentation

## 2014-01-02 DIAGNOSIS — O9989 Other specified diseases and conditions complicating pregnancy, childbirth and the puerperium: Principal | ICD-10-CM

## 2014-01-02 DIAGNOSIS — O99210 Obesity complicating pregnancy, unspecified trimester: Secondary | ICD-10-CM

## 2014-01-02 DIAGNOSIS — R109 Unspecified abdominal pain: Secondary | ICD-10-CM | POA: Insufficient documentation

## 2014-01-02 DIAGNOSIS — F411 Generalized anxiety disorder: Secondary | ICD-10-CM | POA: Insufficient documentation

## 2014-01-02 DIAGNOSIS — O42919 Preterm premature rupture of membranes, unspecified as to length of time between rupture and onset of labor, unspecified trimester: Secondary | ICD-10-CM

## 2014-01-02 DIAGNOSIS — O99891 Other specified diseases and conditions complicating pregnancy: Secondary | ICD-10-CM | POA: Insufficient documentation

## 2014-01-02 DIAGNOSIS — O99344 Other mental disorders complicating childbirth: Secondary | ICD-10-CM | POA: Diagnosis present

## 2014-01-02 DIAGNOSIS — Z3491 Encounter for supervision of normal pregnancy, unspecified, first trimester: Secondary | ICD-10-CM

## 2014-01-02 DIAGNOSIS — E669 Obesity, unspecified: Secondary | ICD-10-CM

## 2014-01-02 DIAGNOSIS — O4290 Premature rupture of membranes, unspecified as to length of time between rupture and onset of labor, unspecified weeks of gestation: Principal | ICD-10-CM | POA: Diagnosis present

## 2014-01-02 DIAGNOSIS — O411090 Infection of amniotic sac and membranes, unspecified, unspecified trimester, not applicable or unspecified: Secondary | ICD-10-CM | POA: Diagnosis present

## 2014-01-02 MED ORDER — SODIUM CHLORIDE 0.9 % IV SOLN
250.0000 mg | Freq: Four times a day (QID) | INTRAVENOUS | Status: DC
  Administered 2014-01-03: 250 mg via INTRAVENOUS
  Filled 2014-01-02 (×3): qty 5

## 2014-01-02 MED ORDER — SODIUM CHLORIDE 0.9 % IV SOLN: 2.0000 g | Freq: Four times a day (QID) | INTRAVENOUS | Status: DC

## 2014-01-02 MED ORDER — SODIUM CHLORIDE 0.9 % IV SOLN: 250.0000 mg | Freq: Four times a day (QID) | INTRAVENOUS | Status: DC

## 2014-01-02 MED ORDER — LACTATED RINGERS IV SOLN
INTRAVENOUS | Status: DC
  Administered 2014-01-03: via INTRAVENOUS

## 2014-01-02 NOTE — MAU Note (Signed)
Patient water broke when sitting in wheelchair in waiting room

## 2014-01-02 NOTE — Discharge Instructions (Signed)
Abdominal Pain During Pregnancy °Abdominal pain is common in pregnancy. Most of the time, it does not cause harm. There are many causes of abdominal pain. Some causes are more serious than others. Some of the causes of abdominal pain in pregnancy are easily diagnosed. Occasionally, the diagnosis takes time to understand. Other times, the cause is not determined. Abdominal pain can be a sign that something is very wrong with the pregnancy, or the pain may have nothing to do with the pregnancy at all. For this reason, always tell your health care provider if you have any abdominal discomfort. °HOME CARE INSTRUCTIONS  °Monitor your abdominal pain for any changes. The following actions may help to alleviate any discomfort you are experiencing: °· Do not have sexual intercourse or put anything in your vagina until your symptoms go away completely. °· Get plenty of rest until your pain improves. °· Drink clear fluids if you feel nauseous. Avoid solid food as long as you are uncomfortable or nauseous. °· Only take over-the-counter or prescription medicine as directed by your health care provider. °· Keep all follow-up appointments with your health care provider. °SEEK IMMEDIATE MEDICAL CARE IF: °· You are bleeding, leaking fluid, or passing tissue from the vagina. °· You have increasing pain or cramping. °· You have persistent vomiting. °· You have painful or bloody urination. °· You have a fever. °· You notice a decrease in your baby's movements. °· You have extreme weakness or feel faint. °· You have shortness of breath, with or without abdominal pain. °· You develop a severe headache with abdominal pain. °· You have abnormal vaginal discharge with abdominal pain. °· You have persistent diarrhea. °· You have abdominal pain that continues even after rest, or gets worse. °MAKE SURE YOU:  °· Understand these instructions. °· Will watch your condition. °· Will get help right away if you are not doing well or get  worse. °Document Released: 09/13/2005 Document Revised: 07/04/2013 Document Reviewed: 04/12/2013 °ExitCare® Patient Information ©2014 [employer]. ° °

## 2014-01-02 NOTE — MAU Provider Note (Signed)
History     CSN: [id number]  Arrival date and time: 01/02/14 0524   None     Chief Complaint  Patient presents with  . Labor Eval   HPI  28 year old female G2P1001 at [redacted]w[redacted]d presents with complaints of abdominal cramping, low back pain.  Patient was recently seen in the MAU on 4/3 with similar complaints.  FFN was obtained and was positive and patient was given BMZ.  At follow up visit on 4/5, she was given a second dose of BMZ and started on Keflex for ? UTI.  Today, patient reports lower abdominal cramping, low back pain and pelvic pressure.  Symptoms began last night at 8 pm.  She states that it feels like she needs to have a BM.  Symptoms are moderate to severe and are intermittent.  She is concerned that this may be preterm labor.  She denies vaginal bleeding and LOF.  She reports good fetal movement.  No complaints of nausea/vomiting, fever, chills, dysuria.   Past Medical History  Diagnosis Date  . Headache(784.0)   . Anxiety   . Anemia 2009    s/p delivery    Past Surgical History  Procedure Laterality Date  . No past surgeries      Family History  Problem Relation Age of Onset  . Hypertension Mother   . Hypertension Paternal Aunt   . Cancer Maternal Grandfather     History  Substance Use Topics  . Smoking status: Former Smoker -- 0.50 packs/day    Quit date: 06/08/2013  . Smokeless tobacco: Never Used  . Alcohol Use: No     Comment: occasional    Allergies:  Allergies  Allergen Reactions  . Other     Has an allergy to some type of eye drop but does not remember the name (in childhood)    Prescriptions prior to admission  Medication Sig Dispense Refill  . acetaminophen (TYLENOL) 500 MG tablet Take 1,000 mg by mouth every 6 (six) hours as needed for headache.      . cephALEXin (KEFLEX) 500 MG capsule Take 1 capsule (500 mg total) by mouth 4 (four) times daily.  28 capsule  0  . clotrimazole (LOTRIMIN) 1 % cream Apply 1 application topically 2 (two)  times daily.  30 g  0  . cyclobenzaprine (FLEXERIL) 10 MG tablet Take 1 tablet (10 mg total) by mouth 3 (three) times daily as needed for muscle spasms.  30 tablet  0  . Prenatal Vit-Fe Fumarate-FA (PRENATAL MULTIVITAMIN) TABS tablet Take 1 tablet by mouth daily at 12 noon.       ROS Per HPI Physical Exam   Blood pressure 123/68, pulse 99, temperature 98.2 F (36.8 C), temperature source Oral, resp. rate 18, last menstrual period 06/16/2013, SpO2 100.00%.  Physical Exam Gen: appears anxious but in NAD. Heart: RRR. No m/r/g. Lungs: CTAB.  Abd: gravid but otherwise soft, nontender to palpation Ext: no appreciable lower extremity edema bilaterally Neuro: No focal deficits. GU: normal appearing external genitalia Cervical Exam: Dilation: Closed Effacement (%): Thick Cervical Position: Posterior Station: Ballotable Exam by:: Dr. Adriana Simas  FHR: baseline 150, mod variability, 10x10 accels, 2 decels noted Toco: No uterine activity noted   MAU Course  Procedures  Assessment and Plan  28 year old female G2P1001 at [redacted]w[redacted]d presents with complaints of abdominal cramping, low back pain, and pelvic pressure. - Patient not contracting with reactive tracing and closed cervix - Unclear etiology of symptoms, but patient is not in  pre-term labor - Case discussed with CNMW Thressa ShellerHeather Hogan.  Patient stable for discharge home.   Tommie SamsJayce G Cook 01/02/2014, 6:01 AM   I have seen the patient with the resident/student and agree with the above.  Tawnya CrookHeather Donovan Hogan

## 2014-01-02 NOTE — MAU Note (Addendum)
Cramps since 8 pm.  Denies bleeding.  Having pressure at bladder and bottom. Voiding often but color seems real clear to patient.  Hasn't felt baby move since cramps started.  Reports Friday, she tested positive for preterm labor and has received 2 shots of betamethasone.

## 2014-01-03 ENCOUNTER — Encounter (HOSPITAL_COMMUNITY): Payer: Self-pay | Admitting: *Deleted

## 2014-01-03 DIAGNOSIS — O4290 Premature rupture of membranes, unspecified as to length of time between rupture and onset of labor, unspecified weeks of gestation: Secondary | ICD-10-CM

## 2014-01-03 LAB — TYPE AND SCREEN
ABO/RH(D): O POS
Antibody Screen: POSITIVE
DAT, IgG: NEGATIVE

## 2014-01-03 LAB — CBC
HCT: 32.5 % — ABNORMAL LOW (ref 36.0–46.0)
Hemoglobin: 10.5 g/dL — ABNORMAL LOW (ref 12.0–15.0)
MCH: 26.5 pg (ref 26.0–34.0)
MCHC: 32.3 g/dL (ref 30.0–36.0)
MCV: 82.1 fL (ref 78.0–100.0)
PLATELETS: 168 10*3/uL (ref 150–400)
RBC: 3.96 MIL/uL (ref 3.87–5.11)
RDW: 15.1 % (ref 11.5–15.5)
WBC: 10.5 10*3/uL (ref 4.0–10.5)

## 2014-01-03 LAB — GROUP B STREP BY PCR: Group B strep by PCR: NEGATIVE

## 2014-01-03 MED ORDER — NIFEDIPINE 10 MG PO CAPS: 10.0000 mg | ORAL_CAPSULE | Freq: Four times a day (QID) | ORAL | Status: DC

## 2014-01-03 MED ORDER — OXYTOCIN 40 UNITS IN LACTATED RINGERS INFUSION - SIMPLE MED: 62.5000 mL/h | Freq: Once | INTRAVENOUS | Status: DC

## 2014-01-03 MED ORDER — PRENATAL MULTIVITAMIN CH
1.0000 | ORAL_TABLET | Freq: Every day | ORAL | Status: DC
  Administered 2014-01-03 – 2014-01-04 (×2): 1 via ORAL
  Filled 2014-01-03 (×2): qty 1

## 2014-01-03 MED ORDER — SODIUM CHLORIDE 0.9 % IV SOLN
250.0000 mg | Freq: Four times a day (QID) | INTRAVENOUS | Status: DC
  Filled 2014-01-03 (×2): qty 5

## 2014-01-03 MED ORDER — MAGNESIUM SULFATE BOLUS VIA INFUSION
4.0000 g | Freq: Once | INTRAVENOUS | Status: AC
  Administered 2014-01-03: 4 g via INTRAVENOUS
  Filled 2014-01-03: qty 500

## 2014-01-03 MED ORDER — DOCUSATE SODIUM 100 MG PO CAPS
100.0000 mg | ORAL_CAPSULE | Freq: Every day | ORAL | Status: DC
  Filled 2014-01-03: qty 1

## 2014-01-03 MED ORDER — MAGNESIUM SULFATE 40 G IN LACTATED RINGERS - SIMPLE
2.0000 g/h | INTRAVENOUS | Status: AC
  Filled 2014-01-03: qty 500

## 2014-01-03 MED ORDER — ZOLPIDEM TARTRATE 5 MG PO TABS: 5.0000 mg | ORAL_TABLET | Freq: Every evening | ORAL | Status: DC | PRN

## 2014-01-03 MED ORDER — MISOPROSTOL 200 MCG PO TABS
1000.0000 ug | ORAL_TABLET | Freq: Once | ORAL | Status: AC
  Administered 2014-01-03: 1000 ug via RECTAL

## 2014-01-03 MED ORDER — NIFEDIPINE 10 MG PO CAPS: 30.0000 mg | ORAL_CAPSULE | Freq: Once | ORAL | Status: DC

## 2014-01-03 MED ORDER — IBUPROFEN 600 MG PO TABS
600.0000 mg | ORAL_TABLET | Freq: Four times a day (QID) | ORAL | Status: DC
  Administered 2014-01-03 – 2014-01-05 (×9): 600 mg via ORAL
  Filled 2014-01-03 (×9): qty 1

## 2014-01-03 MED ORDER — DIPHENHYDRAMINE HCL 25 MG PO CAPS: 25.0000 mg | ORAL_CAPSULE | Freq: Four times a day (QID) | ORAL | Status: DC | PRN

## 2014-01-03 MED ORDER — OXYCODONE-ACETAMINOPHEN 5-325 MG PO TABS
1.0000 | ORAL_TABLET | ORAL | Status: DC | PRN
  Administered 2014-01-04: 1 via ORAL
  Filled 2014-01-03: qty 1

## 2014-01-03 MED ORDER — FENTANYL CITRATE 0.05 MG/ML IJ SOLN
50.0000 ug | INTRAMUSCULAR | Status: DC | PRN
  Administered 2014-01-03: 50 ug via INTRAVENOUS
  Filled 2014-01-03: qty 2

## 2014-01-03 MED ORDER — LANOLIN HYDROUS EX OINT: TOPICAL_OINTMENT | CUTANEOUS | Status: DC | PRN

## 2014-01-03 MED ORDER — OXYTOCIN 40 UNITS IN LACTATED RINGERS INFUSION - SIMPLE MED
INTRAVENOUS | Status: AC
  Administered 2014-01-03: 40 [IU]
  Filled 2014-01-03: qty 1000

## 2014-01-03 MED ORDER — ERYTHROMYCIN BASE 250 MG PO TABS: 250.0000 mg | ORAL_TABLET | Freq: Four times a day (QID) | ORAL | Status: DC

## 2014-01-03 MED ORDER — ONDANSETRON HCL 4 MG PO TABS: 4.0000 mg | ORAL_TABLET | ORAL | Status: DC | PRN

## 2014-01-03 MED ORDER — TETANUS-DIPHTH-ACELL PERTUSSIS 5-2.5-18.5 LF-MCG/0.5 IM SUSP: 0.5000 mL | Freq: Once | INTRAMUSCULAR | Status: DC

## 2014-01-03 MED ORDER — FENTANYL CITRATE 0.05 MG/ML IJ SOLN
50.0000 ug | Freq: Once | INTRAMUSCULAR | Status: AC
Start: 2014-01-03 — End: 2014-01-03
  Administered 2014-01-03: 50 ug via INTRAVENOUS
  Filled 2014-01-03: qty 2

## 2014-01-03 MED ORDER — SODIUM CHLORIDE 0.9 % IV SOLN
2.0000 g | Freq: Once | INTRAVENOUS | Status: AC
  Administered 2014-01-03: 2 g via INTRAVENOUS
  Filled 2014-01-03: qty 2000

## 2014-01-03 MED ORDER — ONDANSETRON HCL 4 MG/2ML IJ SOLN: 4.0000 mg | INTRAMUSCULAR | Status: DC | PRN

## 2014-01-03 MED ORDER — SIMETHICONE 80 MG PO CHEW: 80.0000 mg | CHEWABLE_TABLET | ORAL | Status: DC | PRN

## 2014-01-03 MED ORDER — AMPICILLIN SODIUM 2 G IJ SOLR
2.0000 g | Freq: Four times a day (QID) | INTRAMUSCULAR | Status: DC
  Filled 2014-01-03 (×2): qty 2000

## 2014-01-03 MED ORDER — ACETAMINOPHEN 325 MG PO TABS: 650.0000 mg | ORAL_TABLET | ORAL | Status: DC | PRN

## 2014-01-03 MED ORDER — DIBUCAINE 1 % RE OINT: 1.0000 "application " | TOPICAL_OINTMENT | RECTAL | Status: DC | PRN

## 2014-01-03 MED ORDER — AMOXICILLIN 500 MG PO CAPS: 500.0000 mg | ORAL_CAPSULE | Freq: Three times a day (TID) | ORAL | Status: DC

## 2014-01-03 MED ORDER — MISOPROSTOL 200 MCG PO TABS
ORAL_TABLET | ORAL | Status: AC
  Filled 2014-01-03: qty 5

## 2014-01-03 MED ORDER — WITCH HAZEL-GLYCERIN EX PADS: 1.0000 "application " | MEDICATED_PAD | CUTANEOUS | Status: DC | PRN

## 2014-01-03 MED ORDER — CALCIUM CARBONATE ANTACID 500 MG PO CHEW
2.0000 | CHEWABLE_TABLET | ORAL | Status: DC | PRN
  Filled 2014-01-03: qty 2

## 2014-01-03 MED ORDER — SENNOSIDES-DOCUSATE SODIUM 8.6-50 MG PO TABS
2.0000 | ORAL_TABLET | ORAL | Status: DC
  Administered 2014-01-03 – 2014-01-05 (×2): 2 via ORAL
  Filled 2014-01-03 (×2): qty 2

## 2014-01-03 MED ORDER — PRENATAL MULTIVITAMIN CH
1.0000 | ORAL_TABLET | Freq: Every day | ORAL | Status: DC
  Filled 2014-01-03: qty 1

## 2014-01-03 MED ORDER — BENZOCAINE-MENTHOL 20-0.5 % EX AERO: 1.0000 "application " | INHALATION_SPRAY | CUTANEOUS | Status: DC | PRN

## 2014-01-03 MED ORDER — LACTATED RINGERS IV SOLN: INTRAVENOUS | Status: DC

## 2014-01-03 NOTE — Lactation Note (Signed)
This note was copied from the chart of Sandra Wong. Lactation Consultation Note Initial consultation, baby 5 hours old. Baby 28 weeks in NICU, mom wants to provide breast milk for baby.  Mom has a 707 year old son, she did not breast feed him. This is her first time expressing milk. Initiated DEP, inst mom in frequency of pumping, hand expression, cleaning, etc. Mom pumped for 15 min, no colostrum expressed. Then with hand expression, about 5 ml colostrum expressed, labeled and taken to NICU.  Written instructions for pumping provided for mom. Reviewed NICU Breastfeeding booklet, lactation brochure, community resources, BFSG. [facility] sent.  Mom did not seem to like using the pump, she preferred hand expression. Enc mom to pump and hand express 8 times a day, once at night, but if she is uncomfortable pumping, to at least hand express.  Enc mom to call if she has any concerns.   Patient Name: Sandra Wong FAOZH'YToday's Date: 01/03/2014 Reason for consult: Initial assessment;NICU baby   Maternal Data Formula Feeding for Exclusion: Yes Reason for exclusion: Admission to Intensive Care Unit (ICU) post-partum Has patient been taught Hand Expression?: Yes Does the patient have breastfeeding experience prior to this delivery?: No  Feeding    LATCH Score/Interventions                      Lactation Tools Discussed/Used WIC Program: Yes Pump Review: Setup, frequency, and cleaning;Milk Storage Initiated by:: BS Date initiated:: 01/03/14   Consult Status Consult Status: Follow-up Follow-up type: In-patient    [name] 01/03/2014, 12:18 PM

## 2014-01-03 NOTE — Progress Notes (Signed)
Sandra RetortShanikwa Wong is a 28 y.o. G2P1001 at 5850w5d by 6wk US admitted for PPROM and PTL  Subjective: Called to evaluate for painful ctx and feeling need to push.   Objective: BP 137/69  Pulse 107  Temp(Src) 98.8 F (37.1 C) (Oral)  Resp 14  Ht 5\' 9"  (1.753 m)  Wt 120.203 kg (265 lb)  BMI 39.12 kg/m2  LMP 06/16/2013   Total I/O In: 343.8 [I.V.:243.8; IV Piggyback:100] Out: 0   FHT:  FHR: 150s-160s bpm, variability: minimal ,  accelerations:  Abscent,  decelerations:  Absent UC:   Not tracing well SVE:   Dilation: 4.5 (by speculum) Exam by:: Dr. Ike Benedom  Labs: Lab Results  Component Value Date   WBC 10.5 01/03/2014   HGB 10.5* 01/03/2014   HCT 32.5* 01/03/2014   MCV 82.1 01/03/2014   PLT 168 01/03/2014    Assessment / Plan: PPROM - meconium stained  Labor: attempting tocolysis with mag inc to 3g/hr Preeclampsia:  no signs or symptoms of toxicity Fetal Wellbeing:  Category II Pain Control:  Labor support without medications I/D:  Cont IV abx amp/erythrom  Minta BalsamMichael R Juergen Hardenbrook 01/03/2014, 3:39 AM

## 2014-01-03 NOTE — H&P (Signed)
Sandra Wong is a 28 y.o. female G2P1001 with IUP at 5334w5d by L=6wk US. Pt was seen on 4/3 with vague complaints of pelvic pain, cramping, decreased fetal movement, n/v. Pt had a +FFN at that time and rec'd BMZ x2 on 4/4 and 4/5. Pt was also started presumptively on Keflex to tx ?UTI. Cx was contaminated.  Pt returned last night with complaints of contractions since 8pm and had a negative evaluation, and she was discharged with a closed cervix.  Pt returned today for reevaluation. As she was walking to her room there was a large gush of meconium stained fluid and has continued to leek. She reports fetal movement and frequent contractions.   Pt without other complaints at this time.  Prenatal History/Complications: Uncomplicated prenatal course.  Obese. Fetus with echogenic bowel  Past Medical History: Past Medical History  Diagnosis Date  . Headache(784.0)   . Anxiety   . Anemia 2009    s/p delivery    Past Surgical History: Past Surgical History  Procedure Laterality Date  . No past surgeries      Obstetrical History: OB History   Grav Para Term Preterm Abortions TAB SAB Ect Mult Living   2 1 1       1       Gynecological History: OB History   Grav Para Term Preterm Abortions TAB SAB Ect Mult Living   2 1 1       1       Social History: History   Social History  . Marital Status: Single    Spouse Name: N/A    Number of Children: N/A  . Years of Education: N/A   Social History Main Topics  . Smoking status: Former Smoker -- 0.50 packs/day    Quit date: 06/08/2013  . Smokeless tobacco: Never Used  . Alcohol Use: No     Comment: occasional  . Drug Use: No  . Sexual Activity: Yes    Birth Control/ Protection: None   Other Topics Concern  . Not on file   Social History Narrative  . No narrative on file    Family History: Family History  Problem Relation Age of Onset  . Hypertension Mother   . Hypertension Paternal Aunt   . Cancer Maternal  Grandfather     Allergies: Allergies  Allergen Reactions  . Other     Has an allergy to some type of eye drop but does not remember the name (in childhood)    Prescriptions prior to admission  Medication Sig Dispense Refill  . acetaminophen (TYLENOL) 500 MG tablet Take 1,000 mg by mouth every 6 (six) hours as needed for headache.      . cephALEXin (KEFLEX) 500 MG capsule Take 1 capsule (500 mg total) by mouth 4 (four) times daily.  28 capsule  0  . clotrimazole (LOTRIMIN) 1 % cream Apply 1 application topically 2 (two) times daily.  30 g  0  . cyclobenzaprine (FLEXERIL) 10 MG tablet Take 1 tablet (10 mg total) by mouth 3 (three) times daily as needed for muscle spasms.  30 tablet  0  . Prenatal Vit-Fe Fumarate-FA (PRENATAL MULTIVITAMIN) TABS tablet Take 1 tablet by mouth daily at 12 noon.         Review of Systems   Constitutional: No acute issues. Painful contractions. Denies HA, vision change, SOB, endorses some N without emesis. No d/c, or other complaints today  Temperature 98.5 F (36.9 C), temperature source Oral, height 5\' 9"  (1.753 m),  weight 120.203 kg (265 lb), last menstrual period 06/16/2013. General appearance: alert, cooperative, appears stated age and no distress Lungs: clear to auscultation bilaterally Heart: regular rate and rhythm Abdomen: soft, non-tender; bowel sounds normal SSE: Copious yellow/green pooling. Cervix appears to be approximately 2-3cm on visual exam. White mucous Extremities: Homans sign is negative, no sign of DVT  Presentation: cephalic by US Fetal monitoringBaseline: 150s bpm, Variability: Good {> 6 bpm), Accelerations: 10x10 accel once and Decelerations: Absent Uterine activity q3-63min      Prenatal labs: ABO, Rh: O/POS/-- (12/10 1205) Antibody: NEG (12/10 1205) Rubella:  Imm RPR: NON REAC (12/10 1205)  HBsAg: NEGATIVE (12/10 1205)  HIV: NON REACTIVE (12/10 1205)  GBS:   Pending   Clinic  [facility]  Dating LMP/Ultrasound: [redacted]w[redacted]d  weeks         Ultrasound consistent with LMP: Yes  Genetic Screen                 NIPS: Low risk  Anatomic US Echogenic bowel - repeat 4/23  GTT Early:      71         Third trimester:   TDaP vaccine   Flu vaccine  At work 9/14 this fall   GBS   Baby Food  Breast  Contraception  mirena  Circumcision  Outpatient  Pediatrician  [facility]     Prenatal Transfer Tool  Maternal Diabetes: No Genetic Screening: Normal Maternal Ultrasounds/Referrals: Abnormal:  Findings:   Echogenic bowel Fetal Ultrasounds or other Referrals:  None Maternal Substance Abuse:  No Significant Maternal Medications:  None Significant Maternal Lab Results: GBS unknwon     No results found for this or any previous visit (from the past 24 hour(s)).  Assessment: Sandra Wong is a 28 y.o. G2P1001 at [redacted]w[redacted]d by L=6 here for PPROM.  #PPROM:  - mag 12hr - Amp/Erythro per PPROM protocol - Trendelenberg / bed rest - continuous monitoring. - BMZ 4/4 and 4/5 - NICU consult to discuss outcomes  #Pain: PRN IV pain meds, likely improve with mage #FWB: Reassuring and reactive. Continous monitoring at this time  Minta Balsam 01/03/2014, 12:48 AM

## 2014-01-03 NOTE — Progress Notes (Signed)
UR completed 

## 2014-01-04 LAB — RPR

## 2014-01-04 NOTE — Progress Notes (Signed)
Post Partum Day 2 Subjective: no complaints  Objective: Blood pressure 106/61, pulse 96, temperature 97.6 F (36.4 C), temperature source Oral, resp. rate 20, height 5\' 9"  (1.753 m), weight 265 lb (120.203 kg), last menstrual period 06/16/2013, SpO2 99.00%, unknown if currently breastfeeding.  Physical Exam:  General: alert, cooperative and appears stated age Lochia: appropriate Uterine Fundus: firm DVT Evaluation: No evidence of DVT seen on physical exam.   Recent Labs  01/03/14 0015  HGB 10.5*  HCT 32.5*    Assessment/Plan: Plan for discharge tomorrow, Breastfeeding and Contraception ? [place]   LOS: 2 days   Sandra Boresanya S Wong 01/04/2014, 7:47 AM

## 2014-01-04 NOTE — MAU Provider Note (Signed)
Attestation of Attending Supervision of Advanced Practitioner: Evaluation and management procedures were performed by the PA/NP/CNM/OB Fellow under my supervision/collaboration. Chart reviewed and agree with management and plan.  Tilda BurrowJohn V Donia Yokum 01/04/2014 7:45 AM

## 2014-01-04 NOTE — Progress Notes (Signed)
Clinical Social Work Department PSYCHOSOCIAL ASSESSMENT - MATERNAL/CHILD 01/04/2014  Patient:  Sandra Wong, Sandra Wong  Account Number:  [id number]  Admit Date:  01/02/2014  Sandra Wong    Clinical Social Worker:  Terri Piedra, LCSW   Date/Time:  01/04/2014 12:00 N  Date Referred:        Other referral source:   No referral-NICU admission    I:  FAMILY / [place] legal guardian:  PARENT  Guardian - Name Guardian - Age Guardian - Address  Sandra Wong [address] [place], [place], [place] [address]  Sandra Wong     Other household support members/support persons Name Relationship DOB  Sandra Wong SON 03/2008   Other support:   [name] states she has a great support system of family who all live near by.  She states FOB is also the father of her 32 year old and is involved and supportive.    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   IV  RISK FACTORS AND CURRENT PROBLEMS Current Problem:  None   Risk Factor & Current Problem Patient Issue Family Issue Risk Factor / Current Problem Comment    V  SOCIAL WORK ASSESSMENT  CSW met with [name] at baby's bedside to introduce myself and complete assessment for NICU admission of 28.5 week boy.  [name] had her 81 year old son with her and stated that CSW could stay  and talk with her.  She seemed to be in good spirits and states baby is doing well at this time.  She shared her birth story and states at this time she does not have much feeling surrounding it.  She states she feels like she is just here.  CSW thinks she may be in shock that baby has been born prematurely.  CSW prepared her for various feelings that may arise and encouraged her to allow herself to feel and process her emotions.  CSW explained that CSW is available to talk to throughout her baby's hospitalization if she feels she could benefit from processing her feelings.  She states she had PPD after her first son and saw a Social worker at [employer]  She states she could return if she felt it was needed.  CSW reviewed normal/common emotions in the first week or two of the PP period as well as signs and symptoms of PPD to watch for.  CSW asked [name] to talk with CSW or her doctor if she has concerns at any time or if symptoms arise.  She agreed.  She states she doesn't know what questions to ask the staff regarding the baby, because she has never had this experience before.  CSW validated these feelings and discussed what to expect from a NICU admission in general terms (nothing specific to her baby).  This seemed to help [name] understand  the situation better.  She states she has everything she needs for baby at home because she has already had her baby shower.  She states she has her own transportation and plans to visit daily.  [name] has questions about paperwork for her job and CSW explained she will need to talk with clinic staff in order to get LOA paperwork completed.  CSW informed [name] of baby's eligibility for SSI and assisted her in completing the application.  CSW will submit to the [place] as soon as the [employer] of Facts has been completed.  [name] seemed very appreciative of the visit.  CSW is not aware of any social concerns at this time.   VI SOCIAL WORK PLAN Social Work  Plan  Psychosocial Support/Ongoing Assessment of Needs  Patient/Family Education   Type of pt/family education:   Ongoing support services offered by NICU CSW  PPD signs and symptoms   If child protective services report - county:   If child protective services report - date:   Information/referral to community resources comment:   SSI   Other social work plan:

## 2014-01-05 ENCOUNTER — Ambulatory Visit: Payer: Self-pay

## 2014-01-05 MED ORDER — IBUPROFEN 600 MG PO TABS: 600.0000 mg | ORAL_TABLET | Freq: Four times a day (QID) | ORAL | Status: DC

## 2014-01-05 NOTE — Discharge Summary (Signed)
Obstetric Discharge Summary Reason for Admission: onset of labor and rupture of membranes Prenatal Procedures: none Intrapartum Procedures: spontaneous vaginal delivery Postpartum Procedures: none Complications-Operative and Postpartum: none Hemoglobin  Date Value Ref Range Status  01/03/2014 10.5* 12.0 - 15.0 g/dL Final     HCT  Date Value Ref Range Status  01/03/2014 32.5* 36.0 - 46.0 % Final    Physical Exam:  General: alert, cooperative and no distress Lochia: appropriate Uterine Fundus: firm DVT Evaluation: No evidence of DVT seen on physical exam.  Discharge Diagnoses: Preterm Delivery  Discharge Information: Date: 01/05/2014 Activity: pelvic rest Diet: routine Medications: PNV and Ibuprofen Condition: stable Instructions: refer to practice specific booklet Discharge to: home Follow-up Information   [facility] (for postpartum visit)    Contact information:   [address] [place] [id number] [phone]      Newborn Data: Live born female  Birth Weight: 2 lb 6.5 oz (1091 g) APGAR: 5, 6  In NICU.  Sandra Wong 01/05/2014, 7:11 AM

## 2014-01-05 NOTE — Lactation Note (Signed)
This note was copied from the chart of Sandra Taneia Pillsbury. Lactation Consultation Note Mom states she is pumping regularly and is getting good amounts colostrum. Mom has WIC appt on Wed 4/15.  WIC loaner set up for mom; $30 received; mom instructed to return the pump to [facility] by 4/17.  Answered questions; enc mom to call if she has any other concerns. Will follow in NICU.   Patient Name: Sandra Wong ZOXWR'UToday's Date: 01/05/2014     Maternal Data    Feeding Feeding Type: Formula Length of feed: 30 min  [facility]                      Lactation Tools Discussed/Used     Consult Status      Talmadge Coventrylizabeth F Larrie Lucia 01/05/2014, 10:41 AM

## 2014-01-05 NOTE — Progress Notes (Signed)
Discharge instructions provided to patient at bedside.  Activity, medications, follow up appointments, when to call the docotor and community resources discussed.  No questions at this time.  Patient left un int stable condition with all personal belongings and rented breast pump accompanied by staff  Osvaldo AngstK. Rye Decoste, RN-------------------

## 2014-01-05 NOTE — Discharge Instructions (Signed)

## 2014-01-07 ENCOUNTER — Ambulatory Visit: Payer: Self-pay

## 2014-01-07 NOTE — Lactation Note (Signed)
This note was copied from the chart of Sandra Demari Petitjean. Lactation Consultation Note Mom request assistance at NICU bedside for full, heavy, painful breasts and reduced milk volume with pumping. Assisted mom in the NICU pumping room, iced breasts for 15 minutes, assisted with pumping using DEP. Assisted with massage while pumping, and discussed flange placement on the breast. Larger flanges (size 30) provided for more comfort while pumping. Mom pumped about 30 ml total. Breasts much softer and more comfortable after pumping. Written instructions given to mom for prevention and treatment of engorgement.  Enc mom to call if she has any other concerns.   Patient Name: Sandra Wong Sandra Wong Reason for consult: Follow-up assessment;Breast/nipple pain   Consult Status Consult Status: Follow-up Follow-up type: In-patient    Sandra Wong Wong, 11:28 AM

## 2014-01-09 ENCOUNTER — Encounter: Payer: Medicaid Other | Admitting: Family Medicine

## 2014-01-09 ENCOUNTER — Ambulatory Visit (INDEPENDENT_AMBULATORY_CARE_PROVIDER_SITE_OTHER): Payer: Medicaid Other

## 2014-01-09 DIAGNOSIS — R300 Dysuria: Secondary | ICD-10-CM

## 2014-01-09 LAB — POCT URINALYSIS DIP (DEVICE)
Bilirubin Urine: NEGATIVE
Glucose, UA: NEGATIVE mg/dL
Ketones, ur: NEGATIVE mg/dL
Nitrite: NEGATIVE
PROTEIN: 30 mg/dL — AB
Specific Gravity, Urine: 1.02 (ref 1.005–1.030)
Urobilinogen, UA: 1 mg/dL (ref 0.0–1.0)
pH: 7 (ref 5.0–8.0)

## 2014-01-09 NOTE — Progress Notes (Signed)
Pt. In clinic today to give urine sample for questionable UTI- pt. Symptomatic. Urinalysis performed. Urine culture sent. Informed pt. She would be called if results are abnormal and RX prescribed.

## 2014-01-09 NOTE — Progress Notes (Signed)
I spoke with patient at front desk. She stated that she had fallen yesterday on her bottom. I asked her if she felt that she had injured herself and she stated that she didn't think so but just wanted us to know. I told her that she may want to be evaluated if she notices increased pain. Patient agreeable and stated that she doesn't want to go to be checked out at this time but will if she feels worse.

## 2014-01-12 LAB — URINE CULTURE: Colony Count: >=100000

## 2014-01-13 ENCOUNTER — Other Ambulatory Visit: Payer: Self-pay | Admitting: Obstetrics & Gynecology

## 2014-01-13 DIAGNOSIS — N390 Urinary tract infection, site not specified: Secondary | ICD-10-CM

## 2014-01-13 MED ORDER — CEPHALEXIN 500 MG PO CAPS: 500.0000 mg | ORAL_CAPSULE | Freq: Four times a day (QID) | ORAL | Status: DC

## 2014-01-14 ENCOUNTER — Encounter: Payer: Self-pay | Admitting: General Practice

## 2014-01-14 NOTE — Progress Notes (Signed)
Called patient, no answer- left message that we are trying to get in touch with her but will send a mychart message, if she does not get that please call us back at the clinics. Will message patient

## 2014-01-17 ENCOUNTER — Ambulatory Visit (HOSPITAL_COMMUNITY): Payer: Medicaid Other

## 2014-02-07 ENCOUNTER — Ambulatory Visit (INDEPENDENT_AMBULATORY_CARE_PROVIDER_SITE_OTHER): Payer: Medicaid Other | Admitting: Medical

## 2014-02-07 ENCOUNTER — Encounter: Payer: Self-pay | Admitting: Medical

## 2014-02-07 NOTE — Progress Notes (Signed)
Here for postpartum visit.  Wants to discuss birth control. Given pamphlets on various forms of birth control to discuss with provider.

## 2014-02-07 NOTE — Patient Instructions (Signed)
Contraception Choices °Birth control (contraception) is the use of any methods or devices to stop pregnancy from happening. Below are some methods to help avoid pregnancy. °HORMONAL BIRTH CONTROL °· A small tube put under the skin of the upper arm (implant). The tube can stay in place for 3 years. The implant must be taken out after 3 years. °· Shots given every 3 months. °· Pills taken every day. °· Patches that are changed once a week. °· A ring put into the vagina (vaginal ring). The ring is left in place for 3 weeks and removed for 1 week. Then, a new ring is put in the vagina. °· Emergency birth control pills taken after unprotected sex (intercourse). °BARRIER BIRTH CONTROL  °· A thin covering worn on the penis (female condom) during sex. °· A soft, loose covering put into the vagina (female condom) before sex. °· A rubber bowl that sits over the cervix (diaphragm). The bowl must be made for you. The bowl is put into the vagina before sex. The bowl is left in place for 6 to 8 hours after sex. °· A small, soft cup that fits over the cervix (cervical cap). The cup must be made for you. The cup can be left in place for 48 hours after sex. °· A sponge that is put into the vagina before sex. °· A chemical that kills or stops sperm from getting into the cervix and uterus (spermicide). The chemical may be a cream, jelly, foam, or pill. °INTRAUTERINE (IUD) BIRTH CONTROL  °· IUD birth control is a small, T-shaped piece of plastic. The plastic is put inside the uterus. There are 2 types of IUD: °· Copper IUD. The IUD is covered in copper wire. The copper makes a fluid that kills sperm. It can stay in place for 10 years. °· Hormone IUD. The hormone stops pregnancy from happening. It can stay in place for 5 years. °PERMANENT METHODS °· When the woman has her fallopian tubes sealed, tied, or blocked during surgery. This stops the egg from traveling to the uterus. °· The doctor places a small coil or insert into each fallopian  tube. This causes scar tissue to form and blocks the fallopian tubes. °· When the female has the tubes that carry sperm tied off (vasectomy). °NATURAL FAMILY PLANNING BIRTH CONTROL  °· Natural family planning means not having sex or using barrier birth control on the days the woman could become pregnant. °· Use a calendar to keep track of the length of each period and know the days she can get pregnant. °· Avoid sex during ovulation. °· Use a thermometer to measure body temperature. Also watch for symptoms of ovulation. °· Time sex to be after the woman has ovulated. °Use condoms to help protect yourself against sexually transmitted infections (STIs). Do this no matter what type of birth control you use. Talk to your doctor about which type of birth control is best for you. °Document Released: 07/11/2009 Document Revised: 05/16/2013 Document Reviewed: 04/04/2013 °ExitCare® Patient Information ©2014 [employer]. ° °

## 2014-02-07 NOTE — Progress Notes (Signed)
Patient ID: Sandra RetortShanikwa Music, female   DOB: 08-13-1986, 28 y.o.   MRN: [id number] Subjective:     Sandra Wong is a 28 y.o. female who presents for a postpartum visit. She is 5 weeks postpartum following a spontaneous vaginal delivery. I have fully reviewed the prenatal and intrapartum course. The delivery was at 28.5 gestational weeks. Outcome: spontaneous vaginal delivery. Anesthesia: none. Postpartum course has been normal. Baby's course has been complicated by prematurity, low birth weight. Baby is still in NICU. Baby is feeding by patient has pumped, but stopped this week. . Bleeding no bleeding. Bowel function is normal. Requests stool softener to avoid hemorrhoids. Bladder function is normal. Patient is sexually active. Contraception method is condoms. Postpartum depression screening: negative.  The following portions of the patient's history were reviewed and updated as appropriate: allergies, current medications, past family history, past medical history, past social history, past surgical history and problem list.  Review of Systems Pertinent items are noted in HPI.   Objective:    BP 120/76  Pulse 66  Temp(Src) 98.1 F (36.7 C)  Ht 5\' 9"  (1.753 m)  Wt 259 lb (117.482 kg)  BMI 38.23 kg/m2  Breastfeeding? Yes  General:  alert and cooperative   Breasts:  not evaluated  Lungs: clear to auscultation bilaterally  Heart:  regular rate and rhythm, S1, S2 normal, no murmur, click, rub or gallop  Abdomen: soft, non-tender; bowel sounds normal; no masses,  no organomegaly   Vulva:  normal well-healing tear from SVD  Vagina: normal vagina, no discharge, exudate, lesion, or erythema scant light bleeding  Cervix:  no cervical motion tenderness and no lesions  Corpus: normal size, contour, position, consistency, mobility, non-tender  Adnexa:  normal adnexa and no mass, fullness, tenderness  Rectal Exam: Not performed.        Assessment:     Normal postpartum exam. Pap smear not  done at today's visit. Last pap smear was 06/18/13 and normal. HPV was not performed.   Plan:    1. Contraception: condoms. Patient given literature and birth control choices were discussed. She will contact [facility] when she has decided which birth control option she would prefer   Freddi StarrJulie N Ethier, PA-C 02/07/2014 1:40 PM

## 2014-02-22 ENCOUNTER — Telehealth: Payer: Self-pay | Admitting: *Deleted

## 2014-02-22 NOTE — Telephone Encounter (Signed)
Pt called nurse line requesting leave papers for her since her son was just discharged from the hospital.  No FMLA papers in the box, patient will need to bring [place] papers and sign ROI if she has not already done so.

## 2014-02-25 ENCOUNTER — Telehealth: Payer: Self-pay | Admitting: *Deleted

## 2014-02-25 ENCOUNTER — Encounter: Payer: Self-pay | Admitting: Obstetrics and Gynecology

## 2014-02-25 NOTE — Telephone Encounter (Signed)
Sandra Wong left a message she is returning our call.  Per chart review do not see a call - do see that she picked up LOA letter.  Called [place] and left a message if you still have a need to please call clinic and leave a detailed message of how we may help you.

## 2014-02-25 NOTE — Telephone Encounter (Signed)
Patient came to clinic in person. Letter was given.

## 2014-07-10 ENCOUNTER — Encounter (HOSPITAL_COMMUNITY): Payer: Self-pay | Admitting: Emergency Medicine

## 2014-07-10 ENCOUNTER — Emergency Department (HOSPITAL_COMMUNITY)
Admission: EM | Admit: 2014-07-10 | Discharge: 2014-07-10 | Disposition: A | Discharge status: Home / Self Care | Payer: Medicaid Other | Source: Home / Self Care | Attending: Family Medicine | Admitting: Family Medicine

## 2014-07-10 DIAGNOSIS — Z331 Pregnant state, incidental: Secondary | ICD-10-CM

## 2014-07-10 DIAGNOSIS — J302 Other seasonal allergic rhinitis: Secondary | ICD-10-CM

## 2014-07-10 DIAGNOSIS — Z3201 Encounter for pregnancy test, result positive: Secondary | ICD-10-CM

## 2014-07-10 LAB — POCT URINALYSIS DIP (DEVICE)
Bilirubin Urine: NEGATIVE
Glucose, UA: NEGATIVE mg/dL
Ketones, ur: NEGATIVE mg/dL
Leukocytes, UA: NEGATIVE
NITRITE: NEGATIVE
PH: 7 (ref 5.0–8.0)
Protein, ur: NEGATIVE mg/dL
SPECIFIC GRAVITY, URINE: 1.025 (ref 1.005–1.030)
UROBILINOGEN UA: 0.2 mg/dL (ref 0.0–1.0)

## 2014-07-10 LAB — POCT PREGNANCY, URINE: Preg Test, Ur: POSITIVE — AB

## 2014-07-10 MED ORDER — DIPHENHYDRAMINE HCL 25 MG PO TABS: 25.0000 mg | ORAL_TABLET | Freq: Four times a day (QID) | ORAL | Status: DC | PRN

## 2014-07-10 NOTE — ED Provider Notes (Signed)
CSN: [id number]     Arrival date & time 07/10/14  1655 History   First MD Initiated Contact with Patient 07/10/14 1727     Chief Complaint  Patient presents with  . URI   (Consider location/radiation/quality/duration/timing/severity/associated sxs/prior Treatment) Patient is a 28 y.o. female presenting with URI. The history is provided by the patient.  URI Presenting symptoms: congestion, cough and rhinorrhea   Presenting symptoms: no fever   Severity:  Mild Onset quality:  Gradual Duration:  2 weeks Chronicity:  New Relieved by:  None tried Worsened by:  Nothing tried Ineffective treatments:  OTC medications Associated symptoms: sneezing   Associated symptoms: no wheezing   Risk factors: no recent illness and no sick contacts     Past Medical History  Diagnosis Date  . Headache(784.0)   . Anxiety   . Anemia 2009    s/p delivery   Past Surgical History  Procedure Laterality Date  . No past surgeries     Family History  Problem Relation Age of Onset  . Hypertension Mother   . Hypertension Paternal Aunt   . Cancer Maternal Grandfather    History  Substance Use Topics  . Smoking status: Former Smoker -- 0.50 packs/day    Quit date: 06/08/2013  . Smokeless tobacco: Never Used  . Alcohol Use: Yes     Comment: occasional   OB History   Grav Para Term Preterm Abortions TAB SAB Ect Mult Living   2 2 1 1      2      Review of Systems  Constitutional: Negative.  Negative for fever.  HENT: Positive for congestion, postnasal drip, rhinorrhea and sneezing.   Respiratory: Positive for cough. Negative for shortness of breath and wheezing.   Cardiovascular: Negative.     Allergies  Other  Home Medications   Prior to Admission medications   Medication Sig Start Date End Date Taking? Authorizing Provider  diphenhydrAMINE (BENADRYL) 25 MG tablet Take 1 tablet (25 mg total) by mouth every 6 (six) hours as needed. 07/10/14   Linna HoffJames D Mertice Uffelman, MD  Iron TABS Take 1 tablet  by mouth daily.    Historical Provider, MD  Prenatal Vit-Fe Fumarate-FA (PRENATAL MULTIVITAMIN) TABS tablet Take 1 tablet by mouth daily at 12 noon.    Historical Provider, MD   BP 137/77  Pulse 92  Temp(Src) 97.9 F (36.6 C) (Oral)  Resp 20  SpO2 98%  Breastfeeding? No Physical Exam  Nursing note and vitals reviewed. Constitutional: She is oriented to person, place, and time. She appears well-developed and well-nourished. No distress.  HENT:  Head: Normocephalic.  Right Ear: External ear normal.  Left Ear: External ear normal.  Mouth/Throat: Oropharynx is clear and moist.  Eyes: Pupils are equal, round, and reactive to light.  Neck: Normal range of motion. Neck supple.  Pulmonary/Chest: Effort normal and breath sounds normal.  Lymphadenopathy:    She has no cervical adenopathy.  Neurological: She is alert and oriented to person, place, and time.  Skin: Skin is warm and dry.    ED Course  Procedures (including critical care time) Labs Review Labs Reviewed  POCT URINALYSIS DIP (DEVICE) - Abnormal; Notable for the following:    Hgb urine dipstick TRACE (*)    All other components within normal limits  POCT PREGNANCY, URINE - Abnormal; Notable for the following:    Preg Test, Ur POSITIVE (*)    All other components within normal limits    Imaging Review No results found.  MDM   1. Seasonal allergic rhinitis   2. Pregnancy as incidental finding    Also lmp was 8/20 concern about preg, is 44mo postpartum., no birth control.    Linna HoffJames D Deray Dawes, MD 07/10/14 [phone]

## 2014-07-10 NOTE — ED Notes (Signed)
C/o cold sx onset 2 weeks Sx include: PND, nauseas, productive cough Also concerned about late menstrual cylce = 05/16/2014 Alert, no signs of acute distress.

## 2014-07-29 ENCOUNTER — Encounter (HOSPITAL_COMMUNITY): Payer: Self-pay | Admitting: Emergency Medicine

## 2014-09-27 NOTE — L&D Delivery Note (Cosign Needed)
Nira RetortShanikwa Macken is 29 y.o. [phone] @ [id number] who presented in advanced active labor. Patient pushed and baby was delivered with two pushes. Cord avulsed with delivery of placenta, placenta fortunately in vagina and was removed manually. Single sweep of uterus removed large clot, no retained products. Cytotec 400 buccal given in addition to pitocin at time of cord avulsion in preparation for possible increased blood loss.  Delivery Note At 12:45 AM a viable female was delivered via Vaginal, Spontaneous Delivery (Presentation: ; Occiput Anterior).  APGAR: 9, 10; weight 6 lb 12.6 oz (3080 g).   Placenta status: Intact, Expressed.  Cord: 3 vessels with the following complications: cord avulsion.  Cord pH: not collected   Anesthesia: None  Episiotomy: None Lacerations: None Suture Repair: None Est. Blood Loss (mL): 250  Mom to postpartum.  Baby to Couplet care / Skin to Skin.  Asiyah Z Mikell 09/10/2015, 4:15 AM  OB FELLOW DELIVERY ATTESTATION  I was gloved and present for the delivery in its entirety, and I agree with the above resident's note.    Silvano BilisNoah B Baila Rouse, MD 4:45 AM  \Marland Kitchen

## 2014-10-09 ENCOUNTER — Emergency Department (HOSPITAL_COMMUNITY): Payer: Medicaid Other

## 2014-10-09 ENCOUNTER — Emergency Department (HOSPITAL_COMMUNITY)
Admission: EM | Admit: 2014-10-09 | Discharge: 2014-10-09 | Disposition: A | Discharge status: Home / Self Care | Payer: Medicaid Other | Attending: Emergency Medicine | Admitting: Emergency Medicine

## 2014-10-09 ENCOUNTER — Encounter (HOSPITAL_COMMUNITY): Payer: Self-pay | Admitting: Emergency Medicine

## 2014-10-09 DIAGNOSIS — Z87891 Personal history of nicotine dependence: Secondary | ICD-10-CM | POA: Diagnosis not present

## 2014-10-09 DIAGNOSIS — Z79899 Other long term (current) drug therapy: Secondary | ICD-10-CM | POA: Insufficient documentation

## 2014-10-09 DIAGNOSIS — Y9389 Activity, other specified: Secondary | ICD-10-CM | POA: Diagnosis not present

## 2014-10-09 DIAGNOSIS — Z8659 Personal history of other mental and behavioral disorders: Secondary | ICD-10-CM | POA: Diagnosis not present

## 2014-10-09 DIAGNOSIS — M7918 Myalgia, other site: Secondary | ICD-10-CM

## 2014-10-09 DIAGNOSIS — Y92410 Unspecified street and highway as the place of occurrence of the external cause: Secondary | ICD-10-CM | POA: Insufficient documentation

## 2014-10-09 DIAGNOSIS — Y998 Other external cause status: Secondary | ICD-10-CM | POA: Diagnosis not present

## 2014-10-09 DIAGNOSIS — S3992XA Unspecified injury of lower back, initial encounter: Secondary | ICD-10-CM | POA: Diagnosis not present

## 2014-10-09 DIAGNOSIS — E669 Obesity, unspecified: Secondary | ICD-10-CM | POA: Insufficient documentation

## 2014-10-09 DIAGNOSIS — S199XXA Unspecified injury of neck, initial encounter: Secondary | ICD-10-CM | POA: Insufficient documentation

## 2014-10-09 DIAGNOSIS — S3993XA Unspecified injury of pelvis, initial encounter: Secondary | ICD-10-CM | POA: Insufficient documentation

## 2014-10-09 DIAGNOSIS — D649 Anemia, unspecified: Secondary | ICD-10-CM | POA: Diagnosis not present

## 2014-10-09 HISTORY — DX: Obesity, unspecified: E66.9

## 2014-10-09 HISTORY — DX: Reserved for inherently not codable concepts without codable children: IMO0001

## 2014-10-09 HISTORY — DX: Complete or unspecified spontaneous abortion without complication: O03.9

## 2014-10-09 MED ORDER — HYDROCODONE-ACETAMINOPHEN 5-325 MG PO TABS
1.0000 | ORAL_TABLET | Freq: Once | ORAL | Status: AC
  Administered 2014-10-09: 1 via ORAL
  Filled 2014-10-09: qty 1

## 2014-10-09 MED ORDER — HYDROCODONE-ACETAMINOPHEN 5-325 MG PO TABS: ORAL_TABLET | ORAL | Status: DC
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
## 2014-10-09 NOTE — Discharge Instructions (Signed)
Take vicodin for breakthrough pain, do not drink alcohol, drive, care for children or do other critical tasks while taking vicodin. ° °Please follow with your primary care doctor in the next 2 days for a check-up. They must obtain records for further management.  ° °Do not hesitate to return to the Emergency Department for any new, worsening or concerning symptoms.  ° °
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
## 2014-12-12 ENCOUNTER — Encounter (HOSPITAL_COMMUNITY): Payer: Self-pay

## 2014-12-12 ENCOUNTER — Emergency Department (HOSPITAL_COMMUNITY): Payer: Medicaid Other

## 2014-12-12 ENCOUNTER — Emergency Department (HOSPITAL_COMMUNITY)
Admission: EM | Admit: 2014-12-12 | Discharge: 2014-12-12 | Disposition: A | Discharge status: Home / Self Care | Payer: Medicaid Other | Attending: Emergency Medicine | Admitting: Emergency Medicine

## 2014-12-12 DIAGNOSIS — R05 Cough: Secondary | ICD-10-CM | POA: Diagnosis not present

## 2014-12-12 DIAGNOSIS — E669 Obesity, unspecified: Secondary | ICD-10-CM | POA: Insufficient documentation

## 2014-12-12 DIAGNOSIS — D649 Anemia, unspecified: Secondary | ICD-10-CM | POA: Diagnosis not present

## 2014-12-12 DIAGNOSIS — Z8659 Personal history of other mental and behavioral disorders: Secondary | ICD-10-CM | POA: Diagnosis not present

## 2014-12-12 DIAGNOSIS — R079 Chest pain, unspecified: Secondary | ICD-10-CM

## 2014-12-12 DIAGNOSIS — Z87891 Personal history of nicotine dependence: Secondary | ICD-10-CM | POA: Diagnosis not present

## 2014-12-12 DIAGNOSIS — Z79899 Other long term (current) drug therapy: Secondary | ICD-10-CM | POA: Insufficient documentation

## 2014-12-12 DIAGNOSIS — Z3202 Encounter for pregnancy test, result negative: Secondary | ICD-10-CM | POA: Diagnosis not present

## 2014-12-12 DIAGNOSIS — R0602 Shortness of breath: Secondary | ICD-10-CM | POA: Insufficient documentation

## 2014-12-12 DIAGNOSIS — R51 Headache: Secondary | ICD-10-CM | POA: Insufficient documentation

## 2014-12-12 LAB — CBC
HCT: 39.1 % (ref 36.0–46.0)
HEMOGLOBIN: 12.5 g/dL (ref 12.0–15.0)
MCH: 26.9 pg (ref 26.0–34.0)
MCHC: 32 g/dL (ref 30.0–36.0)
MCV: 84.1 fL (ref 78.0–100.0)
Platelets: 241 10*3/uL (ref 150–400)
RBC: 4.65 MIL/uL (ref 3.87–5.11)
RDW: 15 % (ref 11.5–15.5)
WBC: 7.3 10*3/uL (ref 4.0–10.5)

## 2014-12-12 LAB — BASIC METABOLIC PANEL
Anion gap: 9 (ref 5–15)
BUN: 13 mg/dL (ref 6–23)
CALCIUM: 8.6 mg/dL (ref 8.4–10.5)
CO2: 24 mmol/L (ref 19–32)
CREATININE: 0.68 mg/dL (ref 0.50–1.10)
Chloride: 104 mmol/L (ref 96–112)
GFR calc Af Amer: >90 mL/min (ref >90)
Glucose, Bld: 89 mg/dL (ref 70–99)
Potassium: 3.8 mmol/L (ref 3.5–5.1)
Sodium: 137 mmol/L (ref 135–145)

## 2014-12-12 LAB — I-STAT TROPONIN, ED: Troponin i, poc: 0 ng/mL (ref 0.00–0.08)

## 2014-12-12 LAB — POC URINE PREG, ED: Preg Test, Ur: NEGATIVE

## 2014-12-12 MED ORDER — IBUPROFEN 200 MG PO TABS
600.0000 mg | ORAL_TABLET | Freq: Once | ORAL | Status: AC
  Administered 2014-12-12: 600 mg via ORAL
  Filled 2014-12-12: qty 3

## 2014-12-12 NOTE — ED Provider Notes (Signed)
CSN: [id number]     Arrival date & time 12/12/14  1058 History   First MD Initiated Contact with Patient 12/12/14 1251     Chief Complaint  Patient presents with  . Chest Pain  . Cough     (Consider location/radiation/quality/duration/timing/severity/associated sxs/prior Treatment) Patient is a 29 y.o. female presenting with chest pain.  Chest Pain Pain location:  Substernal area Pain quality: sharp   Pain radiates to:  Does not radiate Pain severity:  Severe Onset quality:  Sudden Duration: about an hour. Timing:  Constant Progression:  Partially resolved Chronicity:  New Context: stress   Relieved by:  Rest Worsened by:  Nothing tried Associated symptoms: anxiety, cough and shortness of breath   Associated symptoms: no nausea and not vomiting   Associated symptoms comment:  Headache   Past Medical History  Diagnosis Date  . Headache(784.0)   . Anxiety   . Anemia 2009    s/p delivery  . Obesity   . Abortion    Past Surgical History  Procedure Laterality Date  . No past surgeries    . Dilation and curettage of uterus     Family History  Problem Relation Age of Onset  . Hypertension Mother   . Hypertension Paternal Aunt   . Cancer Maternal Grandfather    History  Substance Use Topics  . Smoking status: Former Smoker -- 0.50 packs/day    Quit date: 06/08/2013  . Smokeless tobacco: Never Used  . Alcohol Use: Yes     Comment: occasional   OB History    Gravida Para Term Preterm AB TAB SAB Ectopic Multiple Living   2 2 1 1      2      Review of Systems  Respiratory: Positive for cough and shortness of breath.   Cardiovascular: Positive for chest pain.  Gastrointestinal: Negative for nausea and vomiting.  All other systems reviewed and are negative.     Allergies  Other  Home Medications   Prior to Admission medications   Medication Sig Start Date End Date Taking? Authorizing Provider  diphenhydrAMINE (BENADRYL) 25 MG tablet Take 1 tablet (25 mg  total) by mouth every 6 (six) hours as needed. 07/10/14   Linna HoffJames D Kindl, MD  HYDROcodone-acetaminophen (NORCO/VICODIN) 5-325 MG per tablet Take 1-2 tablets by mouth every 6 hours as needed for pain and/or cough. 10/09/14   Nicole Pisciotta, PA-C  Iron TABS Take 1 tablet by mouth daily.    Historical Provider, MD  Prenatal Vit-Fe Fumarate-FA (PRENATAL MULTIVITAMIN) TABS tablet Take 1 tablet by mouth daily at 12 noon.    Historical Provider, MD   BP 115/86 mmHg  Pulse 87  Temp(Src) 97.7 F (36.5 C) (Oral)  Resp 20  SpO2 99%  LMP 11/27/2014 (Approximate) Physical Exam  Constitutional: She is oriented to person, place, and time. She appears well-developed and well-nourished. No distress.  HENT:  Head: Normocephalic and atraumatic.  Mouth/Throat: Oropharynx is clear and moist.  Eyes: Conjunctivae are normal. Pupils are equal, round, and reactive to light. No scleral icterus.  Neck: Neck supple.  Cardiovascular: Normal rate, regular rhythm, normal heart sounds and intact distal pulses.   No murmur heard. Pulmonary/Chest: Effort normal and breath sounds normal. No stridor. No respiratory distress. She has no wheezes. She has no rales.  Abdominal: Soft. Bowel sounds are normal. She exhibits no distension. There is no tenderness. There is no rebound and no guarding.  Musculoskeletal: Normal range of motion.  Neurological: She is alert and oriented  to person, place, and time.  Skin: Skin is warm and dry. No rash noted.  Psychiatric: She has a normal mood and affect. Her behavior is normal.  Nursing note and vitals reviewed.   ED Course  Procedures (including critical care time) Labs Review Labs Reviewed  CBC  BASIC METABOLIC PANEL  I-STAT TROPOININ, ED  POC URINE PREG, ED    Imaging Review Dg Chest 2 View (if Patient Has Fever And/or Copd)  12/12/2014   CLINICAL DATA:  Cough, congestion shortness of breath for 2 days, new onset sharp mid chest pain this morning, occasional smoker   EXAM: CHEST  2 VIEW  COMPARISON:  None  FINDINGS: Enlargement of cardiac silhouette.  Mediastinal contours and pulmonary vascularity normal.  Lungs clear.  No pleural effusion or pneumothorax.  Bones unremarkable.  IMPRESSION: Enlargement of cardiac silhouette.  No addition abnormalities identified.   Electronically Signed   By: Ulyses Southward M.D.   On: 12/12/2014 12:36     EKG Interpretation   Date/Time:  Thursday December 12 2014 13:48:05 EDT Ventricular Rate:  79 PR Interval:  142 QRS Duration: 92 QT Interval:  402 QTC Calculation: 460 R Axis:   83 Text Interpretation:  Normal sinus rhythm Normal ECG No old tracing to  compare Confirmed by [facility]  MD, TREY ([id number]) on 12/12/2014 1:59:20 PM      MDM   Final diagnoses:  Chest pain, unspecified chest pain type    29 yo obese female presenting with chest pain, shortness of breath, and headache.  I suspect that her symptoms were to a degree related to anxiety, although she has also been coughing.  EKG, CXR, and labs are negative.  Her history is inconsistent with ACS.  Her history is inconsistent with PE and she is PERC negative.  I suspect she may also have a component of sleep apnea, possibly contributing to her near daily headaches and tiredness.  I recommended close PCP follow up.      Blake Divine, MD 12/12/14 1455

## 2014-12-12 NOTE — ED Notes (Signed)
Pt alert, oriented, and ambulatory upon DC. She was advised to follow up with PCP (resource guide provided).

## 2014-12-12 NOTE — Discharge Instructions (Signed)
Chest Pain (Nonspecific) °It is often hard to give a specific diagnosis for the cause of chest pain. There is always a chance that your pain could be related to something serious, such as a heart attack or a blood clot in the lungs. You need to follow up with your health care provider for further evaluation. °CAUSES  °· Heartburn. °· Pneumonia or bronchitis. °· Anxiety or stress. °· Inflammation around your heart (pericarditis) or lung (pleuritis or pleurisy). °· A blood clot in the lung. °· A collapsed lung (pneumothorax). It can develop suddenly on its own (spontaneous pneumothorax) or from trauma to the chest. °· Shingles infection (herpes zoster virus). °The chest wall is composed of bones, muscles, and cartilage. Any of these can be the source of the pain. °· The bones can be bruised by injury. °· The muscles or cartilage can be strained by coughing or overwork. °· The cartilage can be affected by inflammation and become sore (costochondritis). °DIAGNOSIS  °Lab tests or other studies may be needed to find the cause of your pain. Your health care provider may have you take a test called an ambulatory electrocardiogram (ECG). An ECG records your heartbeat patterns over a 24-hour period. You may also have other tests, such as: °· Transthoracic echocardiogram (TTE). During echocardiography, sound waves are used to evaluate how blood flows through your heart. °· Transesophageal echocardiogram (TEE). °· Cardiac monitoring. This allows your health care provider to monitor your heart rate and rhythm in real time. °· Holter monitor. This is a portable device that records your heartbeat and can help diagnose heart arrhythmias. It allows your health care provider to track your heart activity for several days, if needed. °· Stress tests by exercise or by giving medicine that makes the heart beat faster. °TREATMENT  °· Treatment depends on what may be causing your chest pain. Treatment may include: °¨ Acid blockers for  heartburn. °¨ Anti-inflammatory medicine. °¨ Pain medicine for inflammatory conditions. °¨ Antibiotics if an infection is present. °· You may be advised to change lifestyle habits. This includes stopping smoking and avoiding alcohol, caffeine, and chocolate. °· You may be advised to keep your head raised (elevated) when sleeping. This reduces the chance of acid going backward from your stomach into your esophagus. °Most of the time, nonspecific chest pain will improve within 2-3 days with rest and mild pain medicine.  °HOME CARE INSTRUCTIONS  °· If antibiotics were prescribed, take them as directed. Finish them even if you start to feel better. °· For the next few days, avoid physical activities that bring on chest pain. Continue physical activities as directed. °· Do not use any tobacco products, including cigarettes, chewing tobacco, or electronic cigarettes. °· Avoid drinking alcohol. °· Only take medicine as directed by your health care provider. °· Follow your health care provider's suggestions for further testing if your chest pain does not go away. °· Keep any follow-up appointments you made. If you do not go to an appointment, you could develop lasting (chronic) problems with pain. If there is any problem keeping an appointment, call to reschedule. °SEEK MEDICAL CARE IF:  °· Your chest pain does not go away, even after treatment. °· You have a rash with blisters on your chest. °· You have a fever. °SEEK IMMEDIATE MEDICAL CARE IF:  °· You have increased chest pain or pain that spreads to your arm, neck, jaw, back, or abdomen. °· You have shortness of breath. °· You have an increasing cough, or you cough   up blood. °· You have severe back or abdominal pain. °· You feel nauseous or vomit. °· You have severe weakness. °· You faint. °· You have chills. °This is an emergency. Do not wait to see if the pain will go away. Get medical help at once. Call your local emergency services (911 in [place] Do not drive  yourself to the hospital. °MAKE SURE YOU:  °· Understand these instructions. °· Will watch your condition. °· Will get help right away if you are not doing well or get worse. °Document Released: 06/23/2005 Document Revised: 09/18/2013 Document Reviewed: 04/18/2008 °ExitCare® Patient Information ©2015 [employer]. This information is not intended to replace advice given to you by your health care provider. Make sure you discuss any questions you have with your health care provider. ° ° °Emergency Department Resource Guide °1) Find a Doctor and Pay Out of Pocket °Although you won't have to find out who is covered by your insurance plan, it is a good idea to ask around and get recommendations. You will then need to call the office and see if the doctor you have chosen will accept you as a new patient and what types of options they offer for patients who are self-pay. Some doctors offer discounts or will set up payment plans for their patients who do not have insurance, but you will need to ask so you aren't surprised when you get to your appointment. ° °2) Contact Your Local Health Department °Not all health departments have doctors that can see patients for sick visits, but many do, so it is worth a call to see if yours does. If you don't know where your local health department is, you can check in your phone book. The CDC also has a tool to help you locate your state's health department, and many state websites also have listings of all of their local health departments. ° °3) Find a Walk-in Clinic °If your illness is not likely to be very severe or complicated, you may want to try a walk in clinic. These are popping up all over the country in pharmacies, drugstores, and shopping centers. They're usually staffed by nurse practitioners or physician assistants that have been trained to treat common illnesses and complaints. They're usually fairly quick and inexpensive. However, if you have serious medical issues or  chronic medical problems, these are probably not your best option. ° °No Primary Care Doctor: °- Call [facility] at  832-8000 - they can help you locate a primary care doctor that  accepts your insurance, provides certain services, etc. °- Physician Referral Service- 1-800-533-3463 ° °Chronic Pain Problems: °Organization         Address  Phone   Notes  °[facility]  (336) 297-2271 Patients need to be referred by their primary care doctor.  ° °Medication Assistance: °Organization         Address  Phone   Notes  °[place] [facility] [address] °[place], [place] [address] (336) 641-8030 --Must be a resident of [place] °-- Must have NO insurance coverage whatsoever (no Medicaid/ Medicare, etc.) °-- The pt. MUST have a primary care doctor that directs their care regularly and follows them in the community °  °[facility]  (866) 331-1348   °[employer]  (888) 892-1162   ° °Agencies that provide inexpensive medical care: °Organization         Address  Phone   Notes  °[facility]  (  336) 832-8035   °[facility]    (336) 832-7272   °[facility] [address] °[place], [place] [address] (336) 832-4777   °[facility] [address], °[place] (336) 271-4999   °[facility]    (336) 373-0678   °[facility]    (336) 272-1050   °[facility] ° [address], [place] Phone:  (336) 832-4444, Fax:  (336) 832-4440 Hours of Operation:  9 am - 6 pm, M-F.  Also accepts Medicaid/Medicare and self-pay.  °[facility] ° [address], [place] Phone: (336) 832-3150, Fax: (336) 832-3151. Hours of Operation:  8:30 am - 5:30 pm, M-F.  Also accepts Medicaid and self-pay.  °[facility] [place] [address], [place] Phone: (336) 878-6027   °[facility] [address], [place], [place]  (336)723-1848, Ext. 123 Mondays & Thursdays: 7-9 AM.  First 15 patients are seen on a first come, first serve basis. °  ° °Medicaid-accepting [place] Providers: ° °Organization         Address  Phone   Notes  °[facility] [address], [place] (336) 641-2100 Also accepts self-pay patients.  °[facility] [address], [place] ° (336) 856-9996   °[facility] [address], [place] (336) 288-8857   °[facility] [address] [place] [address], [place] (336) 299-7000   °Veita Bland [address], [place]  ° (336) 373-1557 Only accepts [place] Access Medicaid patients after they have their name applied to their card.  ° °Self-Pay (no insurance) in [place]: ° °Organization         Address  Phone   Notes  °Sickle Cell Patients, [facility] [address], [place] (336) 832-1970   °[facility] [address], [place] (336) 832-4400   ° [facility] ° [address], [place] (336) 992-4800   °[facility]/Dr. Osei-Bonsu ° [address] [place] [address], [place] or [address], [place] (336) [phone] Phone number for both [place] and [place] locations is the same.  °[facility] [address], [place] (336) 299-0000   °[facility] [address] [place] [address], [place] or [address] (336) 852-7530 °(336) 878-2260   °[facility] [address], [place] (336) 350-1642, phone; (336) 294-5005, fax Sees patients 1st and 3rd Saturday of every month.  Must not qualify for public or private insurance (i.e. Medicaid, Medicare, [place] Health Choice, Veterans' Benefits) • Household income should be no more than 200% of the poverty level •The clinic cannot treat you if you are pregnant or think you are pregnant • Sexually transmitted  diseases are not treated at the clinic.  ° ° °Dental Care: °Organization         Address  Phone  Notes  °[facility] [address],  (336) 641-6152 Accepts children up to age 21 who are enrolled in Medicaid or [place] Health Choice; pregnant women with a Medicaid card; and children who have applied for Medicaid or [place] Health Choice, but were declined, whose parents can pay a reduced fee at time of service.  °[facility] [place]    [address], [place] (336) 641-7733 Accepts children up to age 21 who are enrolled in Medicaid or [place] Health Choice; pregnant women with a Medicaid card; and children who have applied for Medicaid or [place] Health Choice, but were declined, whose parents can pay a reduced fee at time of service.  °[facility] ° [address], [place] (336) 641-4533 Patients are seen by appointment only. Walk-ins are not accepted. [facility] will see patients 18 years of age and older. °Monday - Tuesday (8am-5pm) °Most Wednesdays (8:30-5pm) °$30 per visit, cash only  °[facility] ° [address], [place] (336) 641-4533 Patients are seen by appointment only. Walk-ins are not accepted. [facility] will see patients 18 years of age and older. °One Wednesday Evening (Monthly: Volunteer Based).  $30 per visit, cash only  °[facility]  (919) 537-3737 for adults; Children under age 4, call Graduate Pediatric Dentistry at ([phone]) [phone]. Children aged 4-14, please call (919) [phone] to request a pediatric application. ° Dental services are provided in all areas of dental care including fillings, crowns and bridges, complete and partial dentures, implants, gum treatment, root canals, and extractions. Preventive care is also provided. Treatment is provided to both adults and children. °Patients are selected via a  lottery and there is often a waiting list. °  °[facility] [address], °[place] ° (336) 763-8833 [email or web address] °  °[facility] [address], [place], [place] (336)723-1848, Ext. 123 Second and Fourth Thursday of each month, opens at 6:30 AM; Clinic ends at 9 AM.  Patients are seen on a first-come first-served basis, and a limited number are seen during each clinic.  ° °[facility] ° [address], [place], [place] (336) 723-7904   Eligibility Requirements °You must have lived in [place], [place], or [place] counties for at least the last three months. °  You cannot be eligible for state or federal sponsored healthcare insurance, including Veterans Administration, Medicaid, or Medicare. °  You generally cannot be eligible for healthcare insurance through your employer.  °  How to apply: °Eligibility screenings are held every Tuesday and Wednesday afternoon from 1:00 pm until 4:00 pm. You do not need an appointment for the interview!  °[facility] [address], [place], [place] 336-631-2330   °[facility]  336-342-8273   °[facility]  336-703-3100   °[facility]  336-570-6415   ° °Behavioral Health Resources in the Community: °Intensive Outpatient Programs °Organization         Address  Phone  Notes  °[facility] [address], [place], [place] 336-878-6098   °[facility] [address], [place], [place] 336-832-9800   °[facility] [address], [place], [place] ° 336-882-2125   °[facility] [address],  °[place], [place] 1-800-853-5163 or 336-641-4981   °Substance Abuse Resources °Organization         Address  Phone  Notes  °[facility]  336-882-2125   °[facility]  336-784-9470   °[facility]  336-285-9073   °[facility]  336-845-3988   °Residential &  Outpatient Substance Abuse Program  1-800-659-3381   °Psychological Services °Organization         Address  Phone  Notes  °[facility]  336- 832-9600   °  [facility]  336- 378-7881   °[facility] [address], [place] 1-800-853-5163 or 336-641-4981   ° °Mobile Crisis Teams °Organization         Address  Phone  Notes  °[facility], Mobile Crisis Care Unit  1-877-626-1772   °Assertive °[facility] ° [address] [place], [place] 336-834-9664   °Sharon DeEsch [address] °[place] 336-554-5454   ° °Self-Help/Support Groups °Organization         Address  Phone             Notes  °[facility] - variety of support groups  336- 373-1402 Call for more information  °Narcotics Anonymous (NA), [facility] [address], °[place]  2 meetings at this location  ° °Residential Treatment Programs °Organization         Address  Phone  Notes  °[facility] [address],    °[place]  1-866-801-8205   °[facility] ° [address], [place], [place] 704-293-8524   °[facility] [address], [place] 336-845-3988 Admissions: 8am-3pm M-F  °[facility] [address]    °[place], [place] 336-841-1104   °[facility] [address], [place], [place] 336-379-7146   °[facility] [address]  °[place], [place] 336-285-9073   °[facility] - Intensive Outpatient [address], [place], [place] 336-852-3033   °[facility] [address]  °[place], [place] 1-877-615-2722 or 336-784-9470   °[facility] [address] [place], [place] 336-227-7417 Accepts Medicaid  °[facility] [address]  °[place] 1-800-659-3381 Substance Abuse/Addiction Treatment  ° °[place] Behavioral Health Resources °Organization          Address  Phone  Notes  °[facility]  (888) 581-9988   °Julie Brannon, PhD [address] [place], [place]   (336) 349-5553 or (336) 951-0000   °[facility]   [address] °[place], [place] (336) 349-4454   °[facility] [address], [place], [place] (336) 342-8316 Insurance/Medicaid/sponsorship through [facility]  °[facility] [address]                                    [place], [place] (336) 342-8316 Therapy/tele-psych/case  °[facility] [address]  ° [place], [place] (336) 349-2233    °Dr. Arfeen  (336) 349-4544   °[facility] [place]  [employer] [place] [facility] 1) [address],  °2) [address], [place] °3)  [address], [place] (336) 349-3220 °(336) 342-7768 ° °(336) 342-8140   °[place] [facility] (336) 342-1394 or (336) 342-3537 (After Hours)    ° ° ° °

## 2014-12-12 NOTE — ED Notes (Signed)
Pt c/o increased chest congestion and cough x 2 days, sharp, central chest pain x 1 episode and headache this morning.  Pain score 5/10.  Hx of anxiety and headache.  Pt reports that she was sitting at work when chest pain episode occurred.  Pt was evaluated by EMS and EKG showed Sinus rhythm.

## 2015-01-15 ENCOUNTER — Encounter (HOSPITAL_COMMUNITY): Payer: Self-pay | Admitting: *Deleted

## 2015-01-15 ENCOUNTER — Emergency Department (HOSPITAL_COMMUNITY): Payer: Medicaid Other

## 2015-01-15 ENCOUNTER — Emergency Department (HOSPITAL_COMMUNITY)
Admission: EM | Admit: 2015-01-15 | Discharge: 2015-01-15 | Disposition: A | Discharge status: Home / Self Care | Payer: Medicaid Other | Attending: Emergency Medicine | Admitting: Emergency Medicine

## 2015-01-15 DIAGNOSIS — R197 Diarrhea, unspecified: Secondary | ICD-10-CM | POA: Diagnosis not present

## 2015-01-15 DIAGNOSIS — O99211 Obesity complicating pregnancy, first trimester: Secondary | ICD-10-CM | POA: Diagnosis not present

## 2015-01-15 DIAGNOSIS — Z3A01 Less than 8 weeks gestation of pregnancy: Secondary | ICD-10-CM | POA: Diagnosis not present

## 2015-01-15 DIAGNOSIS — R600 Localized edema: Secondary | ICD-10-CM | POA: Insufficient documentation

## 2015-01-15 DIAGNOSIS — O9989 Other specified diseases and conditions complicating pregnancy, childbirth and the puerperium: Secondary | ICD-10-CM | POA: Diagnosis not present

## 2015-01-15 DIAGNOSIS — M791 Myalgia: Secondary | ICD-10-CM | POA: Insufficient documentation

## 2015-01-15 DIAGNOSIS — R5383 Other fatigue: Secondary | ICD-10-CM | POA: Insufficient documentation

## 2015-01-15 DIAGNOSIS — Z862 Personal history of diseases of the blood and blood-forming organs and certain disorders involving the immune mechanism: Secondary | ICD-10-CM | POA: Diagnosis not present

## 2015-01-15 DIAGNOSIS — Z8659 Personal history of other mental and behavioral disorders: Secondary | ICD-10-CM | POA: Insufficient documentation

## 2015-01-15 DIAGNOSIS — Z87891 Personal history of nicotine dependence: Secondary | ICD-10-CM | POA: Diagnosis not present

## 2015-01-15 DIAGNOSIS — E669 Obesity, unspecified: Secondary | ICD-10-CM | POA: Diagnosis not present

## 2015-01-15 DIAGNOSIS — Z3490 Encounter for supervision of normal pregnancy, unspecified, unspecified trimester: Secondary | ICD-10-CM

## 2015-01-15 DIAGNOSIS — R609 Edema, unspecified: Secondary | ICD-10-CM

## 2015-01-15 LAB — COMPREHENSIVE METABOLIC PANEL
ALBUMIN: 3.6 g/dL (ref 3.5–5.2)
ALT: 23 U/L (ref 0–35)
ANION GAP: 5 (ref 5–15)
AST: 20 U/L (ref 0–37)
Alkaline Phosphatase: 34 U/L — ABNORMAL LOW (ref 39–117)
BILIRUBIN TOTAL: 0.3 mg/dL (ref 0.3–1.2)
BUN: 8 mg/dL (ref 6–23)
CALCIUM: 8.7 mg/dL (ref 8.4–10.5)
CHLORIDE: 106 mmol/L (ref 96–112)
CO2: 24 mmol/L (ref 19–32)
CREATININE: 0.71 mg/dL (ref 0.50–1.10)
GFR calc Af Amer: >90 mL/min (ref >90)
GLUCOSE: 84 mg/dL (ref 70–99)
Potassium: 3.8 mmol/L (ref 3.5–5.1)
Sodium: 135 mmol/L (ref 135–145)
Total Protein: 7.5 g/dL (ref 6.0–8.3)

## 2015-01-15 LAB — CBC WITH DIFFERENTIAL/PLATELET
BASOS PCT: 0 % (ref 0–1)
Basophils Absolute: 0 10*3/uL (ref 0.0–0.1)
Eosinophils Absolute: 0.3 10*3/uL (ref 0.0–0.7)
Eosinophils Relative: 5 % (ref 0–5)
HCT: 36.9 % (ref 36.0–46.0)
HEMOGLOBIN: 11.6 g/dL — AB (ref 12.0–15.0)
Lymphocytes Relative: 33 % (ref 12–46)
Lymphs Abs: 2.3 10*3/uL (ref 0.7–4.0)
MCH: 26.4 pg (ref 26.0–34.0)
MCHC: 31.4 g/dL (ref 30.0–36.0)
MCV: 83.9 fL (ref 78.0–100.0)
MONOS PCT: 6 % (ref 3–12)
Monocytes Absolute: 0.4 10*3/uL (ref 0.1–1.0)
Neutro Abs: 3.9 10*3/uL (ref 1.7–7.7)
Neutrophils Relative %: 56 % (ref 43–77)
Platelets: 239 10*3/uL (ref 150–400)
RBC: 4.4 MIL/uL (ref 3.87–5.11)
RDW: 16 % — ABNORMAL HIGH (ref 11.5–15.5)
WBC: 7 10*3/uL (ref 4.0–10.5)

## 2015-01-15 LAB — URINALYSIS, ROUTINE W REFLEX MICROSCOPIC
Bilirubin Urine: NEGATIVE
GLUCOSE, UA: NEGATIVE mg/dL
HGB URINE DIPSTICK: NEGATIVE
KETONES UR: 40 mg/dL — AB
NITRITE: NEGATIVE
PH: 5.5 (ref 5.0–8.0)
Protein, ur: NEGATIVE mg/dL
SPECIFIC GRAVITY, URINE: 1.025 (ref 1.005–1.030)
Urobilinogen, UA: 0.2 mg/dL (ref 0.0–1.0)

## 2015-01-15 LAB — URINE MICROSCOPIC-ADD ON

## 2015-01-15 LAB — PREGNANCY, URINE: Preg Test, Ur: POSITIVE — AB

## 2015-01-15 MED ORDER — PRENATAL VITAMIN 27-0.8 MG PO TABS: 1.0000 | ORAL_TABLET | Freq: Every day | ORAL | Status: DC

## 2015-01-15 NOTE — ED Notes (Signed)
MD at bedside. 

## 2015-01-15 NOTE — ED Provider Notes (Signed)
CSN: [id number]     Arrival date & time 01/15/15  1052 History   First MD Initiated Contact with Patient 01/15/15 1327     Chief Complaint  Patient presents with  . Leg Swelling    Patient is a 29 y.o. female presenting with general illness. The history is provided by the patient.  Illness Location:  Both legs Quality:  "swelling" Severity:  Moderate Onset quality:  Gradual Duration:  2 weeks Timing:  Constant Progression:  Worsening Chronicity:  New Relieved by:  Nothing Worsened by:  Nothing Associated symptoms: cough, diarrhea, fatigue and myalgias   Associated symptoms: no abdominal pain, no chest pain, no fever and no shortness of breath   Risk factors:  Denies h/o DVT/PE she reports 30lb wt gain over past 1-2 months LMP march 2016  Past Medical History  Diagnosis Date  . Headache(784.0)   . Anxiety   . Anemia 2009    s/p delivery  . Obesity   . Abortion    Past Surgical History  Procedure Laterality Date  . No past surgeries    . Dilation and curettage of uterus     Family History  Problem Relation Age of Onset  . Hypertension Mother   . Hypertension Paternal Aunt   . Cancer Maternal Grandfather    History  Substance Use Topics  . Smoking status: Former Smoker -- 0.50 packs/day    Quit date: 06/08/2013  . Smokeless tobacco: Never Used  . Alcohol Use: Yes     Comment: occasional   OB History    Gravida Para Term Preterm AB TAB SAB Ectopic Multiple Living   Review of Systems  Constitutional: Positive for fatigue and unexpected weight change. Negative for fever.  Respiratory: Positive for cough. Negative for shortness of breath.   Cardiovascular: Negative for chest pain.  Gastrointestinal: Positive for diarrhea. Negative for abdominal pain.  Genitourinary: Negative for vaginal bleeding.  Musculoskeletal: Positive for myalgias.  All other systems reviewed and are negative.     Allergies  Other  Home Medications   Prior to  Admission medications   Medication Sig Start Date End Date Taking? Authorizing Provider  Prenatal Vit-Fe Fumarate-FA (PRENATAL VITAMIN) 27-0.8 MG TABS Take 1 tablet by mouth daily. 01/15/15   Zadie Rhine, MD   BP 141/68 mmHg  Pulse 93  Temp(Src) 98 F (36.7 C)  Resp 18  Wt 316 lb 3.2 oz (143.427 kg)  SpO2 100%  LMP 12/15/2014 (Approximate) Physical Exam CONSTITUTIONAL: Well developed/well nourished HEAD: Normocephalic/atraumatic EYES: EOMI/PERRL ENMT: Mucous membranes moist NECK: supple no meningeal signs SPINE/BACK:entire spine nontender CV: S1/S2 noted, no murmurs/rubs/gallops noted LUNGS: Lungs are clear to auscultation bilaterally, no apparent distress ABDOMEN: soft, nontender, no rebound or guarding, bowel sounds noted throughout abdomen she is obese GU:no cva tenderness NEURO: Pt is awake/alert/appropriate, moves all extremitiesx4.  No facial droop.   EXTREMITIES: pulses normal/equal, full ROM, symmetric edema to bilateral LE without calf tenderness or erythema SKIN: warm, color normal PSYCH: no abnormalities of mood noted, alert and oriented to situation  ED Course  Procedures  Labs Review Labs Reviewed  COMPREHENSIVE METABOLIC PANEL - Abnormal; Notable for the following:    Alkaline Phosphatase 34 (*)    All other components within normal limits  CBC WITH DIFFERENTIAL/PLATELET - Abnormal; Notable for the following:    Hemoglobin 11.6 (*)    RDW 16.0 (*)    All other components within  normal limits  URINALYSIS, ROUTINE W REFLEX MICROSCOPIC - Abnormal; Notable for the following:    APPearance CLOUDY (*)    Ketones, ur 40 (*)    Leukocytes, UA TRACE (*)    All other components within normal limits  PREGNANCY, URINE - Abnormal; Notable for the following:    Preg Test, Ur POSITIVE (*)    All other components within normal limits  URINE MICROSCOPIC-ADD ON - Abnormal; Notable for the following:    Squamous Epithelial / LPF MANY (*)    Bacteria, UA FEW (*)    All  other components within normal limits   3:28 PM Pt found to be pregnant She denies abd pain/vag bleeding She is no distress suspect fatigue/LE edema due to pregnancy Doubt DVT/CHF She is well appearing, talking on phone Given OB referral Told to stop smoking Prenatal vitamins prescribed BP 141/68 mmHg  Pulse 93  Temp(Src) 98 F (36.7 C)  Resp 18  Wt 316 lb 3.2 oz (143.427 kg)  SpO2 100%  LMP 12/15/2014 (Approximate)   MDM   Final diagnoses:  Pregnancy  Peripheral edema    Nursing notes including past medical history and social history reviewed and considered in documentation Labs/vital reviewed myself and considered during evaluation     Zadie Rhineonald Gunter Conde, MD 01/15/15 1529

## 2015-01-15 NOTE — ED Notes (Signed)
Pt c/o swelling from hips down; legs/feet swollen; started last week; no previous history; painful with stabbing pains off and on over both legs

## 2015-01-17 ENCOUNTER — Encounter (HOSPITAL_COMMUNITY): Payer: Self-pay | Admitting: *Deleted

## 2015-01-17 ENCOUNTER — Inpatient Hospital Stay (HOSPITAL_COMMUNITY)
Admission: AD | Admit: 2015-01-17 | Discharge: 2015-01-17 | Disposition: A | Discharge status: Home / Self Care | Payer: Medicaid Other | Source: Ambulatory Visit | Attending: Family Medicine | Admitting: Family Medicine

## 2015-01-17 NOTE — MAU Note (Signed)
Pt presents to MAU for pregnancy verification. Denies any pain or bleeding

## 2015-01-30 ENCOUNTER — Inpatient Hospital Stay (HOSPITAL_COMMUNITY): Payer: Medicaid Other

## 2015-01-30 ENCOUNTER — Encounter (HOSPITAL_COMMUNITY): Payer: Self-pay | Admitting: *Deleted

## 2015-01-30 ENCOUNTER — Inpatient Hospital Stay (HOSPITAL_COMMUNITY)
Admission: AD | Admit: 2015-01-30 | Discharge: 2015-01-30 | Disposition: A | Discharge status: Home / Self Care | Payer: Medicaid Other | Source: Ambulatory Visit | Attending: Obstetrics & Gynecology | Admitting: Obstetrics & Gynecology

## 2015-01-30 DIAGNOSIS — R109 Unspecified abdominal pain: Secondary | ICD-10-CM | POA: Insufficient documentation

## 2015-01-30 DIAGNOSIS — O99611 Diseases of the digestive system complicating pregnancy, first trimester: Secondary | ICD-10-CM | POA: Insufficient documentation

## 2015-01-30 DIAGNOSIS — O26891 Other specified pregnancy related conditions, first trimester: Secondary | ICD-10-CM

## 2015-01-30 DIAGNOSIS — Z3A10 10 weeks gestation of pregnancy: Secondary | ICD-10-CM | POA: Insufficient documentation

## 2015-01-30 DIAGNOSIS — K219 Gastro-esophageal reflux disease without esophagitis: Secondary | ICD-10-CM

## 2015-01-30 DIAGNOSIS — R102 Pelvic and perineal pain: Secondary | ICD-10-CM | POA: Insufficient documentation

## 2015-01-30 DIAGNOSIS — Z87891 Personal history of nicotine dependence: Secondary | ICD-10-CM | POA: Insufficient documentation

## 2015-01-30 LAB — URINALYSIS, ROUTINE W REFLEX MICROSCOPIC
Bilirubin Urine: NEGATIVE
Glucose, UA: NEGATIVE mg/dL
Hgb urine dipstick: NEGATIVE
Ketones, ur: 40 mg/dL — AB
LEUKOCYTES UA: NEGATIVE
NITRITE: NEGATIVE
Protein, ur: NEGATIVE mg/dL
SPECIFIC GRAVITY, URINE: 1.025 (ref 1.005–1.030)
UROBILINOGEN UA: 0.2 mg/dL (ref 0.0–1.0)
pH: 5.5 (ref 5.0–8.0)

## 2015-01-30 LAB — CBC
HEMATOCRIT: 35.1 % — AB (ref 36.0–46.0)
HEMOGLOBIN: 11.5 g/dL — AB (ref 12.0–15.0)
MCH: 26.8 pg (ref 26.0–34.0)
MCHC: 32.8 g/dL (ref 30.0–36.0)
MCV: 81.8 fL (ref 78.0–100.0)
Platelets: 206 10*3/uL (ref 150–400)
RBC: 4.29 MIL/uL (ref 3.87–5.11)
RDW: 15.6 % — AB (ref 11.5–15.5)
WBC: 6.3 10*3/uL (ref 4.0–10.5)

## 2015-01-30 LAB — HCG, QUANTITATIVE, PREGNANCY: hCG, Beta Chain, Quant, S: 56387 m[IU]/mL — ABNORMAL HIGH (ref <5)

## 2015-01-30 MED ORDER — FAMOTIDINE 40 MG PO TABS: 40.0000 mg | ORAL_TABLET | Freq: Every day | ORAL | Status: DC

## 2015-01-30 MED ORDER — GI COCKTAIL ~~LOC~~: 30.0000 mL | Freq: Once | ORAL | Status: DC

## 2015-01-30 NOTE — Discharge Instructions (Signed)
First Trimester of Pregnancy The first trimester of pregnancy is from week 1 until the end of week 12 (months 1 through 3). A week after a sperm fertilizes an egg, the egg will implant on the wall of the uterus. This embryo will begin to develop into a baby. Genes from you and your partner are forming the baby. The female genes determine whether the baby is a boy or a girl. At 6-8 weeks, the eyes and face are formed, and the heartbeat can be seen on ultrasound. At the end of 12 weeks, all the baby's organs are formed.  Now that you are pregnant, you will want to do everything you can to have a healthy baby. Two of the most important things are to get good prenatal care and to follow your health care provider's instructions. Prenatal care is all the medical care you receive before the baby's birth. This care will help prevent, find, and treat any problems during the pregnancy and childbirth. BODY CHANGES Your body goes through many changes during pregnancy. The changes vary from woman to woman.   You may gain or lose a couple of pounds at first.  You may feel sick to your stomach (nauseous) and throw up (vomit). If the vomiting is uncontrollable, call your health care provider.  You may tire easily.  You may develop headaches that can be relieved by medicines approved by your health care provider.  You may urinate more often. Painful urination may mean you have a bladder infection.  You may develop heartburn as a result of your pregnancy.  You may develop constipation because certain hormones are causing the muscles that push waste through your intestines to slow down.  You may develop hemorrhoids or swollen, bulging veins (varicose veins).  Your breasts may begin to grow larger and become tender. Your nipples may stick out more, and the tissue that surrounds them (areola) may become darker.  Your gums may bleed and may be sensitive to brushing and flossing.  Dark spots or blotches (chloasma,  mask of pregnancy) may develop on your face. This will likely fade after the baby is born.  Your menstrual periods will stop.  You may have a loss of appetite.  You may develop cravings for certain kinds of food.  You may have changes in your emotions from day to day, such as being excited to be pregnant or being concerned that something may go wrong with the pregnancy and baby.  You may have more vivid and strange dreams.  You may have changes in your hair. These can include thickening of your hair, rapid growth, and changes in texture. Some women also have hair loss during or after pregnancy, or hair that feels dry or thin. Your hair will most likely return to normal after your baby is born. WHAT TO EXPECT AT YOUR PRENATAL VISITS During a routine prenatal visit:  You will be weighed to make sure you and the baby are growing normally.  Your blood pressure will be taken.  Your abdomen will be measured to track your baby's growth.  The fetal heartbeat will be listened to starting around week 10 or 12 of your pregnancy.  Test results from any previous visits will be discussed. Your health care provider may ask you:  How you are feeling.  If you are feeling the baby move.  If you have had any abnormal symptoms, such as leaking fluid, bleeding, severe headaches, or abdominal cramping.  If you have any questions. Other tests   that may be performed during your first trimester include:  Blood tests to find your blood type and to check for the presence of any previous infections. They will also be used to check for low iron levels (anemia) and Rh antibodies. Later in the pregnancy, blood tests for diabetes will be done along with other tests if problems develop.  Urine tests to check for infections, diabetes, or protein in the urine.  An ultrasound to confirm the proper growth and development of the baby.  An amniocentesis to check for possible genetic problems.  Fetal screens for  spina bifida and Down syndrome.  You may need other tests to make sure you and the baby are doing well. HOME CARE INSTRUCTIONS  Medicines  Follow your health care provider's instructions regarding medicine use. Specific medicines may be either safe or unsafe to take during pregnancy.  Take your prenatal vitamins as directed.  If you develop constipation, try taking a stool softener if your health care provider approves. Diet  Eat regular, well-balanced meals. Choose a variety of foods, such as meat or vegetable-based protein, fish, milk and low-fat dairy products, vegetables, fruits, and whole grain breads and cereals. Your health care provider will help you determine the amount of weight gain that is right for you.  Avoid raw meat and uncooked cheese. These carry germs that can cause birth defects in the baby.  Eating four or five small meals rather than three large meals a day may help relieve nausea and vomiting. If you start to feel nauseous, eating a few soda crackers can be helpful. Drinking liquids between meals instead of during meals also seems to help nausea and vomiting.  If you develop constipation, eat more high-fiber foods, such as fresh vegetables or fruit and whole grains. Drink enough fluids to keep your urine clear or pale yellow. Activity and Exercise  Exercise only as directed by your health care provider. Exercising will help you:  Control your weight.  Stay in shape.  Be prepared for labor and delivery.  Experiencing pain or cramping in the lower abdomen or low back is a good sign that you should stop exercising. Check with your health care provider before continuing normal exercises.  Try to avoid standing for long periods of time. Move your legs often if you must stand in one place for a long time.  Avoid heavy lifting.  Wear low-heeled shoes, and practice good posture.  You may continue to have sex unless your health care provider directs you  otherwise. Relief of Pain or Discomfort  Wear a good support bra for breast tenderness.   Take warm sitz baths to soothe any pain or discomfort caused by hemorrhoids. Use hemorrhoid cream if your health care provider approves.   Rest with your legs elevated if you have leg cramps or low back pain.  If you develop varicose veins in your legs, wear support hose. Elevate your feet for 15 minutes, 3-4 times a day. Limit salt in your diet. Prenatal Care  Schedule your prenatal visits by the twelfth week of pregnancy. They are usually scheduled monthly at first, then more often in the last 2 months before delivery.  Write down your questions. Take them to your prenatal visits.  Keep all your prenatal visits as directed by your health care provider. Safety  Wear your seat belt at all times when driving.  Make a list of emergency phone numbers, including numbers for family, friends, the hospital, and police and fire departments. General Tips    Ask your health care provider for a referral to a local prenatal education class. Begin classes no later than at the beginning of month 6 of your pregnancy.  Ask for help if you have counseling or nutritional needs during pregnancy. Your health care provider can offer advice or refer you to specialists for help with various needs.  Do not use hot tubs, steam rooms, or saunas.  Do not douche or use tampons or scented sanitary pads.  Do not cross your legs for long periods of time.  Avoid cat litter boxes and soil used by cats. These carry germs that can cause birth defects in the baby and possibly loss of the fetus by miscarriage or stillbirth.  Avoid all smoking, herbs, alcohol, and medicines not prescribed by your health care provider. Chemicals in these affect the formation and growth of the baby.  Schedule a dentist appointment. At home, brush your teeth with a soft toothbrush and be gentle when you floss. SEEK MEDICAL CARE IF:   You have  dizziness.  You have mild pelvic cramps, pelvic pressure, or nagging pain in the abdominal area.  You have persistent nausea, vomiting, or diarrhea.  You have a bad smelling vaginal discharge.  You have pain with urination.  You notice increased swelling in your face, hands, legs, or ankles. SEEK IMMEDIATE MEDICAL CARE IF:   You have a fever.  You are leaking fluid from your vagina.  You have spotting or bleeding from your vagina.  You have severe abdominal cramping or pain.  You have rapid weight gain or loss.  You vomit blood or material that looks like coffee grounds.  You are exposed to German measles and have never had them.  You are exposed to fifth disease or chickenpox.  You develop a severe headache.  You have shortness of breath.  You have any kind of trauma, such as from a fall or a car accident. Document Released: 09/07/2001 Document Revised: 01/28/2014 Document Reviewed: 07/24/2013 [employer] Patient Information 2015 [employer]. This information is not intended to replace advice given to you by your health care provider. Make sure you discuss any questions you have with your health care provider.  

## 2015-01-30 NOTE — MAU Note (Addendum)
Pt report having abd burninng pain and cramping across her stomach since yesterday

## 2015-01-30 NOTE — MAU Provider Note (Signed)
History     CSN: [id number]  Arrival date and time: 01/30/15 1739   First Provider Initiated Contact with Patient 01/30/15 2215      Chief Complaint  Patient presents with  . Abdominal Pain   Abdominal Pain This is a new problem. The current episode started yesterday. The onset quality is gradual. The problem occurs intermittently. The problem has been unchanged. The pain is located in the periumbilical region and suprapubic region. The pain is at a severity of 10/10. The quality of the pain is burning and cramping. The abdominal pain radiates to the back. Pertinent negatives include no constipation, diarrhea, dysuria, fever, frequency, nausea or vomiting. Nothing aggravates the pain. The pain is relieved by nothing. She has tried nothing for the symptoms.    Past Medical History  Diagnosis Date  . Headache(784.0)   . Anxiety   . Anemia 2009    s/p delivery  . Obesity   . Abortion     Past Surgical History  Procedure Laterality Date  . No past surgeries    . Dilation and curettage of uterus      Family History  Problem Relation Age of Onset  . Hypertension Mother   . Hypertension Paternal Aunt   . Cancer Maternal Grandfather     History  Substance Use Topics  . Smoking status: Former Smoker -- 0.50 packs/day    Quit date: 06/08/2013  . Smokeless tobacco: Never Used  . Alcohol Use: Yes     Comment: occasional    Allergies:  Allergies  Allergen Reactions  . Other     Has an allergy to some type of eye drop but does not remember the name (in childhood)    Prescriptions prior to admission  Medication Sig Dispense Refill Last Dose  . Prenatal Vit-Fe Fumarate-FA (PRENATAL VITAMIN) 27-0.8 MG TABS Take 1 tablet by mouth daily. (Patient not taking: Reported on 01/30/2015) 30 tablet 0     Review of Systems  Constitutional: Negative for fever.  Gastrointestinal: Positive for abdominal pain. Negative for nausea, vomiting, diarrhea and constipation.  Genitourinary:  Negative for dysuria, urgency and frequency.   Physical Exam   Blood pressure 125/62, pulse 91, temperature 98.4 F (36.9 C), resp. rate 18, height  (1.753 m), weight 141.341 kg (311 lb 9.6 oz), last menstrual period 12/15/2014, not currently breastfeeding.  Physical Exam  Nursing note and vitals reviewed. Constitutional: She is oriented to person, place, and time. She appears well-developed and well-nourished. No distress.  Cardiovascular: Normal rate.   Respiratory: Effort normal.  GI: Soft. There is no tenderness. There is no rebound.  Neurological: She is alert and oriented to person, place, and time.  Skin: Skin is warm and dry.  Psychiatric: She has a normal mood and affect.     Results for orders placed or performed during the hospital encounter of 01/30/15 (from the past 24 hour(s))  Urinalysis, Routine w reflex microscopic     Status: Abnormal   Collection Time: 01/30/15  7:05 PM  Result Value Ref Range   Color, Urine YELLOW YELLOW   APPearance CLEAR CLEAR   Specific Gravity, Urine 1.025 1.005 - 1.030   pH 5.5 5.0 - 8.0   Glucose, UA NEGATIVE NEGATIVE mg/dL   Hgb urine dipstick NEGATIVE NEGATIVE   Bilirubin Urine NEGATIVE NEGATIVE   Ketones, ur 40 (A) NEGATIVE mg/dL   Protein, ur NEGATIVE NEGATIVE mg/dL   Urobilinogen, UA 0.2 0.0 - 1.0 mg/dL   Nitrite NEGATIVE NEGATIVE  Leukocytes, UA NEGATIVE NEGATIVE  CBC     Status: Abnormal   Collection Time: 01/30/15  8:20 PM  Result Value Ref Range   WBC 6.3 4.0 - 10.5 K/uL   RBC 4.29 3.87 - 5.11 MIL/uL   Hemoglobin 11.5 (L) 12.0 - 15.0 g/dL   HCT 96.035.1 (L) 45.436.0 - 09.846.0 %   MCV 81.8 78.0 - 100.0 fL   MCH 26.8 26.0 - 34.0 pg   MCHC 32.8 30.0 - 36.0 g/dL   RDW 11.915.6 (H) 14.711.5 - 82.915.5 %   Platelets 206 150 - 400 K/uL  hCG, quantitative, pregnancy     Status: Abnormal   Collection Time: 01/30/15  8:20 PM  Result Value Ref Range   hCG, Beta Chain, Quant, S [id number] (H) <5 mIU/mL   [place] Ob Comp Less 14 Wks  01/30/2015    CLINICAL DATA:  Pelvic pain  EXAM: OBSTETRIC <14 WK US AND TRANSVAGINAL OB US  TECHNIQUE: Both transabdominal and transvaginal ultrasound examinations were performed for complete evaluation of the gestation as well as the maternal uterus, adnexal regions, and pelvic cul-de-sac. Transvaginal technique was performed to assess early pregnancy.  COMPARISON:  None.  FINDINGS: Intrauterine gestational sac: Visualized/normal in shape.  Yolk sac:  Present  Embryo:  Present  Cardiac Activity: Present  Heart Rate: 162  bpm  CRL:  17.4  mm   8 w   2 d                  US EDC: 09/09/2015  Maternal uterus/adnexae: Small subchorionic hemorrhage. Normal bilateral ovaries. Trace pelvic free fluid likely physiologic.  IMPRESSION: 1. Single live intrauterine pregnancy dating 8 weeks 2 days. 2. Small subchorionic hemorrhage. Attention on follow-up examination is recommended.   Electronically Signed   By: Elige KoHetal  Patel   On: 01/30/2015 21:30   [place] Ob Transvaginal  01/30/2015   CLINICAL DATA:  Pelvic pain  EXAM: OBSTETRIC <14 WK US AND TRANSVAGINAL OB US  TECHNIQUE: Both transabdominal and transvaginal ultrasound examinations were performed for complete evaluation of the gestation as well as the maternal uterus, adnexal regions, and pelvic cul-de-sac. Transvaginal technique was performed to assess early pregnancy.  COMPARISON:  None.  FINDINGS: Intrauterine gestational sac: Visualized/normal in shape.  Yolk sac:  Present  Embryo:  Present  Cardiac Activity: Present  Heart Rate: 162  bpm  CRL:  17.4  mm   8 w   2 d                  US EDC: 09/09/2015  Maternal uterus/adnexae: Small subchorionic hemorrhage. Normal bilateral ovaries. Trace pelvic free fluid likely physiologic.  IMPRESSION: 1. Single live intrauterine pregnancy dating 8 weeks 2 days. 2. Small subchorionic hemorrhage. Attention on follow-up examination is recommended.   Electronically Signed   By: Elige KoHetal  Patel   On: 01/30/2015 21:30   MAU Course  Procedures  MDM 2251:  Patient desires DC home. She does not want to have GI cocktail today.   Assessment and Plan   1. Gastroesophageal reflux disease without esophagitis   2. Pelvic pain affecting pregnancy in first trimester, antepartum    DC home Rx: pepcid First trimester precautions reviewed Return to MAU as needed  Follow-up Information    [facility] with [facility].   Specialty:  Obstetrics and Gynecology   Why:  As scheduled   Contact information:   [address] [place] [id number] [phone]  Tawnya CrookHogan, Heather Donovan 01/30/2015, 10:16 PM

## 2015-01-31 LAB — HIV ANTIBODY (ROUTINE TESTING W REFLEX): HIV Screen 4th Generation wRfx: NONREACTIVE

## 2015-02-07 LAB — OB RESULTS CONSOLE HGB/HCT, BLOOD: HEMATOCRIT: 36 %

## 2015-02-07 LAB — OB RESULTS CONSOLE PLATELET COUNT: Platelets: 249 10*3/uL

## 2015-02-07 LAB — OB RESULTS CONSOLE RPR: RPR: NONREACTIVE

## 2015-02-07 LAB — OB RESULTS CONSOLE GC/CHLAMYDIA
Chlamydia: NEGATIVE
GC PROBE AMP, GENITAL: NEGATIVE

## 2015-02-07 LAB — OB RESULTS CONSOLE ANTIBODY SCREEN: Antibody Screen: NEGATIVE

## 2015-02-07 LAB — OB RESULTS CONSOLE RUBELLA ANTIBODY, IGM: RUBELLA: IMMUNE

## 2015-02-07 LAB — OB RESULTS CONSOLE HIV ANTIBODY (ROUTINE TESTING): HIV: NONREACTIVE

## 2015-02-07 LAB — OB RESULTS CONSOLE HEPATITIS B SURFACE ANTIGEN: HEP B S AG: NEGATIVE

## 2015-03-17 ENCOUNTER — Inpatient Hospital Stay (HOSPITAL_COMMUNITY)
Admission: AD | Admit: 2015-03-17 | Discharge: 2015-03-17 | Disposition: A | Discharge status: Home / Self Care | Payer: Medicaid Other | Source: Ambulatory Visit | Attending: Obstetrics & Gynecology | Admitting: Obstetrics & Gynecology

## 2015-03-17 ENCOUNTER — Encounter (HOSPITAL_COMMUNITY): Payer: Self-pay | Admitting: *Deleted

## 2015-03-17 DIAGNOSIS — Z3A14 14 weeks gestation of pregnancy: Secondary | ICD-10-CM | POA: Insufficient documentation

## 2015-03-17 DIAGNOSIS — O9989 Other specified diseases and conditions complicating pregnancy, childbirth and the puerperium: Secondary | ICD-10-CM | POA: Diagnosis not present

## 2015-03-17 DIAGNOSIS — R42 Dizziness and giddiness: Secondary | ICD-10-CM | POA: Diagnosis not present

## 2015-03-17 LAB — CBC WITH DIFFERENTIAL/PLATELET
BASOS PCT: 0 % (ref 0–1)
Basophils Absolute: 0 10*3/uL (ref 0.0–0.1)
EOS ABS: 0.3 10*3/uL (ref 0.0–0.7)
Eosinophils Relative: 3 % (ref 0–5)
HCT: 32.2 % — ABNORMAL LOW (ref 36.0–46.0)
Hemoglobin: 10.6 g/dL — ABNORMAL LOW (ref 12.0–15.0)
Lymphocytes Relative: 25 % (ref 12–46)
Lymphs Abs: 2 10*3/uL (ref 0.7–4.0)
MCH: 26.4 pg (ref 26.0–34.0)
MCHC: 32.9 g/dL (ref 30.0–36.0)
MCV: 80.1 fL (ref 78.0–100.0)
Monocytes Absolute: 0.5 10*3/uL (ref 0.1–1.0)
Monocytes Relative: 6 % (ref 3–12)
NEUTROS ABS: 5.4 10*3/uL (ref 1.7–7.7)
Neutrophils Relative %: 66 % (ref 43–77)
PLATELETS: 203 10*3/uL (ref 150–400)
RBC: 4.02 MIL/uL (ref 3.87–5.11)
RDW: 15 % (ref 11.5–15.5)
WBC: 8.2 10*3/uL (ref 4.0–10.5)

## 2015-03-17 LAB — AMYLASE: Amylase: 48 U/L (ref 28–100)

## 2015-03-17 LAB — LIPASE, BLOOD: LIPASE: 16 U/L — AB (ref 22–51)

## 2015-03-17 MED ORDER — ONDANSETRON HCL 4 MG/2ML IJ SOLN
4.0000 mg | Freq: Four times a day (QID) | INTRAMUSCULAR | Status: DC
  Administered 2015-03-17: 4 mg via INTRAVENOUS
  Filled 2015-03-17: qty 2

## 2015-03-17 MED ORDER — PROMETHAZINE HCL 25 MG/ML IJ SOLN
25.0000 mg | Freq: Once | INTRAVENOUS | Status: AC
  Administered 2015-03-17: 25 mg via INTRAVENOUS
  Filled 2015-03-17: qty 1

## 2015-03-17 MED ORDER — LACTATED RINGERS IV BOLUS (SEPSIS)
1000.0000 mL | Freq: Once | INTRAVENOUS | Status: AC
Start: 2015-03-17 — End: 2015-03-17
  Administered 2015-03-17: 1000 mL via INTRAVENOUS

## 2015-03-17 NOTE — MAU Provider Note (Signed)
Assuming care -- pt reports feeling better after IVFs, Zofran and Phenergan. D/C'd home w/ strict late SAB precautions. OB f/u as scheduled. Work note provided.   Sherre Scarlet, CNM 03/17/15, 11:30 PM

## 2015-03-17 NOTE — Discharge Instructions (Signed)
Dehydration, Adult Dehydration means your body does not have as much fluid as it needs. Your kidneys, brain, and heart will not work properly without the right amount of fluids and salt.  HOME CARE  Ask your doctor how to replace body fluid losses (rehydrate).  Drink enough fluids to keep your pee (urine) clear or pale yellow.  Drink small amounts of fluids often if you feel sick to your stomach (nauseous) or throw up (vomit).  Eat like you normally do.  Avoid: Foods or drinks high in sugar.Hyperemesis Gravidarum Hyperemesis gravidarum is a severe form of nausea and vomiting that happens during pregnancy. Hyperemesis is worse than morning sickness. It may cause you to have nausea or vomiting all day for many days. It may keep you from eating and drinking enough food and liquids. Hyperemesis usually occurs during the first half (the first 20 weeks) of pregnancy. It often goes away once a woman is in her second half of pregnancy. However, sometimes hyperemesis continues through an entire pregnancy.  CAUSES  The cause of this condition is not completely known but is thought to be related to changes in the body's hormones when pregnant. It could be from the high level of the pregnancy hormone or an increase in estrogen in the body.  SIGNS AND SYMPTOMS  Severe nausea and vomiting. Nausea that does not go away. Vomiting that does not allow you to keep any food down. Weight loss and body fluid loss (dehydration). Having no desire to eat or not liking food you have previously enjoyed. DIAGNOSIS  Your health care provider will do a physical exam and ask you about your symptoms. He or she may also order blood tests and urine tests to make sure something else is not causing the problem.  TREATMENT  You may only need medicine to control the problem. If medicines do not control the nausea and vomiting, you will be treated in the hospital to prevent dehydration, increased acid in the blood (acidosis),  weight loss, and changes in the electrolytes in your body that may harm the unborn baby (fetus). You may need IV fluids.  HOME CARE INSTRUCTIONS  Only take over-the-counter or prescription medicines as directed by your health care provider. Try eating a couple of dry crackers or toast in the morning before getting out of bed. Avoid foods and smells that upset your stomach. Avoid fatty and spicy foods. Eat 5-6 small meals a day. Do not drink when eating meals. Drink between meals. For snacks, eat high-protein foods, such as cheese. Eat or suck on things that have ginger in them. Ginger helps nausea. Avoid food preparation. The smell of food can spoil your appetite. Avoid iron pills and iron in your multivitamins until after 3-4 months of being pregnant. However, consult with your health care provider before stopping any prescribed iron pills. SEEK MEDICAL CARE IF:  Your abdominal pain increases. You have a severe headache. You have vision problems. You are losing weight. SEEK IMMEDIATE MEDICAL CARE IF:  You are unable to keep fluids down. You vomit blood. You have constant nausea and vomiting. You have excessive weakness. You have extreme thirst. You have dizziness or fainting. You have a fever or persistent symptoms for more than 2-3 days. You have a fever and your symptoms suddenly get worse. MAKE SURE YOU:  Understand these instructions. Will watch your condition. Will get help right away if you are not doing well or get worse. Document Released: 09/13/2005 Document Revised: 07/04/2013 Document Reviewed: 04/25/2013 [employer]  Patient Information 2015 [place], [place]. This information is not intended to replace advice given to you by your health care provider. Make sure you discuss any questions you have with your health care provider.    Bubbly (carbonated) drinks.  Juice.  Very hot or cold fluids.  Drinks with caffeine.  Fatty, greasy  foods.  Alcohol.  Tobacco.  Eating too much.  Gelatin desserts.  Wash your hands to avoid spreading germs (bacteria, viruses).  Only take medicine as told by your doctor.  Keep all doctor visits as told. GET HELP RIGHT AWAY IF:   You cannot drink something without throwing up.  You get worse even with treatment.  Your vomit has blood in it or looks greenish.  Your poop (stool) has blood in it or looks black and tarry.  You have not peed in 6 to 8 hours.  You pee a small amount of very dark pee.  You have a fever.  You pass out (faint).  You have belly (abdominal) pain that gets worse or stays in one spot (localizes).  You have a rash, stiff neck, or bad headache.  You get easily annoyed, sleepy, or are hard to wake up.  You feel weak, dizzy, or very thirsty. MAKE SURE YOU:   Understand these instructions.  Will watch your condition.  Will get help right away if you are not doing well or get worse. Document Released: 07/10/2009 Document Revised: 12/06/2011 Document Reviewed: 05/03/2011 [facility] Patient Information 2015 [place], [place]. This information is not intended to replace advice given to you by your health care provider. Make sure you discuss any questions you have with your health care provider.

## 2015-03-17 NOTE — MAU Note (Signed)
Pt C/O dizziness since last week, blurred vision, L side pain, vomiting.  Was seen @ [facility] today, states CBG was low & she was dehydrated, sent to MAU.

## 2015-03-19 IMAGING — US US OB COMP LESS 14 WK
1 series · 14 of 16 positions shown · non-contrast
Comparison: None.

CLINICAL DATA: Six weeks pregnant by LMP. Estimated gestational age
by LMP is 6 weeks 5 days. Bleeding and pain.

EXAM:
OBSTETRIC <14 WK ULTRASOUND
TECHNIQUE: Transabdominal ultrasound was performed for evaluation of the
gestation as well as the maternal uterus and adnexal regions.

[Series 1: us ob comp less 14 wks · 16 acquisitions, 14 frames shown]
[im 1/16]
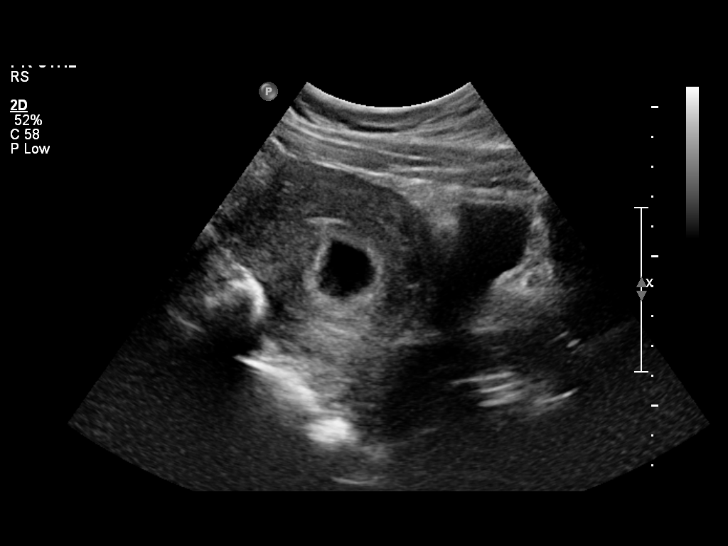
[im 2/16]
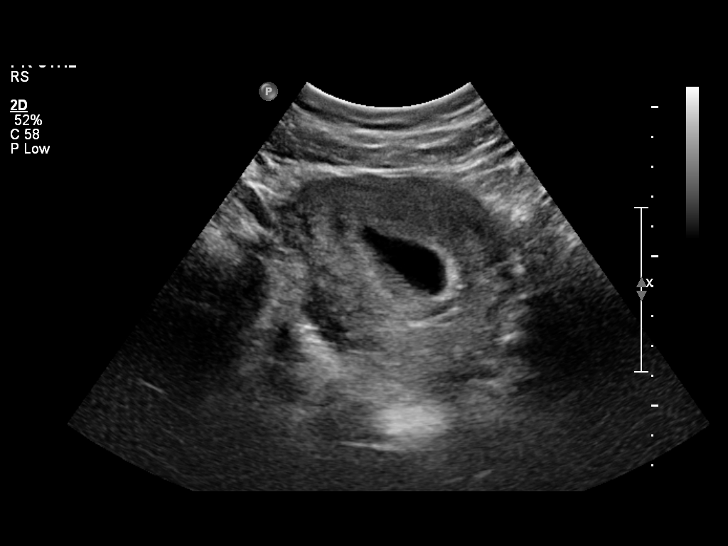
[im 3/16]
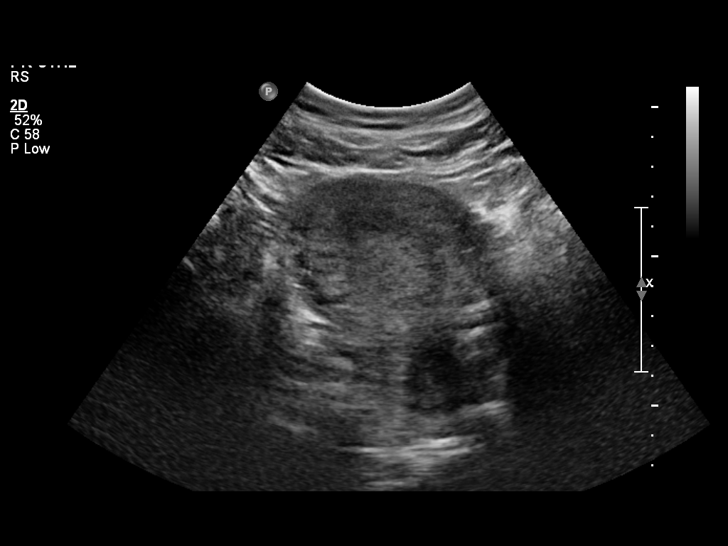
[im 5/16]
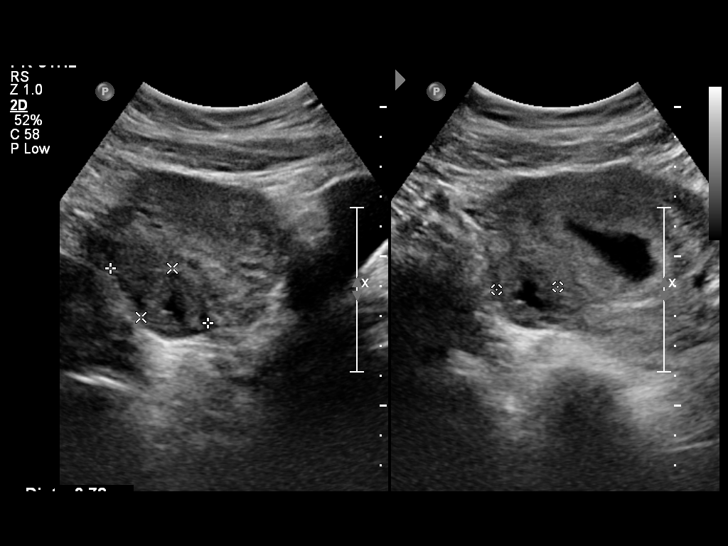
[im 6/16]
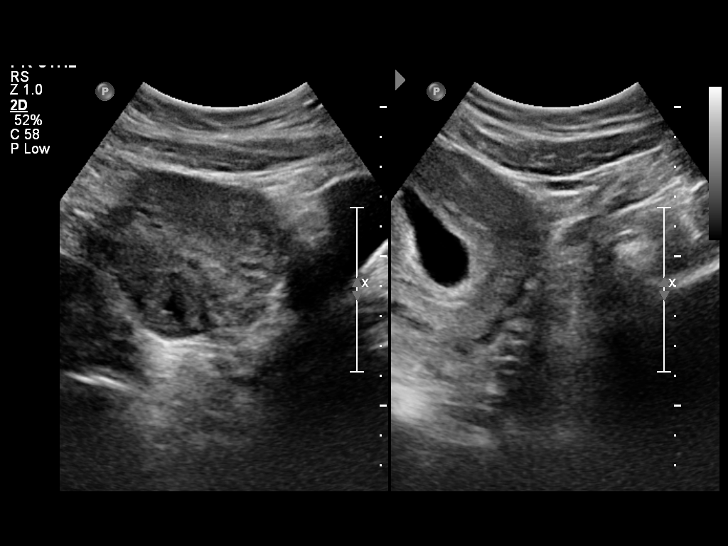
[im 7/16]
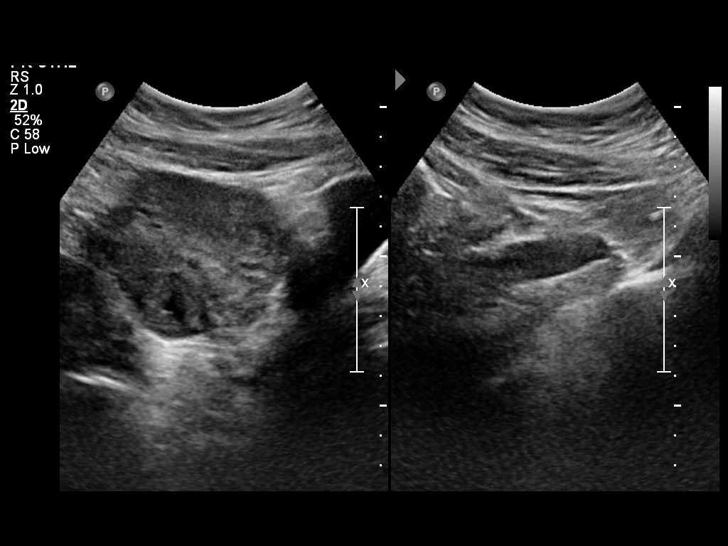
[im 8/16]
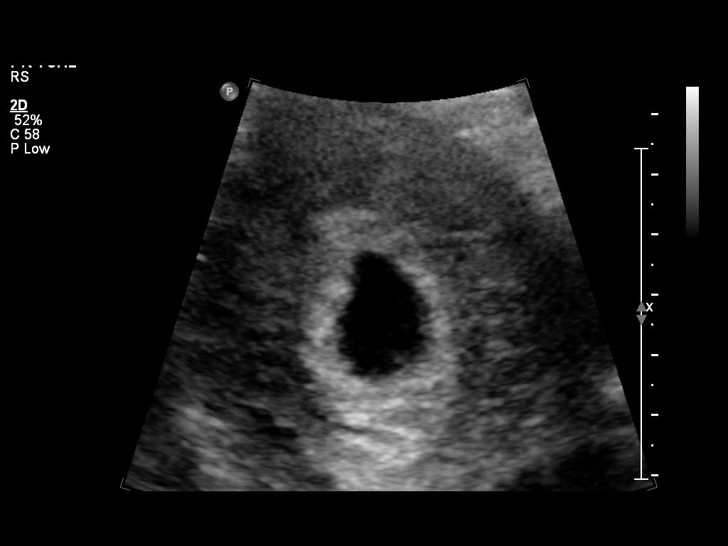
[im 9/16]
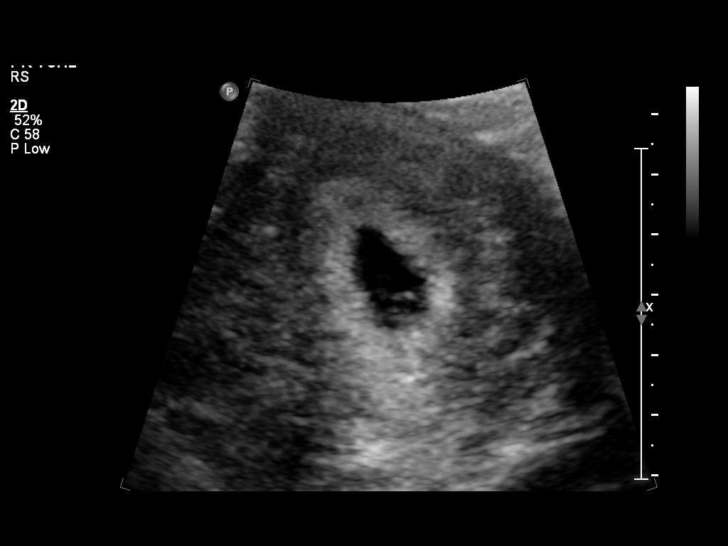
[im 10/16]
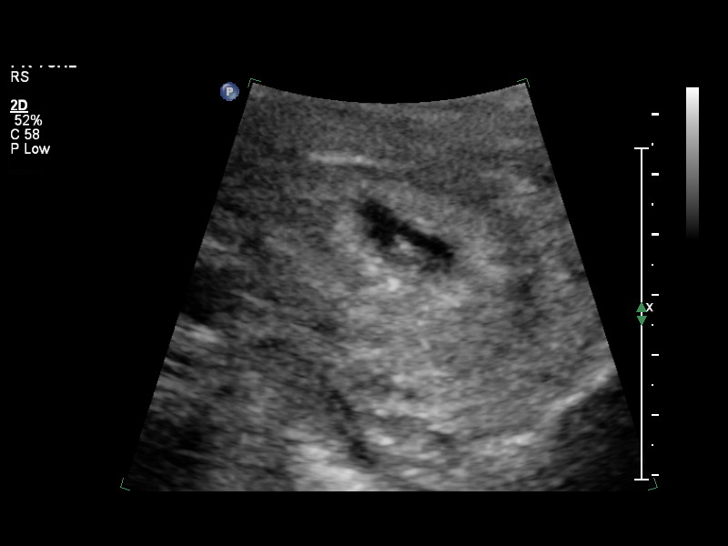
[im 11/16]
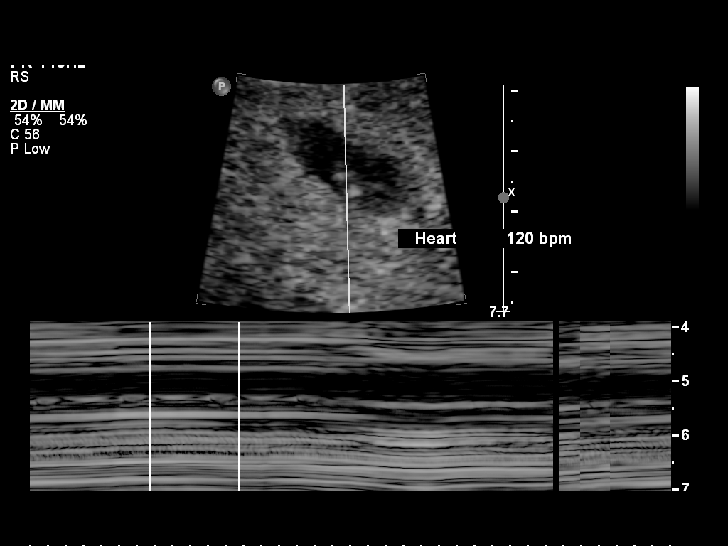
[im 13/16]
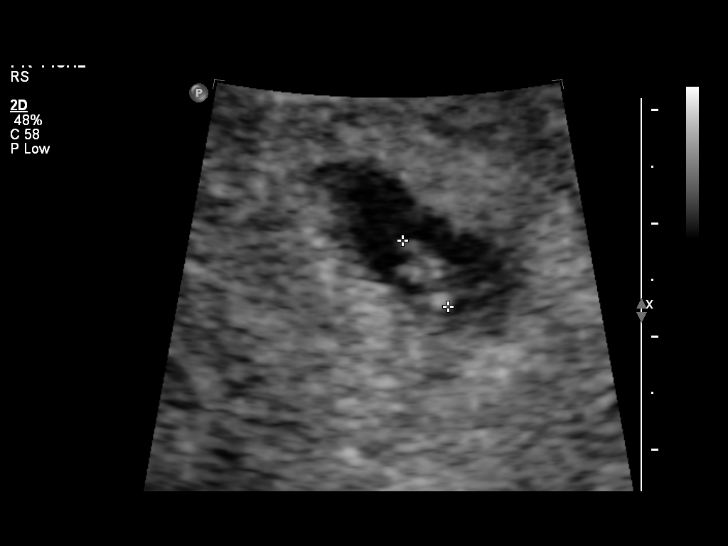
[im 14/16]
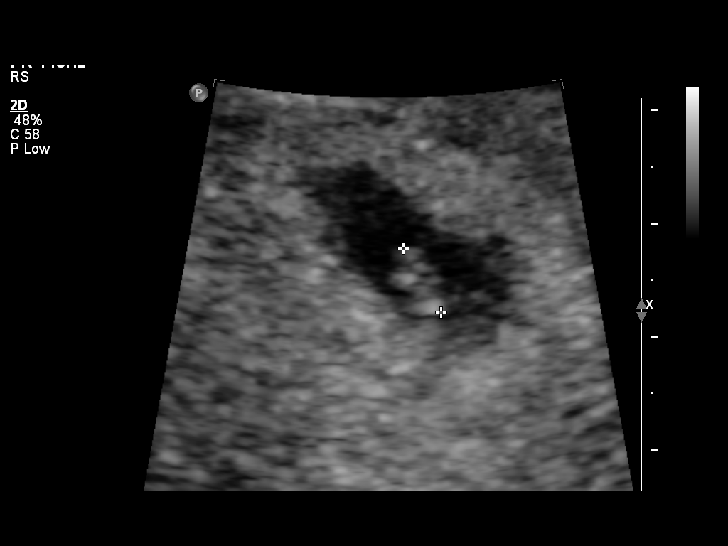
[im 15/16]
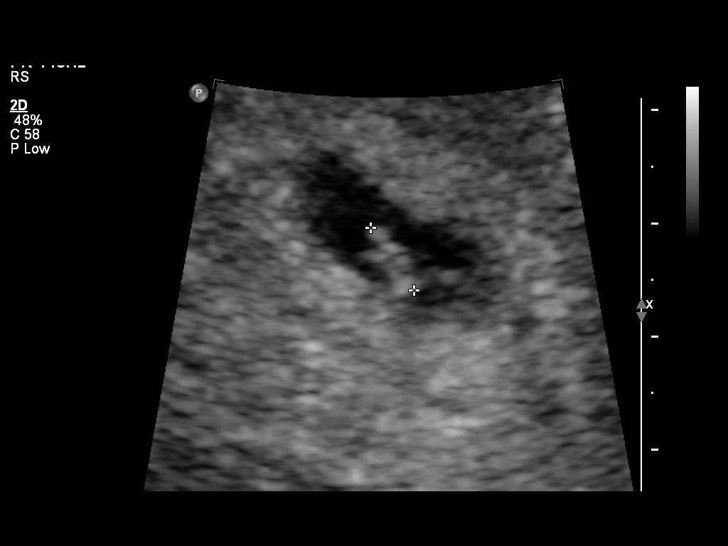
[im 16/16]
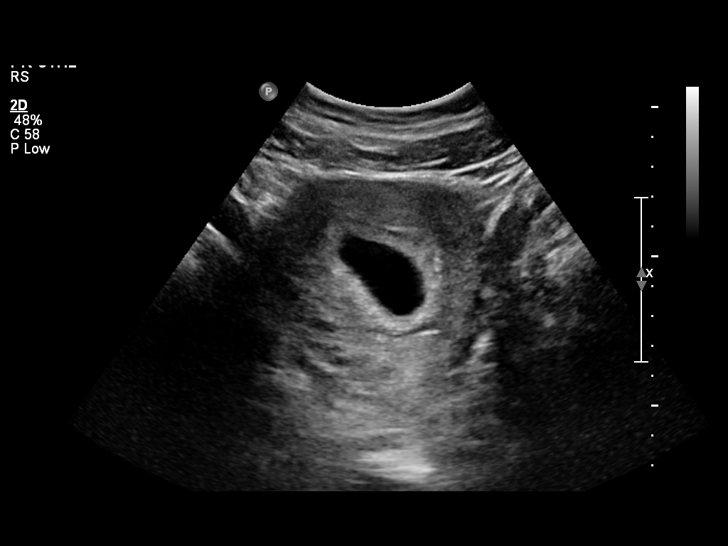

[14 of 16 positions shown; findings below may reference images not displayed]

FINDINGS: Intrauterine gestational sac: Visualized/normal in shape.

Yolk sac:  Visualized

Embryo:  Visualized

Cardiac Activity: Visualized

Heart Rate: 120 bpm

CRL:   6.8  mm   6 w 4d                  US EDC: 03/16/2014

Maternal uterus/adnexae: No subchorionic hemorrhage is visualized.
The right maternal ovary appears within normal limits and contains a
corpus luteum. Left ovary is not visualized. No left adnexal mass is
seen. No free pelvic fluid is seen.
IMPRESSION: Single living intrauterine pregnancy. Estimated gestational age by
ultrasound is concordant with dating back LMP.

age is recommended.

## 2015-03-28 ENCOUNTER — Encounter (HOSPITAL_COMMUNITY): Payer: Self-pay | Admitting: *Deleted

## 2015-03-28 ENCOUNTER — Inpatient Hospital Stay (HOSPITAL_COMMUNITY)
Admission: AD | Admit: 2015-03-28 | Discharge: 2015-03-28 | Disposition: A | Discharge status: Home / Self Care | Payer: Medicaid Other | Source: Ambulatory Visit | Attending: Obstetrics & Gynecology | Admitting: Obstetrics & Gynecology

## 2015-03-28 DIAGNOSIS — N760 Acute vaginitis: Secondary | ICD-10-CM | POA: Insufficient documentation

## 2015-03-28 DIAGNOSIS — O99012 Anemia complicating pregnancy, second trimester: Secondary | ICD-10-CM | POA: Insufficient documentation

## 2015-03-28 DIAGNOSIS — F329 Major depressive disorder, single episode, unspecified: Secondary | ICD-10-CM

## 2015-03-28 DIAGNOSIS — F419 Anxiety disorder, unspecified: Secondary | ICD-10-CM

## 2015-03-28 DIAGNOSIS — O99342 Other mental disorders complicating pregnancy, second trimester: Secondary | ICD-10-CM | POA: Diagnosis not present

## 2015-03-28 DIAGNOSIS — Z3A16 16 weeks gestation of pregnancy: Secondary | ICD-10-CM | POA: Insufficient documentation

## 2015-03-28 DIAGNOSIS — F418 Other specified anxiety disorders: Secondary | ICD-10-CM | POA: Insufficient documentation

## 2015-03-28 DIAGNOSIS — B9689 Other specified bacterial agents as the cause of diseases classified elsewhere: Secondary | ICD-10-CM | POA: Insufficient documentation

## 2015-03-28 DIAGNOSIS — O23592 Infection of other part of genital tract in pregnancy, second trimester: Secondary | ICD-10-CM | POA: Diagnosis not present

## 2015-03-28 DIAGNOSIS — F32A Depression, unspecified: Secondary | ICD-10-CM

## 2015-03-28 DIAGNOSIS — Z87891 Personal history of nicotine dependence: Secondary | ICD-10-CM | POA: Diagnosis not present

## 2015-03-28 DIAGNOSIS — D649 Anemia, unspecified: Secondary | ICD-10-CM | POA: Insufficient documentation

## 2015-03-28 DIAGNOSIS — O99019 Anemia complicating pregnancy, unspecified trimester: Secondary | ICD-10-CM

## 2015-03-28 DIAGNOSIS — R109 Unspecified abdominal pain: Secondary | ICD-10-CM | POA: Diagnosis present

## 2015-03-28 LAB — URINALYSIS, ROUTINE W REFLEX MICROSCOPIC
Bilirubin Urine: NEGATIVE
Glucose, UA: NEGATIVE mg/dL
Hgb urine dipstick: NEGATIVE
Ketones, ur: 15 mg/dL — AB
Leukocytes, UA: NEGATIVE
NITRITE: NEGATIVE
Protein, ur: NEGATIVE mg/dL
Specific Gravity, Urine: 1.025 (ref 1.005–1.030)
UROBILINOGEN UA: 0.2 mg/dL (ref 0.0–1.0)
pH: 6 (ref 5.0–8.0)

## 2015-03-28 LAB — WET PREP, GENITAL
TRICH WET PREP: NONE SEEN
Yeast Wet Prep HPF POC: NONE SEEN

## 2015-03-28 MED ORDER — METRONIDAZOLE 500 MG PO TABS: 500.0000 mg | ORAL_TABLET | Freq: Two times a day (BID) | ORAL | Status: DC

## 2015-03-28 NOTE — MAU Note (Signed)
Cramping in vagina. Started today, thought it would go away, but it is actually gotten worse and more intense

## 2015-03-28 NOTE — MAU Provider Note (Signed)
  History  29 yo [id number] @ 16.3 wks presents to MAU w/ c/o cramping since earlier today. Denies leaking or bleeding. Unsure quickening.   Patient Active Problem List   Diagnosis Date Noted  . Bacterial vaginosis 03/29/2015  . Anxiety 03/28/2015  . Depression 03/28/2015  . Anemia affecting pregnancy 03/28/2015  . Obesity 06/18/2013  . Migraine without aura 06/18/2013    Chief Complaint  Patient presents with  . Vaginal Pain   HPI As above OB History    Gravida Para Term Preterm AB TAB SAB Ectopic Multiple Living   4 2 1 1 1 1    2       Past Medical History  Diagnosis Date  . Headache(784.0)   . Anxiety   . Anemia 2009    s/p delivery  . Obesity   . Abortion     Past Surgical History  Procedure Laterality Date  . No past surgeries    . Dilation and curettage of uterus      Family History  Problem Relation Age of Onset  . Hypertension Mother   . Hypertension Paternal Aunt   . Cancer Maternal Grandfather     History  Substance Use Topics  . Smoking status: Former Smoker -- 0.50 packs/day    Quit date: 06/08/2013  . Smokeless tobacco: Never Used  . Alcohol Use: Yes     Comment: occasional when not pregnant     Allergies:  Allergies  Allergen Reactions  . Other     Has an allergy to some type of eye drop but does not remember the name (in childhood)    No prescriptions prior to admission    ROS  Cramping Physical Exam   Results for orders placed or performed during the hospital encounter of 03/28/15 (from the past 24 hour(s))  Urinalysis, Routine w reflex microscopic (not at [facility])     Status: Abnormal   Collection Time: 03/28/15  7:00 PM  Result Value Ref Range   Color, Urine YELLOW YELLOW   APPearance CLEAR CLEAR   Specific Gravity, Urine 1.025 1.005 - 1.030   pH 6.0 5.0 - 8.0   Glucose, UA NEGATIVE NEGATIVE mg/dL   Hgb urine dipstick NEGATIVE NEGATIVE   Bilirubin Urine NEGATIVE NEGATIVE   Ketones, ur 15 (A) NEGATIVE mg/dL   Protein, ur  NEGATIVE NEGATIVE mg/dL   Urobilinogen, UA 0.2 0.0 - 1.0 mg/dL   Nitrite NEGATIVE NEGATIVE   Leukocytes, UA NEGATIVE NEGATIVE  Wet prep, genital     Status: Abnormal   Collection Time: 03/28/15  7:48 PM  Result Value Ref Range   Yeast Wet Prep HPF POC NONE SEEN NONE SEEN   Trich, Wet Prep NONE SEEN NONE SEEN   Clue Cells Wet Prep HPF POC FEW (A) NONE SEEN   WBC, Wet Prep HPF POC FEW (A) NONE SEEN    Blood pressure 125/58, pulse 95, temperature 98 F (36.7 C), temperature source Oral, resp. rate 18, weight 138.347 kg (305 lb), last menstrual period 12/15/2014, not currently breastfeeding.  Physical Exam Gen: NAD Abdomen: gravid, soft, NT, no rebound or guarding Speculum: Small amount of adherent white discharge. +whiff. Sample collected for wet prep and genprobe. Cvx visually closed. Ext: 1+ calf/ankle edema FHR: 158 bpm ED Course  Assessment: BV  Plan: Metronidazole Encouraged hydration Advised mild soap Office f/u as previously scheduled  Sherre ScarletWILLIAMS, Arieliz Latino CNM, MS 03/28/15, 8:00 PM

## 2015-03-28 NOTE — Discharge Instructions (Signed)
Bacterial Vaginosis Bacterial vaginosis is a vaginal infection that occurs when the normal balance of bacteria in the vagina is disrupted. It results from an overgrowth of certain bacteria. This is the most common vaginal infection in women of childbearing age. Treatment is important to prevent complications, especially in pregnant women, as it can cause a premature delivery. CAUSES  Bacterial vaginosis is caused by an increase in harmful bacteria that are normally present in smaller amounts in the vagina. Several different kinds of bacteria can cause bacterial vaginosis. However, the reason that the condition develops is not fully understood. RISK FACTORS Certain activities or behaviors can put you at an increased risk of developing bacterial vaginosis, including:  Having a new sex partner or multiple sex partners.  Douching.  Using an intrauterine device (IUD) for contraception. Women do not get bacterial vaginosis from toilet seats, bedding, swimming pools, or contact with objects around them. SIGNS AND SYMPTOMS  Some women with bacterial vaginosis have no signs or symptoms. Common symptoms include:  Grey vaginal discharge.  A fishlike odor with discharge, especially after sexual intercourse.  Itching or burning of the vagina and vulva.  Burning or pain with urination. DIAGNOSIS  Your health care provider will take a medical history and examine the vagina for signs of bacterial vaginosis. A sample of vaginal fluid may be taken. Your health care provider will look at this sample under a microscope to check for bacteria and abnormal cells. A vaginal pH test may also be done.  TREATMENT  Bacterial vaginosis may be treated with antibiotic medicines. These may be given in the form of a pill or a vaginal cream. A second round of antibiotics may be prescribed if the condition comes back after treatment.  HOME CARE INSTRUCTIONS   Only take over-the-counter or prescription medicines as  directed by your health care provider.  If antibiotic medicine was prescribed, take it as directed. Make sure you finish it even if you start to feel better.  Do not have sex until treatment is completed.  Tell all sexual partners that you have a vaginal infection. They should see their health care provider and be treated if they have problems, such as a mild rash or itching.  Practice safe sex by using condoms and only having one sex partner. SEEK MEDICAL CARE IF:   Your symptoms are not improving after 3 days of treatment.  You have increased discharge or pain.  You have a fever. MAKE SURE YOU:   Understand these instructions.  Will watch your condition.  Will get help right away if you are not doing well or get worse. FOR MORE INFORMATION  Centers for Disease Control and Prevention, Division of STD Prevention: www.cdc.gov/std American Sexual Health Association (ASHA): www.ashastd.org  Document Released: 09/13/2005 Document Revised: 07/04/2013 Document Reviewed: 04/25/2013 ExitCare Patient Information 2015 [employer]. This information is not intended to replace advice given to you by your health care provider. Make sure you discuss any questions you have with your health care provider.  

## 2015-04-01 LAB — GC/CHLAMYDIA PROBE AMP (~~LOC~~) NOT AT ARMC
CHLAMYDIA, DNA PROBE: NEGATIVE
Neisseria Gonorrhea: NEGATIVE

## 2015-04-29 ENCOUNTER — Inpatient Hospital Stay (HOSPITAL_COMMUNITY): Payer: Medicaid Other

## 2015-04-29 ENCOUNTER — Encounter (HOSPITAL_COMMUNITY): Payer: Self-pay | Admitting: *Deleted

## 2015-04-29 ENCOUNTER — Inpatient Hospital Stay (HOSPITAL_COMMUNITY)
Admission: AD | Admit: 2015-04-29 | Discharge: 2015-04-29 | Disposition: A | Discharge status: Home / Self Care | Payer: Medicaid Other | Source: Ambulatory Visit | Attending: Obstetrics & Gynecology | Admitting: Obstetrics & Gynecology

## 2015-04-29 DIAGNOSIS — O09219 Supervision of pregnancy with history of pre-term labor, unspecified trimester: Secondary | ICD-10-CM

## 2015-04-29 DIAGNOSIS — R102 Pelvic and perineal pain: Secondary | ICD-10-CM | POA: Insufficient documentation

## 2015-04-29 DIAGNOSIS — Z87891 Personal history of nicotine dependence: Secondary | ICD-10-CM | POA: Insufficient documentation

## 2015-04-29 DIAGNOSIS — O99212 Obesity complicating pregnancy, second trimester: Secondary | ICD-10-CM | POA: Diagnosis not present

## 2015-04-29 DIAGNOSIS — R1030 Lower abdominal pain, unspecified: Secondary | ICD-10-CM | POA: Insufficient documentation

## 2015-04-29 DIAGNOSIS — M898X8 Other specified disorders of bone, other site: Secondary | ICD-10-CM | POA: Insufficient documentation

## 2015-04-29 DIAGNOSIS — Z3A21 21 weeks gestation of pregnancy: Secondary | ICD-10-CM | POA: Insufficient documentation

## 2015-04-29 DIAGNOSIS — O9989 Other specified diseases and conditions complicating pregnancy, childbirth and the puerperium: Secondary | ICD-10-CM | POA: Diagnosis not present

## 2015-04-29 DIAGNOSIS — O26899 Other specified pregnancy related conditions, unspecified trimester: Secondary | ICD-10-CM

## 2015-04-29 DIAGNOSIS — O09299 Supervision of pregnancy with other poor reproductive or obstetric history, unspecified trimester: Secondary | ICD-10-CM

## 2015-04-29 DIAGNOSIS — Z6841 Body Mass Index (BMI) 40.0 and over, adult: Secondary | ICD-10-CM | POA: Diagnosis not present

## 2015-04-29 LAB — URINALYSIS, ROUTINE W REFLEX MICROSCOPIC
BILIRUBIN URINE: NEGATIVE
Glucose, UA: NEGATIVE mg/dL
Hgb urine dipstick: NEGATIVE
Ketones, ur: NEGATIVE mg/dL
Leukocytes, UA: NEGATIVE
NITRITE: NEGATIVE
PH: 6 (ref 5.0–8.0)
PROTEIN: NEGATIVE mg/dL
SPECIFIC GRAVITY, URINE: 1.025 (ref 1.005–1.030)
Urobilinogen, UA: 0.2 mg/dL (ref 0.0–1.0)

## 2015-04-29 MED ORDER — IBUPROFEN 600 MG PO TABS: 600.0000 mg | ORAL_TABLET | Freq: Four times a day (QID) | ORAL | Status: DC | PRN

## 2015-04-29 MED ORDER — IBUPROFEN 600 MG PO TABS
600.0000 mg | ORAL_TABLET | Freq: Once | ORAL | Status: AC
  Administered 2015-04-29: 600 mg via ORAL
  Filled 2015-04-29: qty 1

## 2015-04-29 NOTE — MAU Provider Note (Signed)
History   29 yo [id number] at 21 weeks presented unannounced c/o pelvic pressure and lower abdominal pain since last night.  Worsened at work, elected to come to MAU.  Denies leaking or bleeding, reports +FM.  Hx previous PTD at 28 weeks in 12/2013--presented in labor, then had SROM, with rapid progression to delivery.  Had pp hemorrhage after delivery, treated with [employer] and fundal massage.  Prior pregnancy delivered at 40 weeks, no PTL.  Patient reports 17P was discussed at office visit, "but they didn't seem to know much about it", so she had not considered again.  Patient Active Problem List   Diagnosis Date Noted  . Previous preterm delivery, antepartum 04/29/2015  . Hx of postpartum hemorrhage, currently pregnant 04/29/2015  . Morbid obesity 04/29/2015  . Anxiety 03/28/2015  . Depression 03/28/2015  . Anemia affecting pregnancy 03/28/2015  . Migraine without aura 06/18/2013    Chief Complaint  Patient presents with  . Abdominal Pain   HPI:  As above  OB History    Gravida Para Term Preterm AB TAB SAB Ectopic Multiple Living   4 2 1 1 1 1    2       Past Medical History  Diagnosis Date  . Headache(784.0)   . Anxiety   . Anemia 2009    s/p delivery  . Obesity   . Abortion     Past Surgical History  Procedure Laterality Date  . Dilation and curettage of uterus      Family History  Problem Relation Age of Onset  . Hypertension Mother   . Hypertension Paternal Aunt   . Cancer Maternal Grandfather     History  Substance Use Topics  . Smoking status: Former Smoker -- 0.50 packs/day    Quit date: 06/08/2013  . Smokeless tobacco: Never Used  . Alcohol Use: Yes     Comment: occasional when not pregnant     Allergies:  Allergies  Allergen Reactions  . Other     Has an allergy to some type of eye drop but does not remember the name (in childhood)    Prescriptions prior to admission  Medication Sig Dispense Refill Last Dose  . acetaminophen (TYLENOL) 325  MG tablet Take 650 mg by mouth every 6 (six) hours as needed for mild pain, moderate pain or headache.    Past Week at Unknown time  . cyclobenzaprine (FLEXERIL) 10 MG tablet Take 10 mg by mouth 3 (three) times daily as needed for muscle spasms.   Past Week at Unknown time  . Prenatal Vit-Min-FA-Fish Oil ([employer] PRENATAL GUMMY) 0.4-113.5 MG CHEW Chew 2 tablets by mouth daily.   04/28/2015 at Unknown time    ROS:  Pelvic pain, pressure Physical Exam   Blood pressure 122/59, pulse 90, temperature 97.9 F (36.6 C), temperature source Oral, resp. rate 18, height 5\' 9"  (1.753 m), weight 138.347 kg (305 lb), last menstrual period 12/15/2014, not currently breastfeeding.    Physical Exam  In NAD Chest clear Heart RRR without murmur Abd gravid, NT.  Moderate tenderness over pubic symphysis.  Uterus soft, NT Pelvic--no d/c in vault.  Cervix appears closed.  Closed and long, but soft, to digital exam. Ext WNL  FHR 140s by doppler No UCs per palpation or toco  ED Course  Assessment: IUP at 21 weeks Pubic bone pain Hx PTD  Plan: UA [place] for cervical length Ibuprophen 600 mg po now.   Nigel Bridgeman CNM, MSN 04/29/2015 3:32 PM  Addendum: Feeling better after  Ibuprophen at 1520.  [place]:  SIUP, normal fluid, anterior placenta with marginal cord insertion, fetus transverse, cervix 4.2 cm.  Impression: Pain in symphysis pubis Hx PTD  Plan: D/C'd home with instructions for rest this week. Reiterated recommendation for 17P injections. Patient will consider and decide by visit on Friday, 05/02/15. S/S PTL reviewed.  Nigel Bridgeman, CNM 04/29/15 5:20p

## 2015-04-29 NOTE — MAU Note (Signed)
Patient presents at [redacted] weeks gestation with c/o lower abdominal pain since yesterday. Denies bleeding or discharge.

## 2015-04-29 NOTE — Discharge Instructions (Signed)
Take Motrin every 6 hours for the next 1-2 days, then as needed for pelvic pain.

## 2015-05-27 ENCOUNTER — Other Ambulatory Visit (HOSPITAL_COMMUNITY): Payer: Medicaid Other

## 2015-07-09 ENCOUNTER — Encounter (HOSPITAL_COMMUNITY): Payer: Self-pay

## 2015-07-09 ENCOUNTER — Inpatient Hospital Stay (HOSPITAL_COMMUNITY)
Admission: AD | Admit: 2015-07-09 | Discharge: 2015-07-09 | Disposition: A | Discharge status: Home / Self Care | Payer: Medicaid Other | Source: Ambulatory Visit | Attending: Obstetrics and Gynecology | Admitting: Obstetrics and Gynecology

## 2015-07-09 DIAGNOSIS — Z3A31 31 weeks gestation of pregnancy: Secondary | ICD-10-CM | POA: Insufficient documentation

## 2015-07-09 DIAGNOSIS — R1084 Generalized abdominal pain: Secondary | ICD-10-CM

## 2015-07-09 DIAGNOSIS — R1030 Lower abdominal pain, unspecified: Secondary | ICD-10-CM | POA: Insufficient documentation

## 2015-07-09 DIAGNOSIS — O0933 Supervision of pregnancy with insufficient antenatal care, third trimester: Secondary | ICD-10-CM | POA: Diagnosis not present

## 2015-07-09 DIAGNOSIS — R102 Pelvic and perineal pain: Secondary | ICD-10-CM

## 2015-07-09 DIAGNOSIS — Z87891 Personal history of nicotine dependence: Secondary | ICD-10-CM | POA: Diagnosis not present

## 2015-07-09 DIAGNOSIS — O26893 Other specified pregnancy related conditions, third trimester: Secondary | ICD-10-CM | POA: Insufficient documentation

## 2015-07-09 DIAGNOSIS — O26899 Other specified pregnancy related conditions, unspecified trimester: Secondary | ICD-10-CM

## 2015-07-09 LAB — URINALYSIS, ROUTINE W REFLEX MICROSCOPIC
BILIRUBIN URINE: NEGATIVE
Glucose, UA: NEGATIVE mg/dL
HGB URINE DIPSTICK: NEGATIVE
Ketones, ur: NEGATIVE mg/dL
Leukocytes, UA: NEGATIVE
Nitrite: NEGATIVE
PH: 6.5 (ref 5.0–8.0)
Protein, ur: NEGATIVE mg/dL
SPECIFIC GRAVITY, URINE: 1.025 (ref 1.005–1.030)
Urobilinogen, UA: 0.2 mg/dL (ref 0.0–1.0)

## 2015-07-09 NOTE — Discharge Instructions (Signed)
Ms. Sandra Wong, you are not in labor at this time. Your pelvic pain is likely due to [name] moving down in your pelvis, which is normal.  For pain, please use Tylenol rather than ibuprofen. You may also take your flexeril to relieve back pain.  Preterm Labor Information Preterm labor is when labor starts before you are [redacted] weeks pregnant. The normal length of pregnancy is 39 to 41 weeks.  CAUSES  The cause of preterm labor is not often known. The most common known cause is infection. RISK FACTORS  Having a history of preterm labor.  Having your water break before it should.  Having a placenta that covers the opening of the cervix.  Having a placenta that breaks away from the uterus.  Having a cervix that is too weak to hold the baby in the uterus.  Having too much fluid in the amniotic sac.  Taking drugs or smoking while pregnant.  Not gaining enough weight while pregnant.  Being younger than 6918 and older than 27 years old.  Having a low income.  Being African American. SYMPTOMS  Period-like cramps, belly (abdominal) pain, or back pain.  Contractions that are regular, as often as six in an hour. They may be mild or painful.  Contractions that start at the top of the belly. They then move to the lower belly and back.  Lower belly pressure that seems to get stronger.  Bleeding from the vagina.  Fluid leaking from the vagina. TREATMENT  Treatment depends on:  Your condition.  The condition of your baby.  How many weeks pregnant you are. Your doctor may have you:  Take medicine to stop contractions.  Stay in bed except to use the restroom (bed rest).  Stay in the hospital. WHAT SHOULD YOU DO IF YOU THINK YOU ARE IN PRETERM LABOR? Call your doctor right away. You need to go to the hospital right away.  HOW CAN YOU PREVENT PRETERM LABOR IN FUTURE PREGNANCIES?  Stop smoking, if you smoke.  Maintain healthy weight gain.  Do not take drugs or be around  chemicals that are not needed.  Tell your doctor if you think you have an infection.  Tell your doctor if you had a preterm labor before.   This information is not intended to replace advice given to you by your health care provider. Make sure you discuss any questions you have with your health care provider.   Document Released: 12/10/2008 Document Revised: 01/28/2015 Document Reviewed: 10/16/2012 Elsevier Interactive Patient Education [employer]2016 Elsevier Inc.

## 2015-07-09 NOTE — MAU Note (Addendum)
Patient presents with lower abdominal pain starting yesterday. Lower back pain. Denies bleeding and had small discharge. Fetus active.

## 2015-07-09 NOTE — MAU Provider Note (Signed)
History   CSN: [id number]  Arrival date and time: 07/09/15 [id number]   First Provider Initiated Contact with Patient 07/09/15 0912      Chief Complaint  Patient presents with  . Abdominal Pain   HPI  Patient is [id number] at 1724w1d presenting with lower abdominal pain.   CC lower abdominal pain - Pain began yesterday. She noted standing up and feeling like she had "a bowling ball in my crotch."  - Pain is constant and is not positional. - Feels like baby has dropped and "is hanging out." - Feels sore like menstrual pain without cramping - Has not tried anything for pain but has taken flexeril and ibuprofen earlier in pregnancy - Thought she passed some thick mucus when she urinated this morning - Denies VB, ctx. Endorses regular FM. - Has stress incontinence and wears a pad. Denies increased loss of fluid/needing to change pad more frequently. - Had nausea this morning but continues to be able to eat regularly. - Has had continued back pain for the last month. - [facility]; patient states dissatisfaction with care. Has not been accepted by any other practices yet. Does not have an anatomy scan.  - Has not taken 17-P  OB History    Gravida Para Term Preterm AB TAB SAB Ectopic Multiple Living   4 2 1 1 1 1    2      Past Medical History  Diagnosis Date  . Headache(784.0)   . Anxiety   . Anemia 2009    s/p delivery  . Obesity   . Abortion     Past Surgical History  Procedure Laterality Date  . Dilation and curettage of uterus      Family History  Problem Relation Age of Onset  . Hypertension Mother   . Hypertension Paternal Aunt   . Cancer Maternal Grandfather     Social History  Substance Use Topics  . Smoking status: Former Smoker -- 0.50 packs/day    Quit date: 06/08/2013  . Smokeless tobacco: Never Used  . Alcohol Use: Yes     Comment: occasional when not pregnant     Allergies:  Allergies  Allergen Reactions  . Other     Has  an allergy to some type of eye drop but does not remember the name (in childhood)    No prescriptions prior to admission   Review of Systems  Constitutional: Negative for fever and chills.  Eyes: Negative for blurred vision.  Neurological: Positive for headaches (improves with caffeine - limits to 1 cup a day).   Physical Exam   Blood pressure 113/66, pulse 98, temperature 97.8 F (36.6 C), temperature source Oral, last menstrual period 12/15/2014, not currently breastfeeding.  Physical Exam  Constitutional: She appears well-developed and well-nourished. No distress.  Cardiovascular: Normal rate.  Exam reveals no gallop and no friction rub.   No murmur heard. Respiratory: No respiratory distress.  GI: She exhibits mass (gravid, head low in pelvis). There is no tenderness.  GU: Cervix fingertip and thick.   MAU Course  Procedures  MDM Monitor patient vitals and fetal tracing to determine stability.   UA negative for nitrites or leukocytes, sp gravity 1.025  Toco shows uterine irritability FHT with good variability and accelerations  Assessment and Plan  Patient with history of PPROM presenting for lower abdominal pain since yesterday. Also with limited prenatal care. Ruled out preterm labor with closed cervix on exam and lack of contractions. Mother's vital signs  stable and exam negative for abdominal tenderness to palpation. FHT reassuring. Suspect lower abdominal pain is due to change in fetal position, lower in pelvis.   - Discharge home - Advised patient to take tylenol not ibuprofen for pain.  - Recommended maternity support belt to help with back pain.  - Counseled on preterm labor precautions and fetal kick counts - U/S for fetal anatomy ordered - [facility] 07/09/2015, 5:51 PM    OB FELLOW MAU DISCHARGE ATTESTATION  I have seen and examined this patient; I agree with above documentation in  the resident's note.    Silvano Bilis, MD 7:07 PM

## 2015-07-19 ENCOUNTER — Encounter: Payer: Self-pay | Admitting: *Deleted

## 2015-07-19 DIAGNOSIS — O09299 Supervision of pregnancy with other poor reproductive or obstetric history, unspecified trimester: Secondary | ICD-10-CM

## 2015-07-19 DIAGNOSIS — O0990 Supervision of high risk pregnancy, unspecified, unspecified trimester: Secondary | ICD-10-CM

## 2015-07-19 DIAGNOSIS — O99210 Obesity complicating pregnancy, unspecified trimester: Secondary | ICD-10-CM | POA: Insufficient documentation

## 2015-07-21 ENCOUNTER — Encounter: Payer: Self-pay | Admitting: *Deleted

## 2015-07-21 DIAGNOSIS — O0990 Supervision of high risk pregnancy, unspecified, unspecified trimester: Secondary | ICD-10-CM

## 2015-07-21 DIAGNOSIS — O99210 Obesity complicating pregnancy, unspecified trimester: Secondary | ICD-10-CM

## 2015-07-21 DIAGNOSIS — O09299 Supervision of pregnancy with other poor reproductive or obstetric history, unspecified trimester: Secondary | ICD-10-CM

## 2015-07-22 ENCOUNTER — Encounter (HOSPITAL_COMMUNITY): Payer: Self-pay | Admitting: *Deleted

## 2015-07-22 ENCOUNTER — Inpatient Hospital Stay (HOSPITAL_COMMUNITY)
Admission: AD | Admit: 2015-07-22 | Discharge: 2015-07-22 | Disposition: A | Discharge status: Home / Self Care | Payer: Medicaid Other | Source: Ambulatory Visit | Attending: Obstetrics & Gynecology | Admitting: Obstetrics & Gynecology

## 2015-07-22 DIAGNOSIS — O26893 Other specified pregnancy related conditions, third trimester: Secondary | ICD-10-CM | POA: Insufficient documentation

## 2015-07-22 DIAGNOSIS — E669 Obesity, unspecified: Secondary | ICD-10-CM

## 2015-07-22 DIAGNOSIS — O0993 Supervision of high risk pregnancy, unspecified, third trimester: Secondary | ICD-10-CM

## 2015-07-22 DIAGNOSIS — M545 Low back pain: Secondary | ICD-10-CM | POA: Insufficient documentation

## 2015-07-22 DIAGNOSIS — O09292 Supervision of pregnancy with other poor reproductive or obstetric history, second trimester: Secondary | ICD-10-CM

## 2015-07-22 DIAGNOSIS — O0992 Supervision of high risk pregnancy, unspecified, second trimester: Secondary | ICD-10-CM

## 2015-07-22 DIAGNOSIS — Z87891 Personal history of nicotine dependence: Secondary | ICD-10-CM | POA: Diagnosis not present

## 2015-07-22 DIAGNOSIS — R102 Pelvic and perineal pain: Secondary | ICD-10-CM | POA: Insufficient documentation

## 2015-07-22 DIAGNOSIS — O99210 Obesity complicating pregnancy, unspecified trimester: Secondary | ICD-10-CM

## 2015-07-22 DIAGNOSIS — Z3A33 33 weeks gestation of pregnancy: Secondary | ICD-10-CM | POA: Insufficient documentation

## 2015-07-22 LAB — URINALYSIS, ROUTINE W REFLEX MICROSCOPIC
Bilirubin Urine: NEGATIVE
Glucose, UA: NEGATIVE mg/dL
Hgb urine dipstick: NEGATIVE
KETONES UR: NEGATIVE mg/dL
LEUKOCYTES UA: NEGATIVE
NITRITE: NEGATIVE
PROTEIN: NEGATIVE mg/dL
Specific Gravity, Urine: >1.03 — ABNORMAL HIGH (ref 1.005–1.030)
Urobilinogen, UA: 1 mg/dL (ref 0.0–1.0)
pH: 6 (ref 5.0–8.0)

## 2015-07-22 NOTE — MAU Note (Signed)
Has had no prenatal care since she was [redacted] weeks gestation; she had been seeing [facility] but is scheduled for 1st visit with clinic; hx of preterm delivery at [redacted] weeks gestation ( last pregnancy); c/o lower abdominal and lower back pain since this morning; no ucs palpated or noted on tracing;

## 2015-07-22 NOTE — MAU Note (Signed)
Urine sent to lab 

## 2015-07-22 NOTE — MAU Note (Signed)
Pt to rm via wc from desk, c/o pain

## 2015-07-22 NOTE — MAU Note (Signed)
C/o abdominal cramping and vaginal pressure since 0300 this Am;

## 2015-07-22 NOTE — MAU Provider Note (Signed)
History  Sandra Wong is a 29 y.o. [phone] woman with a history of preterm labor  Presents at [redacted]w[redacted]d to the MAU for labor r/o. Sandra Wong reports the onset of "unbearable" bilateral lower abdominal and low back pain since around 0300 this morning. She states the pain is constant and crampy in nature and is unsure if they are contractions. She has tried Tylenol, Flexeril, positional changes, and warm compress without any relief. She denies any vaginal bleeding or loss of fluid. She endorses good fetal movement. Sandra Wong was seen in MAU about 1 week ago for similar complaint and was evaluated to be closed at that time.  CSN: [id number]  Arrival date and time: 07/22/15 1478  Chief Complaint  Patient presents with  . Abdominal Pain   HPI   Sandra Wong was receiving prenatal care at [facility] throughout her first trimester, however she has had a lapse in prenatal care since then. She states that they routinely cancelled her appointments at the last minute and had difficulty rescheduling them, therefore she left the practice. However, she did not realize that many other practices would refuse her as a new patient after [redacted] weeks EGA. She has an upcoming appointment at the [facility] on Nov. 2. Her most recent prenatal visit was in August. She denies any personal history of pre-eclampsia, hypertension, DM, gestational DM.   Clinic   High Risk transfer from [facility] Prenatal Labs  Dating U/S 02/14/15 - EDD 09/09/15 Blood type:   O+  Genetic Screen 1 Screen:    AFP: Neg    Quad:     NIPS: Antibody: negative  Anatomic [place]  not done Rubella:  immune  GTT Early: not done          Third trimester:  RPR:   NR  Flu vaccine  HBsAg:   Neg  TDaP vaccine                                               Rhogam: HIV:   NR  Baby Food  formula                                              GBS: (For PCN allergy, check sensitivities)  Contraception  paraguard Pap: 06/18/13 WNL  Circumcision   yes   Pediatrician     Support Person        OB History  Gravida Para Term Preterm AB SAB TAB Ectopic Multiple Living  0 0 0 0 2    # Outcome Date GA Lbr Len/2nd Weight Sex Delivery Anes PTL Lv  4 Current           3 AB 06/2014 [redacted]w[redacted]d         2 Preterm 01/03/14 [redacted]w[redacted]d 01:23 2 lb 6.5 oz (1.091 kg) M Vag-Spont None  Y  1 Term 04/08/08 [redacted]w[redacted]d  7 lb 6 oz (3.345 kg) M Vag-Spont EPI  Y     Comments: induced    Obstetric Comments  2015 - attempted magnesium, SROM, rapid delivery, postpartum hemorrhage - cytotec    Past Medical History  Diagnosis Date  . Headache(784.0)   . Anxiety   . Anemia 2009  s/p delivery  . Obesity   . Abortion   . Depression   . GERD (gastroesophageal reflux disease)   . Sleep apnea   . Chronic back pain     Past Surgical History  Procedure Laterality Date  . Dilation and curettage of uterus    . Induced abortion      Family History  Problem Relation Age of Onset  . Hypertension Mother   . Hypertension Paternal Aunt   . Cancer Maternal Grandfather   . Stroke Maternal Grandfather   . Hypertension Father   . Asthma Son   . Kidney disease Paternal Grandmother   . Kidney disease Paternal Grandfather     Social History  Substance Use Topics  . Smoking status: Former Smoker -- 0.25 packs/day for 3 years    Quit date: 06/08/2013  . Smokeless tobacco: Never Used  . Alcohol Use: No     Comment: occasional when not pregnant     Allergies:  Allergies  Allergen Reactions  . Other     Has an allergy to some type of eye drop but does not remember the name (in childhood)    No prescriptions prior to admission    Review of Systems  Constitutional: Negative for fever and chills.  HENT: Positive for nosebleeds.   Eyes: Negative for blurred vision and double vision.  Respiratory: Negative for cough and shortness of breath.   Cardiovascular: Positive for PND. Negative for chest pain.  Gastrointestinal: Positive for abdominal pain.  Negative for heartburn, nausea, vomiting, diarrhea and constipation.  Genitourinary: Negative for dysuria and hematuria.  Musculoskeletal: Positive for back pain and joint pain.  Skin: Negative for rash.  Neurological: Negative for headaches.   Physical Exam   Filed Vitals:   07/22/15 1151  BP: 118/55  Pulse: 90  Temp:   Resp: 18  , Physical Exam   GEN: alert, uncomfortable-appearing woman resting in bed in NAD PULM: CTAB on frontal field exam CV: RRR, S1 and S2 heard, no M/R/G appreciated ABD: fundus palpable above umbilicus. Abdomen appropriately TTP at pubis/suprabupic area and groin bilaterally. No epigastric or RUQ pain. No guarding. GU: Cervix fingertip / 40% / high EXTR: No LE edema or calf tenderness.  FHT: HR 135 / moderate variability / +accels / no deccels Toco: quiet  Recent Results (from the past 2160 hour(s))  Urinalysis, Routine w reflex microscopic (not at [facility])     Status: None   Collection Time: 04/29/15  2:16 PM  Result Value Ref Range   Color, Urine YELLOW YELLOW   APPearance CLEAR CLEAR   Specific Gravity, Urine 1.025 1.005 - 1.030   pH 6.0 5.0 - 8.0   Glucose, UA NEGATIVE NEGATIVE mg/dL   Hgb urine dipstick NEGATIVE NEGATIVE   Bilirubin Urine NEGATIVE NEGATIVE   Ketones, ur NEGATIVE NEGATIVE mg/dL   Protein, ur NEGATIVE NEGATIVE mg/dL   Urobilinogen, UA 0.2 0.0 - 1.0 mg/dL   Nitrite NEGATIVE NEGATIVE   Leukocytes, UA NEGATIVE NEGATIVE    Comment: MICROSCOPIC NOT DONE ON URINES WITH NEGATIVE PROTEIN, BLOOD, LEUKOCYTES, NITRITE, OR GLUCOSE <1000 mg/dL.  Urinalysis, Routine w reflex microscopic (not at [facility])     Status: None   Collection Time: 07/09/15  8:34 AM  Result Value Ref Range   Color, Urine YELLOW YELLOW   APPearance CLEAR CLEAR   Specific Gravity, Urine 1.025 1.005 - 1.030   pH 6.5 5.0 - 8.0   Glucose, UA NEGATIVE NEGATIVE mg/dL   Hgb urine dipstick NEGATIVE NEGATIVE  Bilirubin Urine NEGATIVE NEGATIVE   Ketones, ur NEGATIVE NEGATIVE  mg/dL   Protein, ur NEGATIVE NEGATIVE mg/dL   Urobilinogen, UA 0.2 0.0 - 1.0 mg/dL   Nitrite NEGATIVE NEGATIVE   Leukocytes, UA NEGATIVE NEGATIVE    Comment: MICROSCOPIC NOT DONE ON URINES WITH NEGATIVE PROTEIN, BLOOD, LEUKOCYTES, NITRITE, OR GLUCOSE <1000 mg/dL.  Urinalysis, Routine w reflex microscopic (not at [facility])     Status: Abnormal   Collection Time: 07/22/15 11:40 AM  Result Value Ref Range   Color, Urine YELLOW YELLOW   APPearance CLEAR CLEAR   Specific Gravity, Urine >1.030 (H) 1.005 - 1.030   pH 6.0 5.0 - 8.0   Glucose, UA NEGATIVE NEGATIVE mg/dL   Hgb urine dipstick NEGATIVE NEGATIVE   Bilirubin Urine NEGATIVE NEGATIVE   Ketones, ur NEGATIVE NEGATIVE mg/dL   Protein, ur NEGATIVE NEGATIVE mg/dL   Urobilinogen, UA 1.0 0.0 - 1.0 mg/dL   Nitrite NEGATIVE NEGATIVE   Leukocytes, UA NEGATIVE NEGATIVE    Comment: MICROSCOPIC NOT DONE ON URINES WITH NEGATIVE PROTEIN, BLOOD, LEUKOCYTES, NITRITE, OR GLUCOSE <1000 mg/dL.      MAU Course  Procedures  Category I strip No contractions noted on tocometer UA collected and sent During the encounter patient became tearful. She was questioned about her mood and counseled regarding signs of depression. She was also counseled about SSRIs and that they are available to her at this visit, her next appointment with [facility], or anytime postpartum if she would like to try them. She reports she is not ready to start anything today but would like to think about it.   Assessment and Plan  This is a 29 y.o. [id number] with a history of PPROM and precipitous preterm delivery who presents to the MAU with complaints of bilateral lower abdominal and low back pain since 3:00 am this morning without vaginal bleeding or loss of fluid. -- Patient does not appear to be in labor -- Symptoms most c/w round ligament pain - urinalysis unremarkable -- Instructed patient to continue with PRN Flexeril as prescribed, occasional PRN Tylenol, positional therapy, and to  swim if she has access to a pool. -- Has appt with [facility] scheduled for Nov. 2 -- Needs anatomy scan, will order -- Evaluate mood at next outpatient visit and consider SSRI as appropriate -- Safe for d/c home with labor precautions: patient was counseled to return with any VB, LOF, decr FM, regular breath-taking contractions, severe headache, etc.  Gerri Sporeaitlin Sullivan, MD Resident physician   OB FELLOW MAU DISCHARGE ATTESTATION  I have seen and examined this patient; I agree with above documentation in the resident's note.    Silvano BilisNoah B Jazzmen Restivo, MD 12:29 PM

## 2015-07-22 NOTE — Discharge Instructions (Signed)
Round Ligament Pain  The round ligament is a cord of muscle and tissue that helps to support the uterus. It can become a source of pain during pregnancy if it becomes stretched or twisted as the baby grows. The pain usually begins in the second trimester of pregnancy, and it can come and go until the baby is delivered. It is not a serious problem, and it does not cause harm to the baby.  Round ligament pain is usually a short, sharp, and pinching pain, but it can also be a dull, lingering, and aching pain. The pain is felt in the lower side of the abdomen or in the groin. It usually starts deep in the groin and moves up to the outside of the hip area. Pain can occur with:   A sudden change in position.   Rolling over in bed.   Coughing or sneezing.   Physical activity.  HOME CARE INSTRUCTIONS  Watch your condition for any changes. Take these steps to help with your pain:   When the pain starts, relax. Then try:    Sitting down.    Flexing your knees up to your abdomen.    Lying on your side with one pillow under your abdomen and another pillow between your legs.    Sitting in a warm bath for 15-20 minutes or until the pain goes away.   Take over-the-counter and prescription medicines only as told by your health care provider.   Move slowly when you sit and stand.   Avoid long walks if they cause pain.   Stop or lessen your physical activities if they cause pain.  SEEK MEDICAL CARE IF:   Your pain does not go away with treatment.   You feel pain in your back that you did not have before.   Your medicine is not helping.  SEEK IMMEDIATE MEDICAL CARE IF:   You develop a fever or chills.   You develop uterine contractions.   You develop vaginal bleeding.   You develop nausea or vomiting.   You develop diarrhea.   You have pain when you urinate.     This information is not intended to replace advice given to you by your health care provider. Make sure you discuss any questions you have with your health  care provider.     Document Released: 06/22/2008 Document Revised: 12/06/2011 Document Reviewed: 11/20/2014  Elsevier Interactive Patient Education 2016 Elsevier Inc.

## 2015-07-22 NOTE — MAU Note (Signed)
Scheduled to be seen in hospital clinic but has not had any prenatal care since she was 22 weeks; she was seeing [facility] up until 22 weeks; desires to have an ultrasound; hx of preterm delivery @

## 2015-07-30 ENCOUNTER — Encounter: Payer: Medicaid Other | Admitting: Family

## 2015-07-31 ENCOUNTER — Inpatient Hospital Stay (HOSPITAL_COMMUNITY)
Admission: AD | Admit: 2015-07-31 | Discharge: 2015-07-31 | Disposition: A | Discharge status: Home / Self Care | Payer: Medicaid Other | Source: Ambulatory Visit | Attending: Obstetrics and Gynecology | Admitting: Obstetrics and Gynecology

## 2015-07-31 ENCOUNTER — Encounter (HOSPITAL_COMMUNITY): Payer: Self-pay | Admitting: *Deleted

## 2015-07-31 DIAGNOSIS — R109 Unspecified abdominal pain: Secondary | ICD-10-CM

## 2015-07-31 DIAGNOSIS — Z3493 Encounter for supervision of normal pregnancy, unspecified, third trimester: Secondary | ICD-10-CM | POA: Insufficient documentation

## 2015-07-31 DIAGNOSIS — O26899 Other specified pregnancy related conditions, unspecified trimester: Secondary | ICD-10-CM

## 2015-07-31 DIAGNOSIS — O368130 Decreased fetal movements, third trimester, not applicable or unspecified: Secondary | ICD-10-CM | POA: Diagnosis not present

## 2015-07-31 DIAGNOSIS — O288 Other abnormal findings on antenatal screening of mother: Secondary | ICD-10-CM

## 2015-07-31 LAB — URINALYSIS, ROUTINE W REFLEX MICROSCOPIC
BILIRUBIN URINE: NEGATIVE
Glucose, UA: NEGATIVE mg/dL
Hgb urine dipstick: NEGATIVE
Ketones, ur: 15 mg/dL — AB
Leukocytes, UA: NEGATIVE
NITRITE: NEGATIVE
PH: 6 (ref 5.0–8.0)
Protein, ur: NEGATIVE mg/dL
Urobilinogen, UA: 1 mg/dL (ref 0.0–1.0)

## 2015-07-31 NOTE — MAU Note (Signed)
Has been cramping in lower abd, stomach knots up.  Hasn't really felt the baby move today.

## 2015-08-06 ENCOUNTER — Encounter: Payer: Self-pay | Admitting: Student

## 2015-08-06 ENCOUNTER — Ambulatory Visit (INDEPENDENT_AMBULATORY_CARE_PROVIDER_SITE_OTHER): Payer: Medicaid Other | Admitting: Student

## 2015-08-06 VITALS — BP 122/75 | HR 112 | Temp 98.3°F | Wt 296.8 lb

## 2015-08-06 DIAGNOSIS — Z23 Encounter for immunization: Secondary | ICD-10-CM

## 2015-08-06 DIAGNOSIS — O09213 Supervision of pregnancy with history of pre-term labor, third trimester: Secondary | ICD-10-CM

## 2015-08-06 DIAGNOSIS — O99343 Other mental disorders complicating pregnancy, third trimester: Secondary | ICD-10-CM

## 2015-08-06 DIAGNOSIS — F329 Major depressive disorder, single episode, unspecified: Secondary | ICD-10-CM

## 2015-08-06 DIAGNOSIS — O0993 Supervision of high risk pregnancy, unspecified, third trimester: Secondary | ICD-10-CM

## 2015-08-06 DIAGNOSIS — F32A Depression, unspecified: Secondary | ICD-10-CM

## 2015-08-06 DIAGNOSIS — O09212 Supervision of pregnancy with history of pre-term labor, second trimester: Secondary | ICD-10-CM

## 2015-08-06 LAB — POCT URINALYSIS DIP (DEVICE)
Bilirubin Urine: NEGATIVE
GLUCOSE, UA: NEGATIVE mg/dL
Hgb urine dipstick: NEGATIVE
Ketones, ur: NEGATIVE mg/dL
Leukocytes, UA: NEGATIVE
NITRITE: NEGATIVE
PH: 6.5 (ref 5.0–8.0)
PROTEIN: NEGATIVE mg/dL
Specific Gravity, Urine: 1.025 (ref 1.005–1.030)
UROBILINOGEN UA: 0.2 mg/dL (ref 0.0–1.0)

## 2015-08-06 MED ORDER — SERTRALINE HCL 50 MG PO TABS: 50.0000 mg | ORAL_TABLET | Freq: Every day | ORAL | Status: DC

## 2015-08-06 MED ORDER — PRENATAL VITAMINS PLUS 27-1 MG PO TABS: 1.0000 | ORAL_TABLET | Freq: Every day | ORAL | Status: DC

## 2015-08-06 MED ORDER — TETANUS-DIPHTH-ACELL PERTUSSIS 5-2.5-18.5 LF-MCG/0.5 IM SUSP
0.5000 mL | Freq: Once | INTRAMUSCULAR | Status: AC
  Administered 2015-08-06: 0.5 mL via INTRAMUSCULAR

## 2015-08-06 NOTE — Patient Instructions (Signed)
Major Depressive Disorder Major depressive disorder is a mental illness. It also may be called clinical depression or unipolar depression. Major depressive disorder usually causes feelings of sadness, hopelessness, or helplessness. Some people with this disorder do not feel particularly sad but lose interest in doing things they used to enjoy (anhedonia). Major depressive disorder also can cause physical symptoms. It can interfere with work, school, relationships, and other normal everyday activities. The disorder varies in severity but is longer lasting and more serious than the sadness we all feel from time to time in our lives. Major depressive disorder often is triggered by stressful life events or major life changes. Examples of these triggers include divorce, loss of your job or home, a move, and the death of a family member or close friend. Sometimes this disorder occurs for no obvious reason at all. People who have family members with major depressive disorder or bipolar disorder are at higher risk for developing this disorder, with or without life stressors. Major depressive disorder can occur at any age. It may occur just once in your life (single episode major depressive disorder). It may occur multiple times (recurrent major depressive disorder). SYMPTOMS People with major depressive disorder have either anhedonia or depressed mood on nearly a daily basis for at least 2 weeks or longer. Symptoms of depressed mood include:  Feelings of sadness (blue or down in the dumps) or emptiness.  Feelings of hopelessness or helplessness.  Tearfulness or episodes of crying (may be observed by others).  Irritability (children and adolescents). In addition to depressed mood or anhedonia or both, people with this disorder have at least four of the following symptoms:  Difficulty sleeping or sleeping too much.   Significant change (increase or decrease) in appetite or weight.   Lack of energy or  motivation.  Feelings of guilt and worthlessness.   Difficulty concentrating, remembering, or making decisions.  Unusually slow movement (psychomotor retardation) or restlessness (as observed by others).   Recurrent wishes for death, recurrent thoughts of self-harm (suicide), or a suicide attempt. People with major depressive disorder commonly have persistent negative thoughts about themselves, other people, and the world. People with severe major depressive disorder may experiencedistorted beliefs or perceptions about the world (psychotic delusions). They also may see or hear things that are not real (psychotic hallucinations). DIAGNOSIS Major depressive disorder is diagnosed through an assessment by your health care provider. Your health care provider will ask aboutaspects of your daily life, such as mood,sleep, and appetite, to see if you have the diagnostic symptoms of major depressive disorder. Your health care provider may ask about your medical history and use of alcohol or drugs, including prescription medicines. Your health care provider also may do a physical exam and blood work. This is because certain medical conditions and the use of certain substances can cause major depressive disorder-like symptoms (secondary depression). Your health care provider also may refer you to a mental health specialist for further evaluation and treatment. TREATMENT It is important to recognize the symptoms of major depressive disorder and seek treatment. The following treatments can be prescribed for this disorder:   Medicine. Antidepressant medicines usually are prescribed. Antidepressant medicines are thought to correct chemical imbalances in the brain that are commonly associated with major depressive disorder. Other types of medicine may be added if the symptoms do not respond to antidepressant medicines alone or if psychotic delusions or hallucinations occur.  Talk therapy. Talk therapy can be  helpful in treating major depressive disorder by providing   support, education, and guidance. Certain types of talk therapy also can help with negative thinking (cognitive behavioral therapy) and with relationship issues that trigger this disorder (interpersonal therapy). A mental health specialist can help determine which treatment is best for you. Most people with major depressive disorder do well with a combination of medicine and talk therapy. Treatments involving electrical stimulation of the brain can be used in situations with extremely severe symptoms or when medicine and talk therapy do not work over time. These treatments include electroconvulsive therapy, transcranial magnetic stimulation, and vagal nerve stimulation.   This information is not intended to replace advice given to you by your health care provider. Make sure you discuss any questions you have with your health care provider.   Document Released: 01/08/2013 Document Revised: 10/04/2014 Document Reviewed: 01/08/2013 Elsevier Interactive Patient Education 2016 Elsevier Inc.   Sertraline tablets What is this medicine? SERTRALINE (SER tra leen) is used to treat depression. It may also be used to treat obsessive compulsive disorder, panic disorder, post-trauma stress, premenstrual dysphoric disorder (PMDD) or social anxiety. This medicine may be used for other purposes; ask your health care provider or pharmacist if you have questions. What should I tell my health care provider before I take this medicine? They need to know if you have any of these conditions: -bipolar disorder or a family history of bipolar disorder -diabetes -glaucoma -heart disease -high blood pressure -history of irregular heartbeat -history of low levels of calcium, magnesium, or potassium in the blood -if you often drink alcohol -liver disease -receiving electroconvulsive therapy -seizures -suicidal thoughts, plans, or attempt; a previous suicide  attempt by you or a family member -thyroid disease -an unusual or allergic reaction to sertraline, other medicines, foods, dyes, or preservatives -pregnant or trying to get pregnant -breast-feeding How should I use this medicine? Take this medicine by mouth with a glass of water. Follow the directions on the prescription label. You can take it with or without food. Take your medicine at regular intervals. Do not take your medicine more often than directed. Do not stop taking this medicine suddenly except upon the advice of your doctor. Stopping this medicine too quickly may cause serious side effects or your condition may worsen. A special MedGuide will be given to you by the pharmacist with each prescription and refill. Be sure to read this information carefully each time. Talk to your pediatrician regarding the use of this medicine in children. While this drug may be prescribed for children as young as 7 years for selected conditions, precautions do apply. Overdosage: If you think you have taken too much of this medicine contact a poison control center or emergency room at once. NOTE: This medicine is only for you. Do not share this medicine with others. What if I miss a dose? If you miss a dose, take it as soon as you can. If it is almost time for your next dose, take only that dose. Do not take double or extra doses. What may interact with this medicine? Do not take this medicine with any of the following medications: -certain medicines for fungal infections like fluconazole, itraconazole, ketoconazole, posaconazole, voriconazole -cisapride -disulfiram -dofetilide -linezolid -MAOIs like Carbex, Eldepryl, Marplan, Nardil, and Parnate -metronidazole -methylene blue (injected into a vein) -pimozide -thioridazine -ziprasidone This medicine may also interact with the following medications: -alcohol -aspirin and aspirin-like medicines -certain medicines for depression, anxiety, or  psychotic disturbances -certain medicines for irregular heart beat like flecainide, propafenone -certain medicines for migraine headaches like  almotriptan, eletriptan, frovatriptan, naratriptan, rizatriptan, sumatriptan, zolmitriptan -certain medicines for sleep -certain medicines for seizures like carbamazepine, valproic acid, phenytoin -certain medicines that treat or prevent blood clots like warfarin, enoxaparin, dalteparin -cimetidine -digoxin -diuretics -fentanyl -furazolidone -isoniazid -lithium -NSAIDs, medicines for pain and inflammation, like ibuprofen or naproxen -other medicines that prolong the QT interval (cause an abnormal heart rhythm) -procarbazine -rasagiline -supplements like St. John's wort, kava kava, valerian -tolbutamide -tramadol -tryptophan This list may not describe all possible interactions. Give your health care provider a list of all the medicines, herbs, non-prescription drugs, or dietary supplements you use. Also tell them if you smoke, drink alcohol, or use illegal drugs. Some items may interact with your medicine. What should I watch for while using this medicine? Tell your doctor if your symptoms do not get better or if they get worse. Visit your doctor or health care professional for regular checks on your progress. Because it may take several weeks to see the full effects of this medicine, it is important to continue your treatment as prescribed by your doctor. Patients and their families should watch out for new or worsening thoughts of suicide or depression. Also watch out for sudden changes in feelings such as feeling anxious, agitated, panicky, irritable, hostile, aggressive, impulsive, severely restless, overly excited and hyperactive, or not being able to sleep. If this happens, especially at the beginning of treatment or after a change in dose, call your health care professional. Bonita QuinYou may get drowsy or dizzy. Do not drive, use machinery, or do  anything that needs mental alertness until you know how this medicine affects you. Do not stand or sit up quickly, especially if you are an older patient. This reduces the risk of dizzy or fainting spells. Alcohol may interfere with the effect of this medicine. Avoid alcoholic drinks. Your mouth may get dry. Chewing sugarless gum or sucking hard candy, and drinking plenty of water may help. Contact your doctor if the problem does not go away or is severe. What side effects may I notice from receiving this medicine? Side effects that you should report to your doctor or health care professional as soon as possible: -allergic reactions like skin rash, itching or hives, swelling of the face, lips, or tongue -black or bloody stools, blood in the urine or vomit -fast, irregular heartbeat -feeling faint or lightheaded, falls -hallucination, loss of contact with reality -seizures -suicidal thoughts or other mood changes -unusual bleeding or bruising -unusually weak or tired -vomiting Side effects that usually do not require medical attention (report to your doctor or health care professional if they continue or are bothersome): -change in appetite -change in sex drive or performance -diarrhea -increased sweating -indigestion, nausea -tremors This list may not describe all possible side effects. Call your doctor for medical advice about side effects. You may report side effects to FDA at 1-800-FDA-1088. Where should I keep my medicine? Keep out of the reach of children. Store at room temperature between 15 and 30 degrees C (59 and 86 degrees F). Throw away any unused medicine after the expiration date. NOTE: This sheet is a summary. It may not cover all possible information. If you have questions about this medicine, talk to your doctor, pharmacist, or health care provider.    2016, Elsevier/Gold Standard. (2013-04-10 12:57:35)

## 2015-08-06 NOTE — Progress Notes (Signed)
Subjective:  Sandra RetortShanikwa Hohn is a 29 y.o. [id number] at 5112w1d being seen today for ongoing prenatal care.  Patient reports depression.  Contractions: Not present.  Vag. Bleeding: None. Movement: Present. Denies leaking of fluid.  Patient reports depressed feelings & "mood swings" for the last month. States cries every other day for no reason. Requesting anti depressant. Was discussed with her during MAU visit 2 weeks ago. Denies SI/HI.  The following portions of the patient's history were reviewed and updated as appropriate: allergies, current medications, past family history, past medical history, past social history, past surgical history and problem list. Problem list updated.  Objective:   Filed Vitals:   08/06/15 0857  BP: 122/75  Pulse: 112  Temp: 98.3 F (36.8 C)  Weight: 296 lb 12.8 oz (134.628 kg)    Fetal Status: Fetal Heart Rate (bpm): 150 Fundal Height: 32 cm Movement: Present     General:  Alert, oriented and cooperative. Patient is in no acute distress.  Skin: Skin is warm and dry. No rash noted.   Cardiovascular: Normal heart rate noted  Respiratory: Normal respiratory effort, no problems with respiration noted  Abdomen: Soft, gravid, appropriate for gestational age. Pain/Pressure: Present     Pelvic: Vag. Bleeding: None     Cervical exam deferred        Extremities: Normal range of motion.  Edema: Trace  Mental Status: Normal mood and affect. Normal behavior. Normal judgment and thought content.   Urinalysis: Urine Protein: Negative Urine Glucose: Negative  Assessment and Plan:  Pregnancy: [id number] at 7312w1d  1. Previous preterm delivery, antepartum, second trimester -ultrasound scheduled 11/15 - Prenatal Vit-Fe Fumarate-FA (PRENATAL VITAMINS PLUS) 27-1 MG TABS; Take 1 tablet by mouth daily.  Dispense: 30 tablet; Refill: 6 - Glucose Tolerance, 1 HR (50g) w/o Fasting - Prescript Monitor Profile(19) - Tdap (BOOSTRIX) injection 0.5 mL; Inject 0.5 mLs into the muscle  once.  2. Supervision of high-risk pregnancy, third trimester   3. Depression affecting pregnancy in third trimester, antepartum  - sertraline (ZOLOFT) 50 MG tablet; Take 1 tablet (50 mg total) by mouth daily.  Dispense: 30 tablet; Refill: 0  Preterm labor symptoms and general obstetric precautions including but not limited to vaginal bleeding, contractions, leaking of fluid and fetal movement were reviewed in detail with the patient. Please refer to After Visit Summary for other counseling recommendations.  Return in about 1 week (around 08/13/2015).   Judeth HornErin Yobani Schertzer, NP

## 2015-08-06 NOTE — Progress Notes (Signed)
Here for initial prenatal visit. States [facility]. Last visit there in August. Given new prenatal education booklets. States baby moving less yesterday but has felt baby good today. Declines flu shot.

## 2015-08-07 LAB — PRESCRIPTION MONITORING PROFILE (19 PANEL)
Amphetamine/Meth: NEGATIVE ng/mL
BARBITURATE SCREEN, URINE: NEGATIVE ng/mL
Benzodiazepine Screen, Urine: NEGATIVE ng/mL
Buprenorphine, Urine: NEGATIVE ng/mL
CANNABINOID SCRN UR: NEGATIVE ng/mL
CARISOPRODOL, URINE: NEGATIVE ng/mL
COCAINE METABOLITES: NEGATIVE ng/mL
Creatinine, Urine: 192.71 mg/dL (ref >20.0)
Fentanyl, Ur: NEGATIVE ng/mL
MDMA URINE: NEGATIVE ng/mL
MEPERIDINE UR: NEGATIVE ng/mL
METHADONE SCREEN, URINE: NEGATIVE ng/mL
Methaqualone: NEGATIVE ng/mL
Nitrites, Initial: NEGATIVE ug/mL
OPIATE SCREEN, URINE: NEGATIVE ng/mL
OXYCODONE SCRN UR: NEGATIVE ng/mL
PHENCYCLIDINE, UR: NEGATIVE ng/mL
PROPOXYPHENE: NEGATIVE ng/mL
TAPENTADOLUR: NEGATIVE ng/mL
TRAMADOL UR: NEGATIVE ng/mL
Zolpidem, Urine: NEGATIVE ng/mL
pH, Initial: 6.2 pH (ref 4.5–8.9)

## 2015-08-07 LAB — GLUCOSE TOLERANCE, 1 HOUR (50G) W/O FASTING: GLUCOSE 1 HOUR GTT: 104 mg/dL (ref 70–140)

## 2015-08-12 ENCOUNTER — Encounter (HOSPITAL_COMMUNITY): Payer: Self-pay | Admitting: *Deleted

## 2015-08-12 ENCOUNTER — Inpatient Hospital Stay (HOSPITAL_COMMUNITY)
Admission: AD | Admit: 2015-08-12 | Discharge: 2015-08-12 | Disposition: A | Discharge status: Home / Self Care | Payer: Medicaid Other | Source: Ambulatory Visit | Attending: Obstetrics & Gynecology | Admitting: Obstetrics & Gynecology

## 2015-08-12 ENCOUNTER — Ambulatory Visit (HOSPITAL_COMMUNITY): Payer: Medicaid Other

## 2015-08-12 DIAGNOSIS — O368130 Decreased fetal movements, third trimester, not applicable or unspecified: Secondary | ICD-10-CM

## 2015-08-12 DIAGNOSIS — Z87891 Personal history of nicotine dependence: Secondary | ICD-10-CM | POA: Insufficient documentation

## 2015-08-12 DIAGNOSIS — Z3A36 36 weeks gestation of pregnancy: Secondary | ICD-10-CM | POA: Diagnosis not present

## 2015-08-12 DIAGNOSIS — Z3689 Encounter for other specified antenatal screening: Secondary | ICD-10-CM

## 2015-08-12 DIAGNOSIS — R109 Unspecified abdominal pain: Secondary | ICD-10-CM | POA: Diagnosis not present

## 2015-08-12 DIAGNOSIS — S3992XA Unspecified injury of lower back, initial encounter: Secondary | ICD-10-CM | POA: Diagnosis not present

## 2015-08-12 DIAGNOSIS — W19XXXA Unspecified fall, initial encounter: Secondary | ICD-10-CM | POA: Insufficient documentation

## 2015-08-12 DIAGNOSIS — Y92009 Unspecified place in unspecified non-institutional (private) residence as the place of occurrence of the external cause: Secondary | ICD-10-CM

## 2015-08-12 DIAGNOSIS — M549 Dorsalgia, unspecified: Secondary | ICD-10-CM

## 2015-08-12 DIAGNOSIS — M545 Low back pain: Secondary | ICD-10-CM | POA: Insufficient documentation

## 2015-08-12 DIAGNOSIS — O9A213 Injury, poisoning and certain other consequences of external causes complicating pregnancy, third trimester: Secondary | ICD-10-CM

## 2015-08-12 DIAGNOSIS — O99891 Other specified diseases and conditions complicating pregnancy: Secondary | ICD-10-CM

## 2015-08-12 DIAGNOSIS — W1830XA Fall on same level, unspecified, initial encounter: Secondary | ICD-10-CM

## 2015-08-12 DIAGNOSIS — O9989 Other specified diseases and conditions complicating pregnancy, childbirth and the puerperium: Secondary | ICD-10-CM

## 2015-08-12 MED ORDER — OXYCODONE-ACETAMINOPHEN 5-325 MG PO TABS: 1.0000 | ORAL_TABLET | Freq: Four times a day (QID) | ORAL | Status: DC | PRN

## 2015-08-12 NOTE — MAU Provider Note (Signed)
History     CSN: [id number]  Arrival date and time: 08/12/15 1417   First Provider Initiated Contact with Patient 08/12/15 1432      No chief complaint on file.  HPI Ms. Sandra Wong is a 29 y.o. [id number] at 5935w0d who presents to MAU today with complaint of decreased fetal movement since a fall this morning. The patient states that she has had some lower back and abdominal pain since the fall as well. She denies vaginal bleeding, LOF or contractions. [facility] due to h/oPTD at 28 weeks, cause unknown per patient.   OB History    Gravida Para Term Preterm AB TAB SAB Ectopic Multiple Living   5 2 1 1 2 1  0 0 0 2      Obstetric Comments   2015 - attempted magnesium, SROM, rapid delivery, postpartum hemorrhage - cytotec      Past Medical History  Diagnosis Date  . Headache(784.0)   . Anxiety   . Anemia 2009    s/p delivery  . Obesity   . Abortion   . Depression   . GERD (gastroesophageal reflux disease)   . Sleep apnea   . Chronic back pain     Past Surgical History  Procedure Laterality Date  . Dilation and curettage of uterus    . Induced abortion      Family History  Problem Relation Age of Onset  . Hypertension Mother   . Hypertension Paternal Aunt   . Cancer Maternal Grandfather   . Stroke Maternal Grandfather   . Hypertension Father   . Asthma Son   . Kidney disease Paternal Grandmother   . Kidney disease Paternal Grandfather     Social History  Substance Use Topics  . Smoking status: Former Smoker -- 0.25 packs/day for 3 years    Quit date: 06/08/2013  . Smokeless tobacco: Never Used  . Alcohol Use: No     Comment: occasional when not pregnant     Allergies:  Allergies  Allergen Reactions  . Other     Has an allergy to some type of eye drop but does not remember the name (in childhood)    No prescriptions prior to admission    Review of Systems  Constitutional: Negative for fever and malaise/fatigue.   Gastrointestinal: Positive for abdominal pain.  Genitourinary:       Neg - vaginal bleeding, discharge, LOF   Physical Exam   Blood pressure 122/68, pulse 78, resp. rate 16, last menstrual period 12/15/2014, not currently breastfeeding.  Physical Exam  Nursing note and vitals reviewed. Constitutional: She is oriented to person, place, and time. She appears well-developed and well-nourished. No distress.  HENT:  Head: Normocephalic and atraumatic.  Cardiovascular: Normal rate.   Respiratory: Effort normal.  GI: Soft. She exhibits no distension and no mass. There is no tenderness. There is no rebound and no guarding.  Neurological: She is alert and oriented to person, place, and time.  Skin: Skin is warm and dry. No erythema.  Psychiatric: She has a normal mood and affect.   Fetal Monitoring: Baseline: 120 bpm, moderate variability, + accelerations, no decelerations Contractions: none  MAU Course  Procedures None  MDM Patient had appointment cancelled for US today because she had her other child with her.  Discussed patient with Dr. Penne LashLeggett, ok for discharge at this time without extended monitoring due to remote time of injury.  Patient is feeling normal movement while in MAU.  Assessment  and Plan  A: SIUP at [redacted]w[redacted]d Fall at home Abdominal pain in pregnancy  P: Discharge home Rx for Percocet given to patient Ice to affected area recommended Kick counts and Preterm labor precautions discussed Patient advised to follow-up with [facility] as scheduled for routine prenatal care or sooner PRN Patient may return to MAU as needed or if her condition were to change or worsen   Marny Lowenstein, PA-C  08/12/2015, 4:16 PM   No contractions during exam.  No signs of abruption Attestation of Attending Supervision of Advanced Practitioner (CNM/NP): Evaluation and management procedures were performed by the Advanced Practitioner under my supervision and collaboration. I have reviewed the  Advanced Practitioner's note and chart, and I agree with the management and plan.  Lesly Dukes., MD 1:01 PM

## 2015-08-12 NOTE — Discharge Instructions (Signed)
What Do I Need to Know About Injuries During Pregnancy? °Injuries can happen during pregnancy. Minor falls and accidents usually do not harm you or your baby. However, any injury should be reported to your doctor. °WHAT CAN I DO TO PROTECT MYSELF FROM INJURIES? °· Remove rugs and loose objects on the floor. °· Wear comfortable shoes that have a good grip. Do not wear high-heeled shoes. °· Always wear your seat belt. The lap belt should be below your belly. Always practice safe driving. °· Do not ride on a motorcycle. °· Do not participate in high-impact activities or sports. °· Avoid: °¨ Walking on wet or slippery floors. °¨ Fires. °¨ Starting fires. °¨ Lifting heavy pots of boiling or hot liquids. °¨ Fixing electrical problems. °· Only take medicine as told by your doctor. °· Know your blood type and the blood type of the baby's father. °· Call your local emergency services (911 in the [place] if you are a victim of domestic violence or assault. For help and support, contact the National Domestic Violence Hotline. °WHEN SHOULD I GET HELP RIGHT AWAY? °· You fall on your belly or have any high-impact accident or injury. °· You have been a victim of domestic violence or any kind of violence. °· You have been in a car accident. °· You have bleeding from your vagina. °· Fluid is leaking from your vagina. °· You start to have belly cramping (contractions) or pain. °· You feel weak or pass out (faint). °· You start to throw up (vomit) after an injury. °· You have been burned. °· You have a stiff neck or neck pain. °· You get a headache or have vision problems after an injury. °· You do not feel the baby move or the baby is not moving as much as normal. °  °This information is not intended to replace advice given to you by your health care provider. Make sure you discuss any questions you have with your health care provider. °  °Document Released: 10/16/2010 Document Revised: 10/04/2014 Document Reviewed:  06/20/2013 °Elsevier Interactive Patient Education ©2016 Elsevier Inc. ° °

## 2015-08-14 ENCOUNTER — Other Ambulatory Visit (HOSPITAL_COMMUNITY)
Admission: RE | Admit: 2015-08-14 | Discharge: 2015-08-14 | Disposition: A | Discharge status: Home / Self Care | Payer: Medicaid Other | Source: Ambulatory Visit | Attending: Obstetrics & Gynecology | Admitting: Obstetrics & Gynecology

## 2015-08-14 ENCOUNTER — Ambulatory Visit (INDEPENDENT_AMBULATORY_CARE_PROVIDER_SITE_OTHER): Payer: Medicaid Other | Admitting: Obstetrics & Gynecology

## 2015-08-14 VITALS — BP 126/63 | HR 106 | Temp 98.1°F | Wt 300.2 lb

## 2015-08-14 DIAGNOSIS — O368131 Decreased fetal movements, third trimester, fetus 1: Secondary | ICD-10-CM | POA: Diagnosis not present

## 2015-08-14 DIAGNOSIS — Z113 Encounter for screening for infections with a predominantly sexual mode of transmission: Secondary | ICD-10-CM | POA: Diagnosis not present

## 2015-08-14 DIAGNOSIS — O09213 Supervision of pregnancy with history of pre-term labor, third trimester: Secondary | ICD-10-CM

## 2015-08-14 LAB — POCT URINALYSIS DIP (DEVICE)
Bilirubin Urine: NEGATIVE
GLUCOSE, UA: NEGATIVE mg/dL
Hgb urine dipstick: NEGATIVE
Ketones, ur: NEGATIVE mg/dL
Leukocytes, UA: NEGATIVE
NITRITE: NEGATIVE
PH: 7 (ref 5.0–8.0)
PROTEIN: NEGATIVE mg/dL
Specific Gravity, Urine: 1.025 (ref 1.005–1.030)
Urobilinogen, UA: 0.2 mg/dL (ref 0.0–1.0)

## 2015-08-14 LAB — OB RESULTS CONSOLE GBS: STREP GROUP B AG: NEGATIVE

## 2015-08-14 NOTE — Progress Notes (Signed)
Subjective:  Sandra Wong is a 29 y.o. [id number] at 7461w2d being seen today for ongoing prenatal care.  Movement: (!) Decreased.  She states that she fell 2 days ago. She fell backward after trying to avoid knocking over her son. She became worried  because she could not feel baby moving as much and went to the MAU. They did see fetal movement in the MAU. Extended fetal monitoring was not deemed necessary given remote time of injury. She says that she has felt the fetus ball up inside her from time to time since the fall but she has not felt kicking.  She denies any bleeding, or fluid leakage since the fall. She denies headache, blurry vision, or spots in her vision.  Patient reports mild pelvic pain relieved by Flexeril.  Contractions: Not present.  She has been having some intermittent mild shortness of breath when lying on her side, which has resolved. She denies, N/V, balance issues, chest pain, extremity pain, dysuria, vaginal itching/ irritation, or rashes.   The following portions of the patient's history were reviewed and updated as appropriate: allergies, current medications, past family history, past medical history, past social history, past surgical history and problem list. Problem list updated.  Objective:   Filed Vitals:   08/14/15 0847  BP: 126/63  Pulse: 106  Temp: 98.1 F (36.7 C)  Weight: 136.17 kg (300 lb 3.2 oz)    Fetal Status: Fetal Heart Rate (bpm): 145   Movement: (!) Decreased     General:  Alert, oriented and cooperative. Patient is in no acute distress.  Skin: Skin is warm and dry. No rash noted.   Cardiovascular: Normal heart rate noted  Respiratory: Normal respiratory effort, no problems with respiration noted  Abdomen: Soft, gravid, appropriate for gestational age. Pain/Pressure: Present     Pelvic: Vag. Bleeding: None     Bimanual exam showed cephalic position of fetus and 1cm dilation.   Extremities: Normal range of motion.  Edema: None  Mental Status:  Normal mood and affect. Normal behavior. Normal judgment and thought content.   Urinalysis: Urine Protein: Negative Urine Glucose: Negative  Assessment and Plan:  Pregnancy: [id number] at 2161w2d  1. Previous preterm delivery, antepartum, third trimester - GC/Chlamydia Probe Amp - Culture, beta strep (group b only); Standing - Culture, beta strep (group b only)  2. Decreased fetal movement:  - NST: showed category 1 heart tracing. Baseline 140bmp, moderate variability, accelerations no decelerations.   -follow-up  Future Appointments Date Time Provider Department Center  08/18/2015 12:45 PM [facility] US 5 [facility]  08/25/2015 9:45 AM Lesly DukesKelly H Leggett, MD [facility]   Preterm labor symptoms and general obstetric precautions including but not limited to vaginal bleeding, contractions, leaking of fluid and fetal movement were reviewed in detail with the patient. Please refer to After Visit Summary for other counseling recommendations.  Return in about 1 week (around 08/21/2015).   Janett LabellaJohannes L Du Pisanie, Med Student

## 2015-08-14 NOTE — Progress Notes (Signed)
Pt fell on 08/12/15 pt reports less movement from baby since then.

## 2015-08-14 NOTE — Patient Instructions (Signed)
Vaginal Delivery °During delivery, your health care provider will help you give birth to your baby. During a vaginal delivery, you will work to push the baby out of your vagina. However, before you can push your baby out, a few things need to happen. The opening of your uterus (cervix) has to soften, thin out, and open up (dilate) all the way to 10 cm. Also, your baby has to move down from the uterus into your vagina.  °SIGNS OF LABOR  °Your health care provider will first need to make sure you are in labor. Signs of labor include:  °· Passing what is called the mucous plug before labor begins. This is a small amount of blood-stained mucus. °· Having regular, painful uterine contractions.   °· The time between contractions gets shorter.   °· The discomfort and pain gradually get more intense. °· Contraction pains get worse when walking and do not go away when resting.   °· Your cervix becomes thinner (effacement) and dilates. °BEFORE THE DELIVERY °Once you are in labor and admitted into the hospital or care center, your health care provider may do the following:  °· Perform a complete physical exam. °· Review any complications related to pregnancy or labor.  °· Check your blood pressure, pulse, temperature, and heart rate (vital signs).   °· Determine if, and when, the rupture of amniotic membranes occurred. °· Do a vaginal exam (using a sterile glove and lubricant) to determine:   °¨ The position (presentation) of the baby. Is the baby's head presenting first (vertex) in the birth canal (vagina), or are the feet or buttocks first (breech)?   °¨ The level (station) of the baby's head within the birth canal.   °¨ The effacement and dilatation of the cervix.   °· An electronic fetal monitor is usually placed on your abdomen when you first arrive. This is used to monitor your contractions and the baby's heart rate. °¨ When the monitor is on your abdomen (external fetal monitor), it can only pick up the frequency and  length of your contractions. It cannot tell the strength of your contractions. °¨ If it becomes necessary for your health care provider to know exactly how strong your contractions are or to see exactly what the baby's heart rate is doing, an internal monitor may be inserted into your vagina and uterus. Your health care provider will discuss the benefits and risks of using an internal monitor and obtain your permission before inserting the device. °¨ Continuous fetal monitoring may be needed if you have an epidural, are receiving certain medicines (such as oxytocin), or have pregnancy or labor complications. °· An IV access tube may be placed into a vein in your arm to deliver fluids and medicines if necessary. °THREE STAGES OF LABOR AND DELIVERY °Normal labor and delivery is divided into three stages. °First Stage °This stage starts when you begin to contract regularly and your cervix begins to efface and dilate. It ends when your cervix is completely open (fully dilated). The first stage is the longest stage of labor and can last from 3 hours to 15 hours.  °Several methods are available to help with labor pain. You and your health care provider will decide which option is best for you. Options include:  °· Opioid medicines. These are strong pain medicines that you can get through your IV tube or as a shot into your muscle. These medicines lessen pain but do not make it go away completely.  °· Epidural. A medicine is given through a thin tube that   is inserted in your back. The medicine numbs the lower part of your body and prevents any pain in that area. °· Paracervical pain medicine. This is an injection of an anesthetic on each side of your cervix.   °· You may request natural childbirth, which does not involve the use of pain medicines or an epidural during labor and delivery. Instead, you will use other things, such as breathing exercises, to help cope with the pain. °Second Stage °The second stage of labor  begins when your cervix is fully dilated at 10 cm. It continues until you push your baby down through the birth canal and the baby is born. This stage can take only minutes or several hours. °· The location of your baby's head as it moves through the birth canal is reported as a number called a station. If the baby's head has not started its descent, the station is described as being at minus 3 (-3). When your baby's head is at the zero station, it is at the middle of the birth canal and is engaged in the pelvis. The station of your baby helps indicate the progress of the second stage of labor. °· When your baby is born, your health care provider may hold the baby with his or her head lowered to prevent amniotic fluid, mucus, and blood from getting into the baby's lungs. The baby's mouth and nose may be suctioned with a small bulb syringe to remove any additional fluid. °· Your health care provider may then place the baby on your stomach. It is important to keep the baby from getting cold. To do this, the health care provider will dry the baby off, place the baby directly on your skin (with no blankets between you and the baby), and cover the baby with warm, dry blankets.   °· The umbilical cord is cut. °Third Stage °During the third stage of labor, your health care provider will deliver the placenta (afterbirth) and make sure your bleeding is under control. The delivery of the placenta usually takes about 5 minutes but can take up to 30 minutes. After the placenta is delivered, a medicine may be given either by IV or injection to help contract the uterus and control bleeding. If you are planning to breastfeed, you can try to do so now. °After you deliver the placenta, your uterus should contract and get very firm. If your uterus does not remain firm, your health care provider will massage it. This is important because the contraction of the uterus helps cut off bleeding at the site where the placenta was attached  to your uterus. If your uterus does not contract properly and stay firm, you may continue to bleed heavily. If there is a lot of bleeding, medicines may be given to contract the uterus and stop the bleeding.  °  °This information is not intended to replace advice given to you by your health care provider. Make sure you discuss any questions you have with your health care provider. °  °Document Released: 06/22/2008 Document Revised: 10/04/2014 Document Reviewed: 05/10/2012 °Elsevier Interactive Patient Education ©2016 Elsevier Inc. ° °

## 2015-08-15 LAB — GC/CHLAMYDIA PROBE AMP (~~LOC~~) NOT AT ARMC
Chlamydia: NEGATIVE
Neisseria Gonorrhea: NEGATIVE

## 2015-08-16 LAB — CULTURE, BETA STREP (GROUP B ONLY)

## 2015-08-18 ENCOUNTER — Ambulatory Visit (HOSPITAL_COMMUNITY): Admission: RE | Admit: 2015-08-18 | Payer: Medicaid Other | Source: Ambulatory Visit

## 2015-08-19 ENCOUNTER — Encounter: Payer: Self-pay | Admitting: *Deleted

## 2015-08-19 NOTE — Progress Notes (Signed)
Patient was scheduled for U/S on 08/18/15.  Patient didn't keep this appointment.  Showed up in [facility] today 08/19/15 stating she had spoken with someone there who told her to come in today 08/19/15 at 12:30 pm.  Manager, Evelina DunHelen Sullivan, called to make me aware of situation.  States [facility] U/S doesn't have 12:30 pm appointments.  States they explained this to patient and offered her an appointment for Tuesday.  Patient left without being rescheduled.

## 2015-08-25 ENCOUNTER — Ambulatory Visit (INDEPENDENT_AMBULATORY_CARE_PROVIDER_SITE_OTHER): Payer: Medicaid Other | Admitting: Obstetrics & Gynecology

## 2015-08-25 VITALS — BP 106/64 | HR 117 | Temp 98.0°F | Wt 299.5 lb

## 2015-08-25 DIAGNOSIS — O09213 Supervision of pregnancy with history of pre-term labor, third trimester: Secondary | ICD-10-CM

## 2015-08-25 DIAGNOSIS — O0993 Supervision of high risk pregnancy, unspecified, third trimester: Secondary | ICD-10-CM

## 2015-08-25 DIAGNOSIS — F329 Major depressive disorder, single episode, unspecified: Secondary | ICD-10-CM

## 2015-08-25 DIAGNOSIS — O99343 Other mental disorders complicating pregnancy, third trimester: Secondary | ICD-10-CM

## 2015-08-25 LAB — POCT URINALYSIS DIP (DEVICE)
BILIRUBIN URINE: NEGATIVE
GLUCOSE, UA: NEGATIVE mg/dL
HGB URINE DIPSTICK: NEGATIVE
NITRITE: NEGATIVE
Protein, ur: 30 mg/dL — AB
Specific Gravity, Urine: >=1.03 (ref 1.005–1.030)
UROBILINOGEN UA: 0.2 mg/dL (ref 0.0–1.0)
pH: 6 (ref 5.0–8.0)

## 2015-08-25 NOTE — Progress Notes (Signed)
Pt reports having frequent nosebleeds. Baby is moving but is less active than he used to be.  Trace ketones and trace leukocytes in urine.

## 2015-08-25 NOTE — Progress Notes (Signed)
Subjective:  Sandra Wong is a 29 y.o. [id number] at 4570w6d being seen today for ongoing prenatal care.  She is currently monitored for the following issues for this high-risk pregnancy and has Migraine with aura; Anxiety; Depression; Anemia affecting pregnancy; Previous preterm delivery, antepartum; Hx of postpartum hemorrhage, currently pregnant; Morbid obesity (HCC); Obesity affecting pregnancy; Supervision of high-risk pregnancy; and Depression affecting pregnancy in third trimester, antepartum on her problem list.  Patient reports no complaints.  Contractions: Not present. Vag. Bleeding: None.  Movement: (!) Decreased. Denies leaking of fluid.   The following portions of the patient's history were reviewed and updated as appropriate: allergies, current medications, past family history, past medical history, past social history, past surgical history and problem list. Problem list updated.  Objective:   Filed Vitals:   08/25/15 1026  BP: 106/64  Pulse: 117  Temp: 98 F (36.7 C)  Weight: 299 lb 8 oz (135.852 kg)    Fetal Status: Fetal Heart Rate (bpm): 145 Fundal Height: 40 cm Movement: (!) Decreased  Presentation: Vertex  General:  Alert, oriented and cooperative. Patient is in no acute distress.  Skin: Skin is warm and dry. No rash noted.   Cardiovascular: Normal heart rate noted  Respiratory: Normal respiratory effort, no problems with respiration noted  Abdomen: Soft, gravid, appropriate for gestational age. Pain/Pressure: Present     Pelvic: Vag. Bleeding: None     Cervical exam performed Dilation: Fingertip Effacement (%): 50 Station: -3  Extremities: Normal range of motion.  Edema: None  Mental Status: Normal mood and affect. Normal behavior. Normal judgment and thought content.   Urinalysis: Urine Protein: 1+ Urine Glucose: Negative  Assessment and Plan:  Pregnancy: A5W0981G5P1122 at 4670w6d  1. Supervision of high-risk pregnancy, third trimester -patient left before NST done  for decreased fetal movement.  RN from clinic will call patient and have her come in.    Term labor symptoms and general obstetric precautions including but not limited to vaginal bleeding, contractions, leaking of fluid and fetal movement were reviewed in detail with the patient. Please refer to After Visit Summary for other counseling recommendations.  Return in about 1 day (around 08/26/2015).   Lesly DukesKelly H Juanette Urizar, MD

## 2015-08-26 ENCOUNTER — Ambulatory Visit (INDEPENDENT_AMBULATORY_CARE_PROVIDER_SITE_OTHER): Payer: Medicaid Other | Admitting: *Deleted

## 2015-08-26 ENCOUNTER — Other Ambulatory Visit (HOSPITAL_COMMUNITY): Payer: Self-pay | Admitting: Family Medicine

## 2015-08-26 ENCOUNTER — Ambulatory Visit (HOSPITAL_COMMUNITY)
Admission: RE | Admit: 2015-08-26 | Discharge: 2015-08-26 | Disposition: A | Discharge status: Home / Self Care | Payer: Medicaid Other | Source: Ambulatory Visit | Attending: Family Medicine | Admitting: Family Medicine

## 2015-08-26 ENCOUNTER — Telehealth: Payer: Self-pay | Admitting: *Deleted

## 2015-08-26 VITALS — BP 105/68 | HR 112

## 2015-08-26 DIAGNOSIS — Z3A38 38 weeks gestation of pregnancy: Secondary | ICD-10-CM | POA: Diagnosis not present

## 2015-08-26 DIAGNOSIS — O368131 Decreased fetal movements, third trimester, fetus 1: Secondary | ICD-10-CM

## 2015-08-26 DIAGNOSIS — O368130 Decreased fetal movements, third trimester, not applicable or unspecified: Secondary | ICD-10-CM

## 2015-08-26 DIAGNOSIS — O0933 Supervision of pregnancy with insufficient antenatal care, third trimester: Secondary | ICD-10-CM | POA: Insufficient documentation

## 2015-08-26 DIAGNOSIS — O0993 Supervision of high risk pregnancy, unspecified, third trimester: Secondary | ICD-10-CM

## 2015-08-26 DIAGNOSIS — Z36 Encounter for antenatal screening of mother: Secondary | ICD-10-CM | POA: Insufficient documentation

## 2015-08-26 DIAGNOSIS — O09293 Supervision of pregnancy with other poor reproductive or obstetric history, third trimester: Secondary | ICD-10-CM

## 2015-08-26 DIAGNOSIS — Z1389 Encounter for screening for other disorder: Secondary | ICD-10-CM

## 2015-08-26 NOTE — Telephone Encounter (Signed)
Called pt and advised her that Dr. Penne LashLeggett would like her to have Non-Stress test today due to her report of decreased fetal movement. Pt voiced understanding and agreed to appt today @ 1000.

## 2015-08-26 NOTE — Progress Notes (Signed)
NST today due to pt report of decreased FM.  Pt felt good FM during NST.

## 2015-09-01 ENCOUNTER — Ambulatory Visit (INDEPENDENT_AMBULATORY_CARE_PROVIDER_SITE_OTHER): Payer: Medicaid Other | Admitting: Obstetrics and Gynecology

## 2015-09-01 VITALS — BP 93/74 | HR 90 | Temp 98.5°F | Wt 302.6 lb

## 2015-09-01 DIAGNOSIS — F329 Major depressive disorder, single episode, unspecified: Secondary | ICD-10-CM

## 2015-09-01 DIAGNOSIS — D649 Anemia, unspecified: Secondary | ICD-10-CM

## 2015-09-01 DIAGNOSIS — O99213 Obesity complicating pregnancy, third trimester: Secondary | ICD-10-CM | POA: Diagnosis not present

## 2015-09-01 DIAGNOSIS — O99013 Anemia complicating pregnancy, third trimester: Secondary | ICD-10-CM

## 2015-09-01 DIAGNOSIS — O99343 Other mental disorders complicating pregnancy, third trimester: Secondary | ICD-10-CM

## 2015-09-01 DIAGNOSIS — O0990 Supervision of high risk pregnancy, unspecified, unspecified trimester: Secondary | ICD-10-CM

## 2015-09-01 LAB — POCT URINALYSIS DIP (DEVICE)
BILIRUBIN URINE: NEGATIVE
Glucose, UA: NEGATIVE mg/dL
HGB URINE DIPSTICK: NEGATIVE
Ketones, ur: NEGATIVE mg/dL
LEUKOCYTES UA: NEGATIVE
NITRITE: NEGATIVE
PH: 6 (ref 5.0–8.0)
Protein, ur: 30 mg/dL — AB
Urobilinogen, UA: 0.2 mg/dL (ref 0.0–1.0)

## 2015-09-01 NOTE — Progress Notes (Signed)
Subjective:  Sandra Wong is a 29 y.o. [id number] at 8244w6d being seen today for ongoing prenatal care.  She is currently monitored for the following issues for this high-risk pregnancy and has Migraine with aura; Anxiety; Depression; Anemia affecting pregnancy; Previous preterm delivery, antepartum; Hx of postpartum hemorrhage, currently pregnant; Morbid obesity (HCC); and Supervision of high-risk pregnancy on her problem list.  Patient reports no complaints.  Contractions: Not present. Vag. Bleeding: None.  Movement: Present. Denies leaking of fluid.   The following portions of the patient's history were reviewed and updated as appropriate: allergies, current medications, past family history, past medical history, past social history, past surgical history and problem list. Problem list updated.  Objective:   Filed Vitals:   09/01/15 0859  BP: 93/74  Pulse: 90  Temp: 98.5 F (36.9 C)  Weight: 302 lb 9.6 oz (137.258 kg)    Fetal Status: Fetal Heart Rate (bpm): 144   Movement: Present  Presentation: Vertex  General:  Alert, oriented and cooperative. Patient is in no acute distress.  Skin: Skin is warm and dry. No rash noted.   Cardiovascular: Normal heart rate noted  Respiratory: Normal respiratory effort, no problems with respiration noted  Abdomen: Soft, gravid, appropriate for gestational age. Pain/Pressure: Present     Pelvic: Vag. Bleeding: None Vag D/C Character: Watery   Cervical exam performed Dilation: 1 Effacement (%): 50 Station: -3  Extremities: Normal range of motion.  Edema: Trace  Mental Status: Normal mood and affect. Normal behavior. Normal judgment and thought content.   Urinalysis: Urine Protein: 1+ Urine Glucose: Negative  Assessment and Plan:  Pregnancy: [id number] at 3644w6d  1. Supervision of high-risk pregnancy, unspecified trimester - gbs negative, g/c negative - membranes sweeped today  Term labor symptoms and general obstetric precautions including but  not limited to vaginal bleeding, contractions, leaking of fluid and fetal movement were reviewed in detail with the patient. Please refer to After Visit Summary for other counseling recommendations.  F/u 1 week  Kathrynn RunningNoah Bedford Wouk, MD

## 2015-09-01 NOTE — Progress Notes (Signed)
Breastfeeding tip of the week reviewed Patient stated she is "constantly moist" for the last 2 weeks

## 2015-09-07 ENCOUNTER — Encounter (HOSPITAL_COMMUNITY): Payer: Self-pay | Admitting: *Deleted

## 2015-09-07 ENCOUNTER — Inpatient Hospital Stay (HOSPITAL_COMMUNITY)
Admission: AD | Admit: 2015-09-07 | Discharge: 2015-09-07 | Disposition: A | Discharge status: Home / Self Care | Payer: Medicaid Other | Source: Ambulatory Visit | Attending: Obstetrics and Gynecology | Admitting: Obstetrics and Gynecology

## 2015-09-07 DIAGNOSIS — O0990 Supervision of high risk pregnancy, unspecified, unspecified trimester: Secondary | ICD-10-CM

## 2015-09-07 DIAGNOSIS — Z87891 Personal history of nicotine dependence: Secondary | ICD-10-CM | POA: Insufficient documentation

## 2015-09-07 DIAGNOSIS — Z888 Allergy status to other drugs, medicaments and biological substances status: Secondary | ICD-10-CM | POA: Diagnosis not present

## 2015-09-07 DIAGNOSIS — O09293 Supervision of pregnancy with other poor reproductive or obstetric history, third trimester: Secondary | ICD-10-CM

## 2015-09-07 DIAGNOSIS — Z3A39 39 weeks gestation of pregnancy: Secondary | ICD-10-CM | POA: Insufficient documentation

## 2015-09-07 DIAGNOSIS — O368130 Decreased fetal movements, third trimester, not applicable or unspecified: Secondary | ICD-10-CM | POA: Insufficient documentation

## 2015-09-07 NOTE — MAU Provider Note (Signed)
History     CSN: [id number]  Arrival date and time: 09/07/15 0055   None     Chief Complaint  Patient presents with  . Decreased Fetal Movement   HPI Sandra Wong is 29 y.o. 6471w5d presented for decreased movement. Patient states that baby has only kicked once early this morning.  Patient states that since then she has been having painful contractions. Patient did note that she has had flexeril x 1 Friday night. Patient denies LOF or Vaginal bleeding.   OB History    Gravida Para Term Preterm AB TAB SAB Ectopic Multiple Living   5 2 1 1 2 1  0 0 0 2      Obstetric Comments   2015 - attempted magnesium, SROM, rapid delivery, postpartum hemorrhage - cytotec      Past Medical History  Diagnosis Date  . Headache(784.0)   . Anxiety   . Anemia 2009    s/p delivery  . Obesity   . Abortion   . Depression   . GERD (gastroesophageal reflux disease)   . Sleep apnea   . Chronic back pain     Past Surgical History  Procedure Laterality Date  . Dilation and curettage of uterus    . Induced abortion      Family History  Problem Relation Age of Onset  . Hypertension Mother   . Hypertension Paternal Aunt   . Cancer Maternal Grandfather   . Stroke Maternal Grandfather   . Hypertension Father   . Asthma Son   . Kidney disease Paternal Grandmother   . Kidney disease Paternal Grandfather     Social History  Substance Use Topics  . Smoking status: Former Smoker -- 0.25 packs/day for 3 years    Quit date: 06/08/2013  . Smokeless tobacco: Never Used  . Alcohol Use: No     Comment: occasional when not pregnant     Allergies:  Allergies  Allergen Reactions  . Other     Has an allergy to some type of eye drop but does not remember the name (in childhood)    Prescriptions prior to admission  Medication Sig Dispense Refill Last Dose  . cyclobenzaprine (FLEXERIL) 10 MG tablet Take 10 mg by mouth 3 (three) times daily as needed for muscle spasms.   Past Week at  Unknown time  . acetaminophen (TYLENOL) 325 MG tablet Take 650 mg by mouth every 6 (six) hours as needed for mild pain, moderate pain or headache.    Not Taking  . oxyCODONE-acetaminophen (PERCOCET/ROXICET) 5-325 MG tablet Take 1 tablet by mouth every 6 (six) hours as needed for severe pain. (Patient not taking: Reported on 08/14/2015) 8 tablet 0 Not Taking  . Prenatal Vit-Fe Fumarate-FA (PRENATAL VITAMINS PLUS) 27-1 MG TABS Take 1 tablet by mouth daily. (Patient not taking: Reported on 08/25/2015) 30 tablet 6 Not Taking  . sertraline (ZOLOFT) 50 MG tablet Take 1 tablet (50 mg total) by mouth daily. (Patient not taking: Reported on 08/12/2015) 30 tablet 0 Not Taking    Review of Systems  Psychiatric/Behavioral: Positive for memory loss.   Physical Exam   Blood pressure 128/72, pulse 97, resp. rate 18, height 5\' 9"  (1.753 m), weight 308 lb 3.2 oz (139.799 kg), last menstrual period 12/15/2014, not currently breastfeeding.  Physical Exam  Cardiovascular: Normal rate, regular rhythm and normal heart sounds.  Exam reveals no friction rub.   No murmur heard. Respiratory: Effort normal and breath sounds normal. No respiratory distress. She has no wheezes.  FHT:  FHR: 140 bpm, variability: moderate,  accelerations: present,  decelerations:  None  Toco: every 8 minutes  Dilation: Fingertip Presentation: Vertex Exam by:: Dr Mikill/ Joellyn Haff CNM  MAU Course  Procedures   Assessment and Plan  Labor check + Decreased fetal movement  - Patient fetal heart tracing reactive on monitor with accelerations, cervix not dilated, irregular contractions    Sandra Wong 09/07/2015, 2:11 AM   Decreased fm today, irregular uc's, denies lof/vb.  Reactive NST/Cat I, irregular uc's, SVE very posterior ft-1/thick.  Reviewed labor s/s, fkc, reasons to return.  Cheral Marker, CNM, [facility] 09/07/2015 6:08 AM

## 2015-09-07 NOTE — Progress Notes (Signed)
Sandra HaffKim Booker CNM in to discuss d/c plan. Written and verbal d/c instructions given and understanding voiced

## 2015-09-07 NOTE — Discharge Instructions (Signed)
Braxton Hicks Contractions °Contractions of the uterus can occur throughout pregnancy. Contractions are not always a sign that you are in labor.  °WHAT ARE BRAXTON HICKS CONTRACTIONS?  °Contractions that occur before labor are called Braxton Hicks contractions, or false labor. Toward the end of pregnancy (32-34 weeks), these contractions can develop more often and may become more forceful. This is not true labor because these contractions do not result in opening (dilatation) and thinning of the cervix. They are sometimes difficult to tell apart from true labor because these contractions can be forceful and people have different pain tolerances. You should not feel embarrassed if you go to the hospital with false labor. Sometimes, the only way to tell if you are in true labor is for your health care provider to look for changes in the cervix. °If there are no prenatal problems or other health problems associated with the pregnancy, it is completely safe to be sent home with false labor and await the onset of true labor. °HOW CAN YOU TELL THE DIFFERENCE BETWEEN TRUE AND FALSE LABOR? °False Labor °· The contractions of false labor are usually shorter and not as hard as those of true labor.   °· The contractions are usually irregular.   °· The contractions are often felt in the front of the lower abdomen and in the groin.   °· The contractions may go away when you walk around or change positions while lying down.   °· The contractions get weaker and are shorter lasting as time goes on.   °· The contractions do not usually become progressively stronger, regular, and closer together as with true labor.   °True Labor °· Contractions in true labor last 30-70 seconds, become very regular, usually become more intense, and increase in frequency.   °· The contractions do not go away with walking.   °· The discomfort is usually felt in the top of the uterus and spreads to the lower abdomen and low back.   °· True labor can be  determined by your health care provider with an exam. This will show that the cervix is dilating and getting thinner.   °WHAT TO REMEMBER °· Keep up with your usual exercises and follow other instructions given by your health care provider.   °· Take medicines as directed by your health care provider.   °· Keep your regular prenatal appointments.   °· Eat and drink lightly if you think you are going into labor.   °· If Braxton Hicks contractions are making you uncomfortable:   °¨ Change your position from lying down or resting to walking, or from walking to resting.   °¨ Sit and rest in a tub of warm water.   °¨ Drink 2-3 glasses of water. Dehydration may cause these contractions.   °¨ Do slow and deep breathing several times an hour.   °WHEN SHOULD I SEEK IMMEDIATE MEDICAL CARE? °Seek immediate medical care if: °· Your contractions become stronger, more regular, and closer together.   °· You have fluid leaking or gushing from your vagina.   °· You have a fever.   °· You pass blood-tinged mucus.   °· You have vaginal bleeding.   °· You have continuous abdominal pain.   °· You have low back pain that you never had before.   °· You feel your baby's head pushing down and causing pelvic pressure.   °· Your baby is not moving as much as it used to.   °  °This information is not intended to replace advice given to you by your health care provider. Make sure you discuss any questions you have with your health care   provider. °  °Document Released: 09/13/2005 Document Revised: 09/18/2013 Document Reviewed: 06/25/2013 °Elsevier Interactive Patient Education ©2016 Else[employer] ° °

## 2015-09-07 NOTE — MAU Note (Signed)
Took muscle relaxer Friday night. Felt baby move one time Saturday. Denies LOF or bleeding.States stomach tightens sometimes

## 2015-09-07 NOTE — Progress Notes (Signed)
Dr Teena DunkMikill

## 2015-09-07 NOTE — Progress Notes (Signed)
Dr Teena DunkMikill on unit and aware of pt's admission and status. EFM strip reviewed.

## 2015-09-08 ENCOUNTER — Ambulatory Visit (INDEPENDENT_AMBULATORY_CARE_PROVIDER_SITE_OTHER): Payer: Medicaid Other | Admitting: Family Medicine

## 2015-09-08 VITALS — BP 110/65 | HR 101 | Temp 98.3°F | Wt 303.3 lb

## 2015-09-08 DIAGNOSIS — O99019 Anemia complicating pregnancy, unspecified trimester: Secondary | ICD-10-CM

## 2015-09-08 DIAGNOSIS — F329 Major depressive disorder, single episode, unspecified: Secondary | ICD-10-CM

## 2015-09-08 DIAGNOSIS — O99213 Obesity complicating pregnancy, third trimester: Secondary | ICD-10-CM

## 2015-09-08 DIAGNOSIS — O09213 Supervision of pregnancy with history of pre-term labor, third trimester: Secondary | ICD-10-CM

## 2015-09-08 DIAGNOSIS — O0990 Supervision of high risk pregnancy, unspecified, unspecified trimester: Secondary | ICD-10-CM

## 2015-09-08 DIAGNOSIS — O99343 Other mental disorders complicating pregnancy, third trimester: Secondary | ICD-10-CM

## 2015-09-08 DIAGNOSIS — D649 Anemia, unspecified: Secondary | ICD-10-CM

## 2015-09-08 DIAGNOSIS — O99013 Anemia complicating pregnancy, third trimester: Secondary | ICD-10-CM

## 2015-09-08 LAB — POCT URINALYSIS DIP (DEVICE)
GLUCOSE, UA: NEGATIVE mg/dL
HGB URINE DIPSTICK: NEGATIVE
KETONES UR: NEGATIVE mg/dL
LEUKOCYTES UA: NEGATIVE
Nitrite: NEGATIVE
Protein, ur: 100 mg/dL — AB
Urobilinogen, UA: 0.2 mg/dL (ref 0.0–1.0)
pH: 6 (ref 5.0–8.0)

## 2015-09-08 NOTE — Progress Notes (Signed)
Breastfeeding tip of the week reviewed. 

## 2015-09-08 NOTE — Patient Instructions (Addendum)
Evening primrose oil 1g TID and 2 g at night in your vagina  Your induction is scheduled 12/21 at 0730. Please come to L&D at that time  Labor Induction Labor induction is when steps are taken to cause a pregnant woman to begin the labor process. Most women go into labor on their own between 37 weeks and 42 weeks of the pregnancy. When this does not happen or when there is a medical need, methods may be used to induce labor. Labor induction causes a pregnant woman's uterus to contract. It also causes the cervix to soften (ripen), open (dilate), and thin out (efface). Usually, labor is not induced before 39 weeks of the pregnancy unless there is a problem with the baby or mother.  Before inducing labor, your health care provider will consider a number of factors, including the following:  The medical condition of you and the baby.   How many weeks along you are.   The status of the baby's lung maturity.   The condition of the cervix.   The position of the baby.  WHAT ARE THE REASONS FOR LABOR INDUCTION? Labor may be induced for the following reasons:  The health of the baby or mother is at risk.   The pregnancy is overdue by 1 week or more.   The water breaks but labor does not start on its own.   The mother has a health condition or serious illness, such as high blood pressure, infection, placental abruption, or diabetes.  The amniotic fluid amounts are low around the baby.   The baby is distressed.  Convenience or wanting the baby to be born on a certain date is not a reason for inducing labor. WHAT METHODS ARE USED FOR LABOR INDUCTION? Several methods of labor induction may be used, such as:   Prostaglandin medicine. This medicine causes the cervix to dilate and ripen. The medicine will also start contractions. It can be taken by mouth or by inserting a suppository into the vagina.   Inserting a thin tube (catheter) with a balloon on the end into the vagina to dilate  the cervix. Once inserted, the balloon is expanded with water, which causes the cervix to open.   Stripping the membranes. Your health care provider separates amniotic sac tissue from the cervix, causing the cervix to be stretched and causing the release of a hormone called progesterone. This may cause the uterus to contract. It is often done during an office visit. You will be sent home to wait for the contractions to begin. You will then come in for an induction.   Breaking the water. Your health care provider makes a hole in the amniotic sac using a small instrument. Once the amniotic sac breaks, contractions should begin. This may still take hours to see an effect.   Medicine to trigger or strengthen contractions. This medicine is given through an IV access tube inserted into a vein in your arm.  All of the methods of induction, besides stripping the membranes, will be done in the hospital. Induction is done in the hospital so that you and the baby can be carefully monitored.  HOW LONG DOES IT TAKE FOR LABOR TO BE INDUCED? Some inductions can take up to 2-3 days. Depending on the cervix, it usually takes less time. It takes longer when you are induced early in the pregnancy or if this is your first pregnancy. If a mother is still pregnant and the induction has been going on for 2-3 days,  either the mother will be sent home or a cesarean delivery will be needed. WHAT ARE THE RISKS ASSOCIATED WITH LABOR INDUCTION? Some of the risks of induction include:   Changes in fetal heart rate, such as too high, too low, or erratic.   Fetal distress.   Chance of infection for the mother and baby.   Increased chance of having a cesarean delivery.   Breaking off (abruption) of the placenta from the uterus (rare).   Uterine rupture (very rare).  When induction is needed for medical reasons, the benefits of induction may outweigh the risks. WHAT ARE SOME REASONS FOR NOT INDUCING LABOR? Labor  induction should not be done if:   It is shown that your baby does not tolerate labor.   You have had previous surgeries on your uterus, such as a myomectomy or the removal of fibroids.   Your placenta lies very low in the uterus and blocks the opening of the cervix (placenta previa).   Your baby is not in a head-down position.   The umbilical cord drops down into the birth canal in front of the baby. This could cut off the baby's blood and oxygen supply.   You have had a previous cesarean delivery.   There are unusual circumstances, such as the baby being extremely premature.    This information is not intended to replace advice given to you by your health care provider. Make sure you discuss any questions you have with your health care provider.   Document Released: 02/02/2007 Document Revised: 10/04/2014 Document Reviewed: 04/12/2013 Elsevier Interactive Patient Education [employer]

## 2015-09-08 NOTE — Progress Notes (Signed)
Subjective:  Sandra Wong is a 29 y.o. [id number] at 7232w6d being seen today for ongoing prenatal care.  She is currently monitored for the following issues for this low-risk pregnancy and has Migraine with aura; Anxiety; Depression; Anemia affecting pregnancy; Previous preterm delivery, antepartum; Hx of postpartum hemorrhage, currently pregnant; Morbid obesity (HCC); and Supervision of high-risk pregnancy on her problem list.  Patient reports no complaints.  Contractions: Irritability. Vag. Bleeding: None.  Movement: Present. Denies leaking of fluid.   The following portions of the patient's history were reviewed and updated as appropriate: allergies, current medications, past family history, past medical history, past social history, past surgical history and problem list. Problem list updated.  Objective:   Filed Vitals:   09/08/15 1145  BP: 110/65  Pulse: 101  Temp: 98.3 F (36.8 C)  Weight: 303 lb 4.8 oz (137.576 kg)    Fetal Status: Fetal Heart Rate (bpm): 139 Fundal Height: 42 cm Movement: Present  Presentation: Vertex  General:  Alert, oriented and cooperative. Patient is in no acute distress.  Skin: Skin is warm and dry. No rash noted.   Cardiovascular: Normal heart rate noted  Respiratory: Normal respiratory effort, no problems with respiration noted  Abdomen: Soft, gravid, appropriate for gestational age. Pain/Pressure: Present     Pelvic: Vag. Bleeding: None     Cervical exam performed Dilation: 1.5 Effacement (%): 50 Station: -3  Extremities: Normal range of motion.  Edema: None  Mental Status: Normal mood and affect. Normal behavior. Normal judgment and thought content.   Urinalysis: Urine Protein: 2+ Urine Glucose: Negative  Assessment and Plan:  Pregnancy: [id number] at 2832w6d  1. Supervision of high-risk pregnancy, unspecified trimester - swept membranes today in clinic - scheduled IOL for 12/21 at 0730 AM, unable to get 12/20 - GBS neg  2. Previous preterm  delivery, antepartum, third trimester 3. Morbid obesity due to excess calories (HCC) 4. Anemia affecting pregnancy Lab Results  Component Value Date   HGB 10.6* 03/17/2015     Term labor symptoms and general obstetric precautions including but not limited to vaginal bleeding, contractions, leaking of fluid and fetal movement were reviewed in detail with the patient. Please refer to After Visit Summary for other counseling recommendations.  Return in about 1 week (around 09/15/2015) for Routine prenatal care.   Federico FlakeKimberly Niles Kaneshia Cater, MD

## 2015-09-10 ENCOUNTER — Encounter (HOSPITAL_COMMUNITY): Payer: Self-pay | Admitting: *Deleted

## 2015-09-10 ENCOUNTER — Inpatient Hospital Stay (HOSPITAL_COMMUNITY)
Admission: AD | Admit: 2015-09-10 | Discharge: 2015-09-12 | DRG: 775 | Disposition: A | Discharge status: Home / Self Care | Payer: Medicaid Other | Source: Ambulatory Visit | Attending: Obstetrics & Gynecology | Admitting: Obstetrics & Gynecology

## 2015-09-10 DIAGNOSIS — Z3A40 40 weeks gestation of pregnancy: Secondary | ICD-10-CM

## 2015-09-10 DIAGNOSIS — IMO0001 Reserved for inherently not codable concepts without codable children: Secondary | ICD-10-CM

## 2015-09-10 DIAGNOSIS — O99344 Other mental disorders complicating childbirth: Secondary | ICD-10-CM

## 2015-09-10 DIAGNOSIS — K219 Gastro-esophageal reflux disease without esophagitis: Secondary | ICD-10-CM | POA: Diagnosis present

## 2015-09-10 DIAGNOSIS — O9962 Diseases of the digestive system complicating childbirth: Secondary | ICD-10-CM | POA: Diagnosis present

## 2015-09-10 DIAGNOSIS — D649 Anemia, unspecified: Secondary | ICD-10-CM | POA: Diagnosis present

## 2015-09-10 DIAGNOSIS — Z8249 Family history of ischemic heart disease and other diseases of the circulatory system: Secondary | ICD-10-CM

## 2015-09-10 DIAGNOSIS — Z6841 Body Mass Index (BMI) 40.0 and over, adult: Secondary | ICD-10-CM | POA: Diagnosis not present

## 2015-09-10 DIAGNOSIS — O99019 Anemia complicating pregnancy, unspecified trimester: Secondary | ICD-10-CM | POA: Diagnosis present

## 2015-09-10 DIAGNOSIS — F329 Major depressive disorder, single episode, unspecified: Secondary | ICD-10-CM | POA: Diagnosis present

## 2015-09-10 DIAGNOSIS — O9902 Anemia complicating childbirth: Secondary | ICD-10-CM | POA: Diagnosis present

## 2015-09-10 DIAGNOSIS — O99214 Obesity complicating childbirth: Secondary | ICD-10-CM | POA: Diagnosis present

## 2015-09-10 DIAGNOSIS — Z823 Family history of stroke: Secondary | ICD-10-CM | POA: Diagnosis not present

## 2015-09-10 DIAGNOSIS — Z87891 Personal history of nicotine dependence: Secondary | ICD-10-CM

## 2015-09-10 DIAGNOSIS — F418 Other specified anxiety disorders: Secondary | ICD-10-CM

## 2015-09-10 DIAGNOSIS — O0990 Supervision of high risk pregnancy, unspecified, unspecified trimester: Secondary | ICD-10-CM

## 2015-09-10 DIAGNOSIS — O09219 Supervision of pregnancy with history of pre-term labor, unspecified trimester: Secondary | ICD-10-CM

## 2015-09-10 LAB — CBC
HCT: 35.2 % — ABNORMAL LOW (ref 36.0–46.0)
HEMOGLOBIN: 11.4 g/dL — AB (ref 12.0–15.0)
MCH: 25.7 pg — AB (ref 26.0–34.0)
MCHC: 32.4 g/dL (ref 30.0–36.0)
MCV: 79.3 fL (ref 78.0–100.0)
Platelets: 138 10*3/uL — ABNORMAL LOW (ref 150–400)
RBC: 4.44 MIL/uL (ref 3.87–5.11)
RDW: 16.6 % — ABNORMAL HIGH (ref 11.5–15.5)
WBC: 7.8 10*3/uL (ref 4.0–10.5)

## 2015-09-10 LAB — RPR: RPR: NONREACTIVE

## 2015-09-10 MED ORDER — OXYTOCIN BOLUS FROM INFUSION
500.0000 mL | INTRAVENOUS | Status: DC
  Administered 2015-09-10: 500 mL via INTRAVENOUS

## 2015-09-10 MED ORDER — MISOPROSTOL 200 MCG PO TABS
ORAL_TABLET | ORAL | Status: AC
  Administered 2015-09-10: 400 ug via ORAL
  Filled 2015-09-10: qty 2

## 2015-09-10 MED ORDER — ONDANSETRON HCL 4 MG PO TABS: 4.0000 mg | ORAL_TABLET | ORAL | Status: DC | PRN

## 2015-09-10 MED ORDER — OXYCODONE-ACETAMINOPHEN 5-325 MG PO TABS: 2.0000 | ORAL_TABLET | ORAL | Status: DC | PRN

## 2015-09-10 MED ORDER — BENZOCAINE-MENTHOL 20-0.5 % EX AERO: 1.0000 "application " | INHALATION_SPRAY | CUTANEOUS | Status: DC | PRN

## 2015-09-10 MED ORDER — SIMETHICONE 80 MG PO CHEW: 80.0000 mg | CHEWABLE_TABLET | ORAL | Status: DC | PRN

## 2015-09-10 MED ORDER — IBUPROFEN 600 MG PO TABS
600.0000 mg | ORAL_TABLET | Freq: Four times a day (QID) | ORAL | Status: DC
  Administered 2015-09-10 – 2015-09-12 (×10): 600 mg via ORAL
  Filled 2015-09-10 (×10): qty 1

## 2015-09-10 MED ORDER — FENTANYL CITRATE (PF) 100 MCG/2ML IJ SOLN
100.0000 ug | Freq: Once | INTRAMUSCULAR | Status: AC
  Administered 2015-09-10: 100 ug via INTRAVENOUS

## 2015-09-10 MED ORDER — FENTANYL 2.5 MCG/ML BUPIVACAINE 1/10 % EPIDURAL INFUSION (WH - ANES)
INTRAMUSCULAR | Status: AC
  Filled 2015-09-10: qty 125

## 2015-09-10 MED ORDER — FENTANYL CITRATE (PF) 100 MCG/2ML IJ SOLN
INTRAMUSCULAR | Status: AC
  Filled 2015-09-10: qty 2

## 2015-09-10 MED ORDER — ERYTHROMYCIN 5 MG/GM OP OINT
TOPICAL_OINTMENT | OPHTHALMIC | Status: AC
  Filled 2015-09-10: qty 1

## 2015-09-10 MED ORDER — ZOLPIDEM TARTRATE 5 MG PO TABS
5.0000 mg | ORAL_TABLET | Freq: Every evening | ORAL | Status: DC | PRN
  Administered 2015-09-10: 5 mg via ORAL
  Filled 2015-09-10: qty 1

## 2015-09-10 MED ORDER — LANOLIN HYDROUS EX OINT: TOPICAL_OINTMENT | CUTANEOUS | Status: DC | PRN

## 2015-09-10 MED ORDER — ACETAMINOPHEN 325 MG PO TABS: 650.0000 mg | ORAL_TABLET | ORAL | Status: DC | PRN

## 2015-09-10 MED ORDER — DIBUCAINE 1 % RE OINT: 1.0000 "application " | TOPICAL_OINTMENT | RECTAL | Status: DC | PRN

## 2015-09-10 MED ORDER — DIPHENHYDRAMINE HCL 25 MG PO CAPS
25.0000 mg | ORAL_CAPSULE | Freq: Four times a day (QID) | ORAL | Status: DC | PRN
Start: 2015-09-10 — End: 2015-09-12

## 2015-09-10 MED ORDER — OXYCODONE-ACETAMINOPHEN 5-325 MG PO TABS: 1.0000 | ORAL_TABLET | ORAL | Status: DC | PRN

## 2015-09-10 MED ORDER — LIDOCAINE HCL (PF) 1 % IJ SOLN
INTRAMUSCULAR | Status: DC
Start: 2015-09-10 — End: 2015-09-10
  Filled 2015-09-10: qty 30

## 2015-09-10 MED ORDER — ONDANSETRON HCL 4 MG/2ML IJ SOLN: 4.0000 mg | Freq: Four times a day (QID) | INTRAMUSCULAR | Status: DC | PRN

## 2015-09-10 MED ORDER — OXYTOCIN 40 UNITS IN LACTATED RINGERS INFUSION - SIMPLE MED
INTRAVENOUS | Status: AC
  Administered 2015-09-10: 62.5 mL/h via INTRAVENOUS
  Filled 2015-09-10: qty 1000

## 2015-09-10 MED ORDER — PRENATAL MULTIVITAMIN CH
1.0000 | ORAL_TABLET | Freq: Every day | ORAL | Status: DC
  Administered 2015-09-10 – 2015-09-12 (×3): 1 via ORAL
  Filled 2015-09-10 (×3): qty 1

## 2015-09-10 MED ORDER — MISOPROSTOL 200 MCG PO TABS
400.0000 ug | ORAL_TABLET | Freq: Once | ORAL | Status: AC
  Administered 2015-09-10: 400 ug via ORAL

## 2015-09-10 MED ORDER — OXYTOCIN 40 UNITS IN LACTATED RINGERS INFUSION - SIMPLE MED
62.5000 mL/h | INTRAVENOUS | Status: DC
  Administered 2015-09-10: 62.5 mL/h via INTRAVENOUS

## 2015-09-10 MED ORDER — CITRIC ACID-SODIUM CITRATE 334-500 MG/5ML PO SOLN: 30.0000 mL | ORAL | Status: DC | PRN

## 2015-09-10 MED ORDER — LIDOCAINE HCL (PF) 1 % IJ SOLN
30.0000 mL | INTRAMUSCULAR | Status: DC | PRN
  Filled 2015-09-10: qty 30

## 2015-09-10 MED ORDER — LACTATED RINGERS IV SOLN
500.0000 mL | INTRAVENOUS | Status: DC | PRN
Start: 2015-09-10 — End: 2015-09-10

## 2015-09-10 MED ORDER — SERTRALINE HCL 50 MG PO TABS
50.0000 mg | ORAL_TABLET | Freq: Every day | ORAL | Status: DC
  Administered 2015-09-10 – 2015-09-12 (×3): 50 mg via ORAL
  Filled 2015-09-10 (×4): qty 1

## 2015-09-10 MED ORDER — ONDANSETRON HCL 4 MG/2ML IJ SOLN: 4.0000 mg | INTRAMUSCULAR | Status: DC | PRN

## 2015-09-10 MED ORDER — WITCH HAZEL-GLYCERIN EX PADS: 1.0000 "application " | MEDICATED_PAD | CUTANEOUS | Status: DC | PRN

## 2015-09-10 MED ORDER — TETANUS-DIPHTH-ACELL PERTUSSIS 5-2.5-18.5 LF-MCG/0.5 IM SUSP: 0.5000 mL | Freq: Once | INTRAMUSCULAR | Status: DC

## 2015-09-10 MED ORDER — INFLUENZA VAC SPLIT QUAD 0.5 ML IM SUSY
0.5000 mL | PREFILLED_SYRINGE | INTRAMUSCULAR | Status: AC
  Administered 2015-09-11: 0.5 mL via INTRAMUSCULAR
  Filled 2015-09-10: qty 0.5

## 2015-09-10 MED ORDER — LACTATED RINGERS IV SOLN
INTRAVENOUS | Status: DC
  Administered 2015-09-10: 1000 mL via INTRAVENOUS

## 2015-09-10 MED ORDER — SENNOSIDES-DOCUSATE SODIUM 8.6-50 MG PO TABS
2.0000 | ORAL_TABLET | ORAL | Status: DC
  Administered 2015-09-10 – 2015-09-11 (×2): 2 via ORAL
  Filled 2015-09-10 (×2): qty 2

## 2015-09-10 MED ORDER — PHENYLEPHRINE 40 MCG/ML (10ML) SYRINGE FOR IV PUSH (FOR BLOOD PRESSURE SUPPORT)
PREFILLED_SYRINGE | INTRAVENOUS | Status: AC
  Filled 2015-09-10: qty 20

## 2015-09-10 MED ORDER — FENTANYL CITRATE (PF) 100 MCG/2ML IJ SOLN
INTRAMUSCULAR | Status: AC
  Administered 2015-09-10: 100 ug via INTRAVENOUS
  Filled 2015-09-10: qty 2

## 2015-09-10 NOTE — H&P (Signed)
LABOR ADMISSION HISTORY AND PHYSICAL  Sandra Wong is a 29 y.o. female [phone] with IUP at [redacted]w[redacted]d by presenting in active labor. She reports +FM, + contractions, No LOF, no VB, no blurry vision, headaches or peripheral edema, and RUQ pain.  She plans on breast feeding. She request IUD for birth control.  Dating: By [place] 02/14/15 --->  Estimated Date of Delivery: 09/09/15  Sono:     w0d, CWD, normal anatomy, cephalic presentation, 2924 g, 40 % EFW   Prenatal History/Complications:   Clinic   High Risk transfer from [facility]--patient left their practice Prenatal Labs  Dating U/S 02/14/15 - EDD 09/09/15 Blood type:   O+  Genetic Screen Quad: negative at [facility] Antibody: negative  Anatomic [place] Patient reports normal u/s @ [facility] but no records. Normal limited scan @ 38 wks Rubella:  immune  GTT  Third trimester: 102 RPR:   NR  Flu vaccine  declined HBsAg:   Neg  TDaP vaccine          08/06/15                               Rhogam: HIV:   NR  Baby Food       bottle                                        GBS: negative (For PCN allergy, check sensitivities)  Contraception  copper IUD Pap: 06/18/13 WNL  Circumcision  circumcision - outpatient   Pediatrician  [facility]   Support Person  [facility]     Past Medical History: Past Medical History  Diagnosis Date  . Headache(784.0)   . Anxiety   . Anemia 2009    s/p delivery  . Obesity   . Abortion   . Depression   . GERD (gastroesophageal reflux disease)   . Sleep apnea   . Chronic back pain     Past Surgical History: Past Surgical History  Procedure Laterality Date  . Dilation and curettage of uterus    . Induced abortion      Obstetrical History: OB History    Gravida Para Term Preterm AB TAB SAB Ectopic Multiple Living   0 0 0 2      Obstetric Comments   2015 - attempted magnesium, SROM, rapid delivery, postpartum hemorrhage - cytotec      Social History: Social History   Social History   . Marital Status: Single    Spouse Name: N/A  . Number of Children: N/A  . Years of Education: N/A   Social History Main Topics  . Smoking status: Former Smoker -- 0.25 packs/day for 3 years    Quit date: 06/08/2013  . Smokeless tobacco: Never Used  . Alcohol Use: No     Comment: occasional when not pregnant   . Drug Use: No  . Sexual Activity: Yes    Birth Control/ Protection: None   Other Topics Concern  . Not on file   Social History Narrative    Family History: Family History  Problem Relation Age of Onset  . Hypertension Mother   . Hypertension Paternal Aunt   . Cancer Maternal Grandfather   . Stroke Maternal Grandfather   . Hypertension Father   . Asthma Son   . Kidney disease  Paternal Grandmother   . Kidney disease Paternal Grandfather     Allergies: Allergies  Allergen Reactions  . Other     Has an allergy to some type of eye drop but does not remember the name (in childhood)    Prescriptions prior to admission  Medication Sig Dispense Refill Last Dose  . acetaminophen (TYLENOL) 325 MG tablet Take 650 mg by mouth every 6 (six) hours as needed for mild pain, moderate pain or headache.    Not Taking  . cyclobenzaprine (FLEXERIL) 10 MG tablet Take 10 mg by mouth 3 (three) times daily as needed for muscle spasms.   Taking  . oxyCODONE-acetaminophen (PERCOCET/ROXICET) 5-325 MG tablet Take 1 tablet by mouth every 6 (six) hours as needed for severe pain. (Patient not taking: Reported on 08/14/2015) 8 tablet 0 Not Taking  . Prenatal Vit-Fe Fumarate-FA (PRENATAL VITAMINS PLUS) 27-1 MG TABS Take 1 tablet by mouth daily. (Patient not taking: Reported on 08/25/2015) 30 tablet 6 Not Taking  . sertraline (ZOLOFT) 50 MG tablet Take 1 tablet (50 mg total) by mouth daily. (Patient not taking: Reported on 08/12/2015) 30 tablet 0 Not Taking     Review of Systems   All systems reviewed and negative except as stated in HPI  LMP 12/15/2014 (Approximate) General appearance:  alert and cooperative Lungs: clear to auscultation bilaterally Heart: regular rate and rhythm Abdomen: soft, non-tender; bowel sounds normal Extremities: Homans sign is negative, no sign of DVT Presentation: cephalic Fetal monitoring: FHT 140 bpm Uterine activity every 2-3 minutes   Prenatal labs: ABO, Rh:  o pos Antibody: Negative (05/13 0000) Rubella: !Error!immune RPR: Nonreactive (05/13 0000)  HBsAg: Negative (05/13 0000)  HIV: Non-reactive (05/13 0000)  GBS: Negative (11/17 0000)  1 hr Glucola  102 Genetic screening  Quad negative  Anatomy US Normal   Prenatal Transfer Tool  Maternal Diabetes: No Genetic Screening: Normal Maternal Ultrasounds/Referrals: Normal Fetal Ultrasounds or other Referrals:  None Maternal Substance Abuse:  No Significant Maternal Medications:  None Significant Maternal Lab Results: None  No results found for this or any previous visit (from the past 24 hour(s)).  Patient Active Problem List   Diagnosis Date Noted  . Active labor at term 09/10/2015  . Supervision of high-risk pregnancy 07/19/2015  . Previous preterm delivery, antepartum 04/29/2015  . Hx of postpartum hemorrhage, currently pregnant 04/29/2015  . Morbid obesity (HCC) 04/29/2015  . Anxiety 03/28/2015  . Depression 03/28/2015  . Anemia affecting pregnancy 03/28/2015  . Migraine with aura 06/18/2013    Assessment: Sandra Wong RetortShanikwa Heiler is a 29 y.o. [id number] at 2371w1d here for active labor   #Labor: No augmentation  #Pain: Fentanyl  #FWB: Category 1  #ID:  GBS negative  #MOF: Breast #MOC: IUD  #Circ:  Outpatient Circ   [name], MD    OB FELLOW HISTORY AND PHYSICAL ATTESTATION  I have seen and examined this patient; I agree with above documentation in the resident's note. For MDD will plan to re-start zoloft PP   Henning Ehle B Lavell Ridings 09/10/2015, 4:32 AM

## 2015-09-10 NOTE — Lactation Note (Signed)
This note was copied from the chart of Sandra Wong. Lactation Consultation Note  Patient Name: Sandra Nira RetortShanikwa Wong ZOXWR'UToday's Date: 09/10/2015 Reason for consult: Initial assessment Baby at 15 hr of life and has bf well 3x with 5 attempts. Mom has a Hx of over supply. Answered questions about pumping between bf. Discussed baby behavior, feeding frequency, baby belly size, voids, wt loss, breast changes, and nipple care. Demonstrated manual expression, colostrum noted bilaterally. Given lactation handouts. Aware of OP services and support group.     Maternal Data Has patient been taught Hand Expression?: Yes Does the patient have breastfeeding experience prior to this delivery?: Yes  Feeding Feeding Type: Breast Milk Length of feed: 10 min  LATCH Score/Interventions                      Lactation Tools Discussed/Used WIC Program: Yes [facility]   Consult Status Consult Status: Follow-up Date: 09/11/15 Follow-up type: In-patient    [name] 09/10/2015, 4:27 PM

## 2015-09-10 NOTE — Progress Notes (Signed)
UR chart review completed.  

## 2015-09-11 MED ORDER — SERTRALINE HCL 50 MG PO TABS: 50.0000 mg | ORAL_TABLET | Freq: Every day | ORAL | Status: DC

## 2015-09-11 MED ORDER — IBUPROFEN 600 MG PO TABS: 600.0000 mg | ORAL_TABLET | Freq: Four times a day (QID) | ORAL | Status: DC | PRN

## 2015-09-11 NOTE — Lactation Note (Signed)
This note was copied from the chart of Sandra Annella Tomasetti. Lactation Consultation Note  MD states mother will be staying to work on breastfeeding.  Mother seems very motivated. Mother prepumped to help evert nipples. Attempted latching baby in football hold without NS but baby is very sleepy. Left LC phone number and suggest mother call to help w/ next feeding once baby cues. Encouraged STS.  Mother has been wearing shells but states they were uncomfortable during the night when she was sleeping. Advised her to wear them when she is awake and not when she is sleeping.  Patient Name: Sandra Wong ZOXWR'UToday's Date: 09/11/2015 Reason for consult: Follow-up assessment   Maternal Data    Feeding Feeding Type: Breast Fed Length of feed: 10 min  LATCH Score/Interventions Latch: Repeated attempts needed to sustain latch, nipple held in mouth throughout feeding, stimulation needed to elicit sucking reflex.  Audible Swallowing: None  Type of Nipple: Everted at rest and after stimulation (short shaft) Intervention(s): Hand pump;Shells  Comfort (Breast/Nipple): Soft / non-tender  Interventions (Mild/moderate discomfort): Hand expression;Pre-pump if needed  Hold (Positioning): Assistance needed to correctly position infant at breast and maintain latch.  LATCH Score: 6  Lactation Tools Discussed/Used     Consult Status Consult Status: Follow-up Date: 09/11/15 Follow-up type: In-patient    Dahlia ByesBerkelhammer, Ruth [facility] LLC[facility] 09/11/2015, 9:44 AM

## 2015-09-11 NOTE — Discharge Summary (Signed)
OB Discharge Summary     Patient Name: Sandra Wong DOB: 1986-06-28 MRN: [id number]  Date of admission: 09/10/2015 Delivering MD: Berton BonMIKELL, ASIYAH ZAHRA   Date of discharge: 09/11/2015  Admitting diagnosis: 40 WEEKS CTX Intrauterine pregnancy: 6953w1d     Secondary diagnosis:  Active Problems:   Anemia affecting pregnancy   Previous preterm delivery, antepartum   Supervision of high-risk pregnancy   Active labor at term  Additional problems: none     Discharge diagnosis: Term Pregnancy Delivered                                                                                                Post partum procedures:none  Augmentation: none  Complications: None  Hospital course:  Onset of Labor With Vaginal Delivery     29 y.o. yo [id number] at 7453w1d was admitted in Active Laboron 09/10/2015. Patient had an uncomplicated labor course as follows:  Membrane Rupture Time/Date: 12:44 AM ,09/10/2015   Intrapartum Procedures: Episiotomy: None [1]                                         Lacerations:  None [1]  Patient had a delivery of a Viable infant. 09/10/2015  Information for the patient's newborn:  Scherrie BatemanHarrington, Boy Deandria [[id number]][[id number]]  Delivery Method: Vaginal, Spontaneous Delivery (Filed from Delivery Summary)    Pateint had an uncomplicated postpartum course.  She is ambulating, tolerating a regular diet, passing flatus, and urinating well. Patient is discharged home in stable condition on 09/11/2015    Physical exam  Filed Vitals:   09/10/15 0330 09/10/15 0753 09/10/15 1847 09/11/15 0610  BP:  123/66 120/73 120/74  Pulse: 91 85 84 88  Temp: 97.8 F (36.6 C) 97.5 F (36.4 C) 97.7 F (36.5 C) 97.8 F (36.6 C)  TempSrc: Oral Oral Oral Oral  Resp: 20 20 20 18   Height: 5\' 9"  (1.753 m)     Weight: 134.401 kg (296 lb 4.8 oz)     SpO2: 100%  98%    General: alert and cooperative Lochia: appropriate Uterine Fundus: firm Incision: N/A DVT Evaluation: No evidence  of DVT seen on physical exam. Labs: Lab Results  Component Value Date   WBC 7.8 09/10/2015   HGB 11.4* 09/10/2015   HCT 35.2* 09/10/2015   MCV 79.3 09/10/2015   PLT 138* 09/10/2015   CMP Latest Ref Rng 01/15/2015  Glucose 70 - 99 mg/dL 84  BUN 6 - 23 mg/dL 8  Creatinine 0.270.50 - 2.531.10 mg/dL 6.640.71  Sodium 403135 - 474145 mmol/L 135  Potassium 3.5 - 5.1 mmol/L 3.8  Chloride 96 - 112 mmol/L 106  CO2 19 - 32 mmol/L 24  Calcium 8.4 - 10.5 mg/dL 8.7  Total Protein 6.0 - 8.3 g/dL 7.5  Total Bilirubin 0.3 - 1.2 mg/dL 0.3  Alkaline Phos 39 - 117 U/L 34(L)  AST 0 - 37 U/L 20  ALT 0 - 35 U/L 23    Discharge instruction: per After Visit Summary and "Baby and  Me Booklet".  After visit meds:    Medication List    STOP taking these medications        cyclobenzaprine 10 MG tablet  Commonly known as:  FLEXERIL      TAKE these medications        ibuprofen 600 MG tablet  Commonly known as:  ADVIL,MOTRIN  Take 1 tablet (600 mg total) by mouth every 6 (six) hours as needed.     sertraline 50 MG tablet  Commonly known as:  ZOLOFT  Take 1 tablet (50 mg total) by mouth daily.        Diet: routine diet  Activity: Advance as tolerated. Pelvic rest for 6 weeks.   Outpatient follow up:2 weeks- for a preop BTL appt Follow up Appt:No future appointments. Follow up Visit:No Follow-up on file.  Postpartum contraception: Tubal Ligation- PP  Newborn Data: Live born female  Birth Weight: 6 lb 12.6 oz (3080 g) APGAR: 9, 10  Baby Feeding: Breast Disposition:home with mother   09/11/2015 Cam Hai, CNM  09/11/2015

## 2015-09-11 NOTE — Lactation Note (Signed)
This note was copied from the chart of Sandra Wong. Lactation Consultation Note Mom and baby frustrated d/t inability to latch. Mom has semi flat nipples that evert w/stimulation, but can go semi flat nipple at end of large pendulum breast. Rolled cloth under breast for support. Assisted in football hold, pillows for support to lay baby on, application taught of NS. Receptive for learning. Fitted w/#24 NS. Gave #20 as well if don't feel like it works. Hand pump given for nipple and breast stimulation. Baby falling asleep w/mom. Heard swallows and saw colostrum in NS. Hand expression taught, breast filling, mom states they hurt. Gently hand expression. Gave w/curve tip syring while BF/stimulation. Shells given to wear in bra. Praised for doing right things.  Patient Name: Sandra Nira RetortShanikwa Kareem XLKGM'WToday's Date: 09/11/2015 Reason for consult: Follow-up assessment;Difficult latch   Maternal Data    Feeding Feeding Type: Breast Milk Length of feed: 10 min  LATCH Score/Interventions Latch: Repeated attempts needed to sustain latch, nipple held in mouth throughout feeding, stimulation needed to elicit sucking reflex. Intervention(s): Adjust position;Assist with latch;Breast massage;Breast compression  Audible Swallowing: Spontaneous and intermittent Intervention(s): Hand expression;Alternate breast massage  Type of Nipple: Flat Intervention(s): Shells;Hand pump  Comfort (Breast/Nipple): Filling, red/small blisters or bruises, mild/mod discomfort  Problem noted: Mild/Moderate discomfort;Filling Interventions (Filling): Firm support;Frequent nursing;Massage;Hand pump Interventions (Mild/moderate discomfort): Pre-pump if needed;Hand expression;Hand massage;Breast shields  Hold (Positioning): Assistance needed to correctly position infant at breast and maintain latch. Intervention(s): Skin to skin;Position options;Support Pillows;Breastfeeding basics reviewed  LATCH Score:  6  Lactation Tools Discussed/Used Tools: Shells;Nipple Dorris CarnesShields;Pump Nipple shield size: 24;20 Shell Type: Inverted Breast pump type: Manual Pump Review: Setup, frequency, and cleaning;Milk Storage Initiated by:: Peri JeffersonL. Desera Graffeo RN Date initiated:: 09/11/15   Consult Status Consult Status: Follow-up Date: 09/11/15 Follow-up type: In-patient    Charyl DancerCARVER, Samona Chihuahua G 09/11/2015, 3:45 AM

## 2015-09-11 NOTE — Clinical Social Work Maternal (Signed)
CLINICAL SOCIAL WORK MATERNAL/CHILD NOTE  Patient Details  Name: Sandra Wong MRN: [id number] Date of Birth: 12/04/1985  Date:  09/11/2015  Clinical Social Worker Initiating Note:  Sandra Wong MSW, LCSW Date/ Time Initiated:  09/11/15/1415    Child's Name:  Sandra Wong  Legal Guardian:  Sandra Wong and Sandra Wong  Need for Interpreter:  None   Date of Referral:  09/10/15     Reason for Referral: History of anxiety and depression  Referral Source:  [facility]   Address:  [address] [place], [place] [address]  Phone number:  [phone]   Household Members:  Minor Children, FOB  Natural Supports (not living in the home):  Immediate Family, Extended Family, Friends   Professional Supports: OB Case manager   Employment: Unemployed   Type of Work:   Wong/A  Education:    Wong/A  Financial Resources:  Medicaid   Other Resources:  Food Stamps , WIC   Cultural/Religious Considerations Which May Impact Care:  None reported  Strengths:  Ability to meet basic needs , Home prepared for child , Pediatrician chosen    Risk Factors/Current Problems:  Mental Health Concerns , Family/Relationship Issues    Cognitive State:  Able to Concentrate , Alert , Linear Thinking , Goal Oriented    Mood/Affect:  Happy , Interested , Tearful    CSW Assessment:  CSW received request for consult due to [name] presenting with a history of anxiety and depression.  CSW notes that [name] is currently prescribed Zoloft after reports of increase in depressive symptoms in November.     FOB was initially present during the assessment; however, he left with their one year old son in order to complete errands and pick their 7 year old son up from school. CSW spent approximately 60 minutes with the [name] in order to provide her with supportive listening and brief therapy.  She was easily and readily engaged, openly discussed her thoughts and feelings, and was receptive to beginning to  explore how to support her mental health.   [name] reported long history of depression and anxiety, and identified a connection between mental health concerns and her highly strained relationship with the FOB. She shared an awareness that she has unrealistic expectations for the FOB, and discussed how she becomes frustrated, stressed, and overwhelmed when she continuously feels rejected by him.  [name] discussed at length the previous behaviors that the FOB has exhibited that has led to her believe that he does not "care". [name] presented with insight that she has hopes for him to change,  recognizes that it is unlikely based on history, and shared that she continues to have this idealistic dream of him being a "good father".  [name] was able to identify the numerous obstacles that she has encountered that have prevented her from ending the relationship, but also recognizes how the ongoing relationship with him has negatively impacted her children and her mental health.  [name] was receptive to exploring an ideal living situation for herself, was receptive to beginning to explore how she defines happiness, and the life she hopes to achieve someday. CSW also began to assist the [name] to disengage from expectations for the FOB that are linked with her feelings of anger and frustration toward him. [name] continues to be unsure of how she wants the relationship unfold postpartum, but also recognized that she often avoids wanting to make a decision since it is "easier" and she fears the unknown.   Per chart review, [name] presents   with history of anxiety and depression since 2012.  She stated that she was prescribed Zoloft last month due to increase in symptoms.   [name] reported that despite increase in symptoms, she still is able to feel happiness and enjoy her time with her children.  [name] smiled as she discussed her children, CSW praised [name]'s ability to continue to identify areas of gratitude.  [name] shared that she attempts to remain  positive and optimistic in regards to her children.   [name] became appropriately tearful during the visit, but she stated that she was not sad.  [name] stated that she was only crying because it was emotional for her to talk about the FOB.  [name] stated that she intends to continue taking Zoloft postpartum. [name] expressed interest in returning to [facility] for therapy ([name] reported previous participation when she was first diagnosed with depression).  [name] verbalized understanding of her increased risk for developing perinatal mood and anxiety disorder due to her prior history and symptoms during the pregnancy.  [name] agreed to follow up with her medical provider if she notes that symptoms are worsening.    [name] expressed appreciation for the visit and the support. She shared that it was helpful to have an opportunity to express herself since she often finds it difficult to find someone to talk about how she is feeling.  [name] denied additional questions, concerns, or needs at this time, but acknowledged ongoing CSW availability if needs arise during the admission.   CSW Plan/Description:   1. Patient/Family Education: Perinatal mood and anxiety disorders.   2. [name] declined offer for CSW to refer her to outpatient therapy. [name] stated that she has the contact agency for [facility] (previous outpatient clinic), and expressed interest in contacting the agency herself to re-start therapy.   3. No Further Intervention Required/No Barriers to Discharge    Sandra Wong, Sandra Stach N, LCSW 09/11/2015, 4:04 PM

## 2015-09-11 NOTE — Lactation Note (Signed)
This note was copied from the chart of [name]. Lactation Consultation Note  Patient Name: [name]'s Date: 09/11/2015 Reason for consult: Follow-up assessment RN requested help because mom's breast are filling quickly. Baby is having a hard time even with a NS to latch to either breast. He was able to get on the R but not long enough to soften the breast. Started DEBP, the milk is moving slowly. After 15 minutes mom had 24ml and 18ml. Given bottles for storage and encouraged her to put baby to breast instead of feeding back the pumped milk to keep from getting so full again. Reviewed feeding frequency and breast care.   Maternal Data    Feeding Feeding Type: Breast Fed Length of feed: 15 min  LATCH Score/Interventions Latch: Repeated attempts needed to sustain latch, nipple held in mouth throughout feeding, stimulation needed to elicit sucking reflex. Intervention(s): Adjust position;Assist with latch;Breast compression;Breast massage  Audible Swallowing: Spontaneous and intermittent Intervention(s): Hand expression;Skin to skin Intervention(s): Alternate breast massage;Hand expression;Skin to skin  Type of Nipple: Everted at rest and after stimulation Intervention(s): Double electric pump  Comfort (Breast/Nipple): Engorged, cracked, bleeding, large blisters, severe discomfort Problem noted: Engorgment Intervention(s): Ice;Hand expression  Problem noted: Mild/Moderate discomfort;Filling Interventions (Filling): Double electric pump;Frequent nursing Interventions (Mild/moderate discomfort): Pre-pump if needed;Post-pump;Hand expression  Hold (Positioning): Assistance needed to correctly position infant at breast and maintain latch. Intervention(s): Support Pillows;Position options  LATCH Score: 6  Lactation Tools Discussed/Used Pump Review: Setup, frequency, and cleaning;Milk Storage Initiated by:: ES Date initiated:: 09/11/15   Consult  Status Consult Status: Follow-up Date: 09/12/15 Follow-up type: In-patient    Rulon Eisenmengerlizabeth E Taylinn Brabant 09/11/2015, 10:14 PM

## 2015-09-11 NOTE — Discharge Instructions (Signed)
Laparoscopic Tubal Ligation Laparoscopic tubal ligation is a procedure that closes the fallopian tubes at a time other than right after childbirth. When the fallopian tubes are closed, the eggs that are released from the ovaries cannot enter the uterus, and sperm cannot reach the egg. Tubal ligation is also known as getting your "tubes tied." Tubal ligation is done so you will not be able to get pregnant or have a baby. Although this procedure may be undone (reversed), it should be considered permanent and irreversible. If you want to have future pregnancies, you should not have this procedure. LET [facility] CARE PROVIDER KNOW ABOUT:  Any allergies you have.  All medicines you are taking, including vitamins, herbs, eye drops, creams, and over-the-counter medicines. This includes any use of steroids, either by mouth or in cream form.  Previous problems you or members of your family have had with the use of anesthetics.  Any blood disorders you have.  Previous surgeries you have had.  Any medical conditions you may have.  Possibility of pregnancy, if this applies.  Any past pregnancies. RISKS AND COMPLICATIONS  Infection.  Bleeding.  Injury to surrounding organs.  Side effects from anesthetics.  Failure of the procedure.  Ectopic pregnancy.  Future regret about having the procedure done. BEFORE THE PROCEDURE  Ask your health care provider about:  Changing or stopping your regular medicines. This is especially important if you are taking diabetes medicines or blood thinners.  Taking medicines such as aspirin and ibuprofen. These medicines can thin your blood. Do not take these medicines before your procedure if your health care provider instructs you not to.  Follow instructions from your health care provider about eating and drinking restrictions.  Plan to have someone take you home after the procedure.  If you go home right after the procedure, plan to have someone  with you for 24 hours. PROCEDURE  You will be given one or more of the following:  A medicine that helps you relax (sedative).  A medicine that numbs the area (local anesthetic).  A medicine that makes you fall asleep (general anesthetic).  A medicine that is injected into an area of your body that numbs everything below the injection site (regional anesthetic).  If you have been given general anesthetic, a tube will be put down your throat to help you breathe.  Two small cuts (incisions) will be made in the lower abdominal area and near the belly button.  Your bladder may be emptied with a small tube (catheter).  Your abdomen will be inflated with a safe gas (carbon dioxide). This will help to give the surgeon room to operate and visualize, and it will help the surgeon to avoid other organs.  A thin, lighted tube (laparoscope) with a camera attached will be inserted into your abdomen through one of the incisions near the belly button. Other small instruments will be inserted through the other abdominal incision.  The fallopian tubes will be tied off or burned (cauterized), or they will be blocked with a clip, ring, or clamp. In many cases, a small portion in the center of each fallopian tube will also be removed.  After the fallopian tubes are blocked, the gas will be released from the abdomen.  The incisions will be closed with stitches (sutures).  A bandage (dressing) will be placed over the incisions. The procedure may vary among health care providers and hospitals. AFTER THE PROCEDURE  Your blood pressure, heart rate, breathing rate, and blood oxygen level  will be monitored often until the medicines you were given have worn off.  You will be given pain medicine as needed.  If you had general anesthetic, you may have some mild discomfort in your throat. This is from the breathing tube that was placed in your throat while you were sleeping.  You may experience discomfort in  the shoulder area from some trapped air between your liver and your diaphragm. This sensation is normal, and it will slowly go away on its own.  You will have some mild abdominal discomfort for 3--7 days.   This information is not intended to replace advice given to you by your health care provider. Make sure you discuss any questions you have with your health care provider.   Document Released: 12/20/2000 Document Revised: 01/28/2015 Document Reviewed: 12/25/2011 Elsevier Interactive Patient Education 2016 Elsevier Inc. Postpartum Care After Vaginal Delivery After you deliver your newborn (postpartum period), the usual stay in the hospital is 24-72 hours. If there were problems with your labor or delivery, or if you have other medical problems, you might be in the hospital longer.  While you are in the hospital, you will receive help and instructions on how to care for yourself and your newborn during the postpartum period.  While you are in the hospital:  Be sure to tell your nurses if you have pain or discomfort, as well as where you feel the pain and what makes the pain worse.  If you had an incision made near your vagina (episiotomy) or if you had some tearing during delivery, the nurses may put ice packs on your episiotomy or tear. The ice packs may help to reduce the pain and swelling.  If you are breastfeeding, you may feel uncomfortable contractions of your uterus for a couple of weeks. This is normal. The contractions help your uterus get back to normal size.  It is normal to have some bleeding after delivery.  For the first 1-3 days after delivery, the flow is red and the amount may be similar to a period.  It is common for the flow to start and stop.  In the first few days, you may pass some small clots. Let your nurses know if you begin to pass large clots or your flow increases.  Do not  flush blood clots down the toilet before having the nurse look at them.  During the  next 3-10 days after delivery, your flow should become more watery and pink or brown-tinged in color.  Ten to fourteen days after delivery, your flow should be a small amount of yellowish-white discharge.  The amount of your flow will decrease over the first few weeks after delivery. Your flow may stop in 6-8 weeks. Most women have had their flow stop by 12 weeks after delivery.  You should change your sanitary pads frequently.  Wash your hands thoroughly with soap and water for at least 20 seconds after changing pads, using the toilet, or before holding or feeding your newborn.  You should feel like you need to empty your bladder within the first 6-8 hours after delivery.  In case you become weak, lightheaded, or faint, call your nurse before you get out of bed for the first time and before you take a shower for the first time.  Within the first few days after delivery, your breasts may begin to feel tender and full. This is called engorgement. Breast tenderness usually goes away within 48-72 hours after engorgement occurs. You may also  notice milk leaking from your breasts. If you are not breastfeeding, do not stimulate your breasts. Breast stimulation can make your breasts produce more milk.  Spending as much time as possible with your newborn is very important. During this time, you and your newborn can feel close and get to know each other. Having your newborn stay in your room (rooming in) will help to strengthen the bond with your newborn. It will give you time to get to know your newborn and become comfortable caring for your newborn.  Your hormones change after delivery. Sometimes the hormone changes can temporarily cause you to feel sad or tearful. These feelings should not last more than a few days. If these feelings last longer than that, you should talk to your caregiver.  If desired, talk to your caregiver about methods of family planning or contraception.  Talk to your caregiver  about immunizations. Your caregiver may want you to have the following immunizations before leaving the hospital:  Tetanus, diphtheria, and pertussis (Tdap) or tetanus and diphtheria (Td) immunization. It is very important that you and your family (including grandparents) or others caring for your newborn are up-to-date with the Tdap or Td immunizations. The Tdap or Td immunization can help protect your newborn from getting ill.  Rubella immunization.  Varicella (chickenpox) immunization.  Influenza immunization. You should receive this annual immunization if you did not receive the immunization during your pregnancy.   This information is not intended to replace advice given to you by your health care provider. Make sure you discuss any questions you have with your health care provider.   Document Released: 07/11/2007 Document Revised: 06/07/2012 Document Reviewed: 05/10/2012 Elsevier Interactive Patient Education [employer]

## 2015-09-12 ENCOUNTER — Ambulatory Visit: Payer: Self-pay

## 2015-09-12 ENCOUNTER — Encounter (HOSPITAL_COMMUNITY): Payer: Self-pay | Admitting: *Deleted

## 2015-09-12 NOTE — Lactation Note (Signed)
This note was copied from the chart of Sandra Wong. Lactation Consultation Note  Patient Name: Sandra Nira RetortShanikwa Ozburn ZOXWR'UToday's Date: 09/12/2015 Reason for consult: Follow-up assessment [facility] [place] Mom and baby set for d/c tonight. Reviewed pumping, feeding frequency, breast changes, and nipple care. Encouraged mom to keep latching the baby and pump as needed to keep the breast soft. Mom will call as needed.   Maternal Data    Feeding Feeding Type: Breast Milk Nipple Type: Slow - flow  LATCH Score/Interventions                      Lactation Tools Discussed/Used     Consult Status Consult Status: Complete    Rulon Eisenmengerlizabeth E Nettye Flegal 09/12/2015, 5:15 PM

## 2015-09-12 NOTE — Discharge Summary (Signed)
OB Discharge Summary     Patient Name: Sandra Wong DOB: March 04, 1986 MRN: [id number]  Date of admission: 09/10/2015 Delivering MD: Berton Bon   Date of discharge: 09/12/2015  Admitting diagnosis: 40 WEEKS CTX Intrauterine pregnancy: [redacted]w[redacted]d     Secondary diagnosis:  Active Problems:   Anemia affecting pregnancy   Previous preterm delivery, antepartum   Supervision of high-risk pregnancy   Active labor at term  Additional problems: Hx Depression     Discharge diagnosis: Term Pregnancy Delivered                                                                                                Post partum procedures:n/a  Augmentation: AROM  Complications: None  Hospital course:  Onset of Labor With Vaginal Delivery     29 y.o. yo [id number] at [redacted]w[redacted]d was admitted in Active Laboron 09/10/2015. Patient had an uncomplicated labor course as follows:  Membrane Rupture Time/Date: 12:44 AM ,09/10/2015   Intrapartum Procedures: Episiotomy: None [1]                                         Lacerations:  None [1]  Patient had a delivery of a Viable infant. 09/10/2015  Information for the patient's newborn:  Emmalena, Canny [[id number]]  Delivery Method: Vaginal, Spontaneous Delivery (Filed from Delivery Summary)    Pateint had an uncomplicated postpartum course.  She is ambulating, tolerating a regular diet, passing flatus, and urinating well. Patient is discharged home in stable condition on No discharge date for patient encounter.Marland Kitchen    Physical exam  Filed Vitals:   09/10/15 1847 09/11/15 0610 09/11/15 1803 09/12/15 0505  BP: 120/73 120/74 114/68 143/77  Pulse: 84 88 97 83  Temp: 97.7 F (36.5 C) 97.8 F (36.6 C) 97.6 F (36.4 C) 97.8 F (36.6 C)  TempSrc: Oral Oral Oral Oral  Resp: Height:      Weight:      SpO2: 98%      General: alert, cooperative and no distress Lochia: appropriate Uterine Fundus: firm Incision: none DVT Evaluation:  No evidence of DVT seen on physical exam. Labs: Lab Results  Component Value Date   WBC 7.8 09/10/2015   HGB 11.4* 09/10/2015   HCT 35.2* 09/10/2015   MCV 79.3 09/10/2015   PLT 138* 09/10/2015   CMP Latest Ref Rng 01/15/2015  Glucose 70 - 99 mg/dL 84  BUN 6 - 23 mg/dL 8  Creatinine 1.30 - 8.65 mg/dL 7.84  Sodium 696 - 295 mmol/L 135  Potassium 3.5 - 5.1 mmol/L 3.8  Chloride 96 - 112 mmol/L 106  CO2 19 - 32 mmol/L 24  Calcium 8.4 - 10.5 mg/dL 8.7  Total Protein 6.0 - 8.3 g/dL 7.5  Total Bilirubin 0.3 - 1.2 mg/dL 0.3  Alkaline Phos 39 - 117 U/L 34(L)  AST 0 - 37 U/L 20  ALT 0 - 35 U/L 23    Discharge instruction: per After Visit Summary and "Baby and  Me Booklet".  After visit meds:    Medication List    STOP taking these medications        cyclobenzaprine 10 MG tablet  Commonly known as:  FLEXERIL      TAKE these medications        ibuprofen 600 MG tablet  Commonly known as:  ADVIL,MOTRIN  Take 1 tablet (600 mg total) by mouth every 6 (six) hours as needed.     sertraline 50 MG tablet  Commonly known as:  ZOLOFT  Take 1 tablet (50 mg total) by mouth daily.        Diet: routine diet  Activity: Advance as tolerated. Pelvic rest for 6 weeks.   Outpatient follow up:6 weeks Follow up Appt:Future Appointments Date Time Provider Department Center  10/16/2015 2:15 PM Tereso NewcomerUgonna A Anyanwu, MD [facility]   Follow up Visit:No Follow-up on file.  Postpartum contraception: Tubal Ligation  Newborn Data: Live born female  Birth Weight: 6 lb 12.6 oz (3080 g) APGAR: 9, 10  Baby Feeding: Breast Disposition:home with mother   09/12/2015 Wynelle BourgeoisWILLIAMS,MARIE, CNM

## 2015-09-12 NOTE — Lactation Note (Signed)
This note was copied from the chart of Sandra Wong. Lactation Consultation Note  Patient Name: Sandra Wong ZHYQM'VToday's Date: 09/12/2015 Reason for consult: Follow-up assessment Baby at 58 hr of life, not feeding well from breast or bottle, with an 8% wt loss. Mom's breast are still tight and lumpy. Mom has been pumping, reviewed milk handling. It took 30 minutes to get baby to take 20 ml of pumped milk. Baby is very sleepy and likes to suck his tongue. Given bottles and labels, encouraged to continue to pump 8+/24hr until baby is latching well and offer the breast on demand or 8+/24hr. Reviewed engorgement. [facility], given paper work, and instructions to call back after she has been d/c. Mom is aware of OP services and support group. She will call as needed for latch help.     Maternal Data    Feeding Feeding Type: Bottle Fed - Breast Milk Nipple Type: Slow - flow  LATCH Score/Interventions                      Lactation Tools Discussed/Used     Consult Status Consult Status: Follow-up Date: 09/13/15 Follow-up type: In-patient    Rulon Eisenmengerlizabeth E Daylan Juhnke 09/12/2015, 11:27 AM

## 2015-09-13 LAB — TYPE AND SCREEN
ABO/RH(D): O POS
Antibody Screen: POSITIVE
DAT, IGG: NEGATIVE
UNIT DIVISION: 0
Unit division: 0

## 2015-09-14 NOTE — Progress Notes (Signed)
NST reactive and reassuring on 08/26/15

## 2015-09-17 ENCOUNTER — Inpatient Hospital Stay (HOSPITAL_COMMUNITY): Admission: RE | Admit: 2015-09-17 | Payer: Medicaid Other | Source: Ambulatory Visit

## 2015-10-16 ENCOUNTER — Ambulatory Visit: Payer: Medicaid Other | Admitting: Obstetrics & Gynecology

## 2015-11-10 ENCOUNTER — Ambulatory Visit (INDEPENDENT_AMBULATORY_CARE_PROVIDER_SITE_OTHER): Payer: Medicaid Other | Admitting: Obstetrics & Gynecology

## 2015-11-10 ENCOUNTER — Encounter: Payer: Self-pay | Admitting: Obstetrics & Gynecology

## 2015-11-10 DIAGNOSIS — Z3009 Encounter for other general counseling and advice on contraception: Secondary | ICD-10-CM

## 2015-11-10 NOTE — Patient Instructions (Signed)
Laparoscopic Tubal Ligation Laparoscopic tubal ligation is a procedure that closes the fallopian tubes at a time other than right after childbirth. When the fallopian tubes are closed, the eggs that are released from the ovaries cannot enter the uterus, and sperm cannot reach the egg. Tubal ligation is also known as getting your "tubes tied." Tubal ligation is done so you will not be able to get pregnant or have a baby. Although this procedure may be undone (reversed), it should be considered permanent and irreversible. If you want to have future pregnancies, you should not have this procedure. LET YOUR HEALTH CARE PROVIDER KNOW ABOUT:  Any allergies you have.  All medicines you are taking, including vitamins, herbs, eye drops, creams, and over-the-counter medicines. This includes any use of steroids, either by mouth or in cream form.  Previous problems you or members of your family have had with the use of anesthetics.  Any blood disorders you have.  Previous surgeries you have had.  Any medical conditions you may have.  Possibility of pregnancy, if this applies.  Any past pregnancies. RISKS AND COMPLICATIONS  Infection.  Bleeding.  Injury to surrounding organs.  Side effects from anesthetics.  Failure of the procedure.  Ectopic pregnancy.  Future regret about having the procedure done. BEFORE THE PROCEDURE  Ask your health care provider about:  Changing or stopping your regular medicines. This is especially important if you are taking diabetes medicines or blood thinners.  Taking medicines such as aspirin and ibuprofen. These medicines can thin your blood. Do not take these medicines before your procedure if your health care provider instructs you not to.  Follow instructions from your health care provider about eating and drinking restrictions.  Plan to have someone take you home after the procedure.  If you go home right after the procedure, plan to have someone  with you for 24 hours. PROCEDURE  You will be given one or more of the following:  A medicine that helps you relax (sedative).  A medicine that numbs the area (local anesthetic).  A medicine that makes you fall asleep (general anesthetic).  A medicine that is injected into an area of your body that numbs everything below the injection site (regional anesthetic).  If you have been given general anesthetic, a tube will be put down your throat to help you breathe.  Two small cuts (incisions) will be made in the lower abdominal area and near the belly button.  Your bladder may be emptied with a small tube (catheter).  Your abdomen will be inflated with a safe gas (carbon dioxide). This will help to give the surgeon room to operate and visualize, and it will help the surgeon to avoid other organs.  A thin, lighted tube (laparoscope) with a camera attached will be inserted into your abdomen through one of the incisions near the belly button. Other small instruments will be inserted through the other abdominal incision.  The fallopian tubes will be tied off or burned (cauterized), or they will be blocked with a clip, ring, or clamp. In many cases, a small portion in the center of each fallopian tube will also be removed.  After the fallopian tubes are blocked, the gas will be released from the abdomen.  The incisions will be closed with stitches (sutures).  A bandage (dressing) will be placed over the incisions. The procedure may vary among health care providers and hospitals. AFTER THE PROCEDURE  Your blood pressure, heart rate, breathing rate, and blood oxygen level   will be monitored often until the medicines you were given have worn off.  You will be given pain medicine as needed.  If you had general anesthetic, you may have some mild discomfort in your throat. This is from the breathing tube that was placed in your throat while you were sleeping.  You may experience discomfort in  the shoulder area from some trapped air between your liver and your diaphragm. This sensation is normal, and it will slowly go away on its own.  You will have some mild abdominal discomfort for 3--7 days.   This information is not intended to replace advice given to you by your health care provider. Make sure you discuss any questions you have with your health care provider.   Document Released: 12/20/2000 Document Revised: 01/28/2015 Document Reviewed: 12/25/2011 Elsevier Interactive Patient Education 2016 Elsevier Inc.  

## 2015-11-10 NOTE — Progress Notes (Signed)
Subjective:     Sandra Wong is a 30 y.o. [phone] female who presents for a postpartum visit. She is 8 weeks postpartum following a spontaneous vaginal delivery. I have fully reviewed the prenatal and intrapartum course. The delivery was at 40.1 gestational weeks. Postpartum course has been uncomplicated. Baby's course has been uncomplicated. Baby is feeding by both breast and bottle - Similac Advance. Bleeding no bleeding. Bowel function is normal. Bladder function is normal. Patient is sexually active. Contraception method is condoms. Patient is interested in BTL. Postpartum depression screening: negative.  The following portions of the patient's history were reviewed and updated as appropriate: allergies, current medications, past family history, past medical history, past social history, past surgical history and problem list.  Normal pap smear.   Review of Systems Pertinent items noted in HPI and remainder of comprehensive ROS otherwise negative.   Objective:    BP 120/63 mmHg  Pulse 89  Temp(Src) 97.8 F (36.6 C) (Oral)  Ht  (1.753 m)  Wt 280 lb (127.007 kg)  BMI 41.33 kg/m2  LMP 11/08/2015  Breastfeeding? Yes  General:  alert and no distress   Breasts:  inspection negative, no nipple discharge or bleeding, no masses or nodularity palpable  Lungs: clear to auscultation bilaterally  Heart:  regular rate and rhythm  Abdomen: soft, non-tender; bowel sounds normal; no masses,  no organomegaly  Pelvic:  not evaluated        Assessment:   Normal postpartum exam. Pap smear not done at today's visit.   Plan:   1. Contraception: condoms for now.  Desires BTL.  Patient desires permanent sterilization.  Other reversible forms of contraception were discussed with patient; she declines all other modalities. Risks of procedure discussed with patient including but not limited to: risk of regret, permanence of method, bleeding, infection, injury to surrounding organs and need for  additional procedures.  Failure risk of 1-2 % with increased risk of ectopic gestation if pregnancy occurs was also discussed with patient.  Patient verbalized understanding of these risks and wants to proceed with sterilization.    She was told that she will be contacted by our surgical scheduler regarding the time and date of her surgery; routine preoperative instructions of having nothing to eat or drink after midnight on the day prior to surgery and also coming to the hospital 1 1/2 hours prior to her time of surgery were also emphasized.  She was told she Sandra be called for a preoperative appointment about a week prior to surgery and will be given further preoperative instructions at that visit.  Routine postoperative instructions will be reviewed with the patient and her family in detail after surgery. Printed patient education handouts about the procedure was given to the patient to review at home.  Medicaid papers signed today, patient understands that surgery will be scheduled at least 30 days after the day papers are signed as per Medicaid guidelines.  In the meantime, patient will use condoms for contraception prior to surgery.  Follow up after surgery or as needed.    Jaynie Collins, MD, FACOG Attending Obstetrician & Gynecologist, [facility] and [facility]

## 2015-11-14 ENCOUNTER — Encounter (HOSPITAL_COMMUNITY): Payer: Self-pay | Admitting: *Deleted

## 2015-11-15 ENCOUNTER — Encounter: Payer: Self-pay | Admitting: *Deleted

## 2015-12-25 ENCOUNTER — Encounter (HOSPITAL_COMMUNITY): Payer: Self-pay | Admitting: *Deleted

## 2016-01-15 ENCOUNTER — Encounter (HOSPITAL_COMMUNITY): Payer: Self-pay | Admitting: Obstetrics & Gynecology

## 2016-01-15 ENCOUNTER — Encounter (HOSPITAL_COMMUNITY)
Admission: RE | Disposition: A | Discharge status: Home / Self Care | Payer: Self-pay | Source: Ambulatory Visit | Attending: Obstetrics & Gynecology

## 2016-01-15 ENCOUNTER — Ambulatory Visit (HOSPITAL_COMMUNITY): Payer: Medicaid Other | Admitting: Anesthesiology

## 2016-01-15 ENCOUNTER — Encounter (HOSPITAL_COMMUNITY): Payer: Self-pay | Admitting: Anesthesiology

## 2016-01-15 ENCOUNTER — Ambulatory Visit (HOSPITAL_COMMUNITY)
Admission: RE | Admit: 2016-01-15 | Discharge: 2016-01-15 | Disposition: A | Discharge status: Home / Self Care | Payer: Medicaid Other | Source: Ambulatory Visit | Attending: Obstetrics & Gynecology | Admitting: Obstetrics & Gynecology

## 2016-01-15 ENCOUNTER — Encounter: Payer: Self-pay | Admitting: Obstetrics & Gynecology

## 2016-01-15 DIAGNOSIS — G4730 Sleep apnea, unspecified: Secondary | ICD-10-CM | POA: Insufficient documentation

## 2016-01-15 DIAGNOSIS — F329 Major depressive disorder, single episode, unspecified: Secondary | ICD-10-CM | POA: Insufficient documentation

## 2016-01-15 DIAGNOSIS — F17210 Nicotine dependence, cigarettes, uncomplicated: Secondary | ICD-10-CM | POA: Insufficient documentation

## 2016-01-15 DIAGNOSIS — F419 Anxiety disorder, unspecified: Secondary | ICD-10-CM | POA: Diagnosis not present

## 2016-01-15 DIAGNOSIS — Z302 Encounter for sterilization: Secondary | ICD-10-CM | POA: Diagnosis not present

## 2016-01-15 HISTORY — PX: LAPAROSCOPIC TUBAL LIGATION: SHX1937

## 2016-01-15 LAB — PREGNANCY, URINE: PREG TEST UR: NEGATIVE

## 2016-01-15 LAB — CBC
HEMATOCRIT: 37.6 % (ref 36.0–46.0)
Hemoglobin: 12.1 g/dL (ref 12.0–15.0)
MCH: 26.7 pg (ref 26.0–34.0)
MCHC: 32.2 g/dL (ref 30.0–36.0)
MCV: 82.8 fL (ref 78.0–100.0)
PLATELETS: 221 10*3/uL (ref 150–400)
RBC: 4.54 MIL/uL (ref 3.87–5.11)
RDW: 16.1 % — AB (ref 11.5–15.5)
WBC: 9 10*3/uL (ref 4.0–10.5)

## 2016-01-15 SURGERY — LIGATION, FALLOPIAN TUBE, LAPAROSCOPIC
Anesthesia: General | Site: Abdomen | Laterality: Bilateral

## 2016-01-15 MED ORDER — MEPERIDINE HCL 25 MG/ML IJ SOLN: 6.2500 mg | INTRAMUSCULAR | Status: DC | PRN

## 2016-01-15 MED ORDER — LACTATED RINGERS IV SOLN
INTRAVENOUS | Status: DC
  Administered 2016-01-15: 12:00:00 via INTRAVENOUS

## 2016-01-15 MED ORDER — DEXAMETHASONE SODIUM PHOSPHATE 10 MG/ML IJ SOLN
INTRAMUSCULAR | Status: DC | PRN
  Administered 2016-01-15: 4 mg via INTRAVENOUS

## 2016-01-15 MED ORDER — PROPOFOL 10 MG/ML IV BOLUS
INTRAVENOUS | Status: DC | PRN
  Administered 2016-01-15: 200 mg via INTRAVENOUS

## 2016-01-15 MED ORDER — OXYCODONE-ACETAMINOPHEN 5-325 MG PO TABS
1.0000 | ORAL_TABLET | Freq: Once | ORAL | Status: AC
  Administered 2016-01-15: 1 via ORAL

## 2016-01-15 MED ORDER — HYDROMORPHONE HCL 1 MG/ML IJ SOLN: 0.2500 mg | INTRAMUSCULAR | Status: DC | PRN

## 2016-01-15 MED ORDER — SUGAMMADEX SODIUM 500 MG/5ML IV SOLN
INTRAVENOUS | Status: AC
  Filled 2016-01-15: qty 5

## 2016-01-15 MED ORDER — ROCURONIUM BROMIDE 100 MG/10ML IV SOLN
INTRAVENOUS | Status: AC
  Filled 2016-01-15: qty 1

## 2016-01-15 MED ORDER — DEXAMETHASONE SODIUM PHOSPHATE 4 MG/ML IJ SOLN
INTRAMUSCULAR | Status: AC
  Filled 2016-01-15: qty 1

## 2016-01-15 MED ORDER — DOCUSATE SODIUM 100 MG PO CAPS: 100.0000 mg | ORAL_CAPSULE | Freq: Two times a day (BID) | ORAL | Status: DC | PRN

## 2016-01-15 MED ORDER — BUPIVACAINE HCL (PF) 0.5 % IJ SOLN
INTRAMUSCULAR | Status: AC
  Filled 2016-01-15: qty 30

## 2016-01-15 MED ORDER — OXYCODONE-ACETAMINOPHEN 5-325 MG PO TABS
ORAL_TABLET | ORAL | Status: AC
  Administered 2016-01-15: 1 via ORAL
  Filled 2016-01-15: qty 1

## 2016-01-15 MED ORDER — LIDOCAINE HCL (CARDIAC) 20 MG/ML IV SOLN
INTRAVENOUS | Status: DC | PRN
  Administered 2016-01-15: 30 mg via INTRAVENOUS

## 2016-01-15 MED ORDER — PROCHLORPERAZINE EDISYLATE 5 MG/ML IJ SOLN
10.0000 mg | INTRAMUSCULAR | Status: DC | PRN
  Filled 2016-01-15: qty 2

## 2016-01-15 MED ORDER — MIDAZOLAM HCL 2 MG/2ML IJ SOLN
INTRAMUSCULAR | Status: AC
Start: 2016-01-15 — End: 2016-01-15
  Filled 2016-01-15: qty 2

## 2016-01-15 MED ORDER — ONDANSETRON HCL 4 MG/2ML IJ SOLN
INTRAMUSCULAR | Status: AC
  Filled 2016-01-15: qty 2

## 2016-01-15 MED ORDER — KETOROLAC TROMETHAMINE 30 MG/ML IJ SOLN
INTRAMUSCULAR | Status: AC
  Filled 2016-01-15: qty 1

## 2016-01-15 MED ORDER — MIDAZOLAM HCL 2 MG/2ML IJ SOLN
INTRAMUSCULAR | Status: DC | PRN
  Administered 2016-01-15: 1 mg via INTRAVENOUS

## 2016-01-15 MED ORDER — LACTATED RINGERS IV SOLN: INTRAVENOUS | Status: DC

## 2016-01-15 MED ORDER — KETOROLAC TROMETHAMINE 30 MG/ML IJ SOLN
INTRAMUSCULAR | Status: DC | PRN
  Administered 2016-01-15: 30 mg via INTRAVENOUS

## 2016-01-15 MED ORDER — FENTANYL CITRATE (PF) 100 MCG/2ML IJ SOLN
INTRAMUSCULAR | Status: DC | PRN
  Administered 2016-01-15 (×3): 50 ug via INTRAVENOUS

## 2016-01-15 MED ORDER — PROPOFOL 10 MG/ML IV BOLUS
INTRAVENOUS | Status: AC
  Filled 2016-01-15: qty 20

## 2016-01-15 MED ORDER — SUGAMMADEX SODIUM 500 MG/5ML IV SOLN
INTRAVENOUS | Status: DC | PRN
  Administered 2016-01-15: 240.4 mg via INTRAVENOUS

## 2016-01-15 MED ORDER — FENTANYL CITRATE (PF) 250 MCG/5ML IJ SOLN
INTRAMUSCULAR | Status: AC
Start: 2016-01-15 — End: 2016-01-15
  Filled 2016-01-15: qty 5

## 2016-01-15 MED ORDER — IBUPROFEN 600 MG PO TABS: 600.0000 mg | ORAL_TABLET | Freq: Four times a day (QID) | ORAL | Status: DC | PRN

## 2016-01-15 MED ORDER — PROCHLORPERAZINE EDISYLATE 5 MG/ML IJ SOLN: 10.0000 mg | INTRAMUSCULAR | Status: DC | PRN

## 2016-01-15 MED ORDER — ONDANSETRON HCL 4 MG/2ML IJ SOLN
INTRAMUSCULAR | Status: DC | PRN
  Administered 2016-01-15: 4 mg via INTRAVENOUS

## 2016-01-15 MED ORDER — OXYCODONE-ACETAMINOPHEN 5-325 MG PO TABS: 1.0000 | ORAL_TABLET | Freq: Four times a day (QID) | ORAL | Status: DC | PRN

## 2016-01-15 MED ORDER — LIDOCAINE HCL (CARDIAC) 20 MG/ML IV SOLN
INTRAVENOUS | Status: AC
  Filled 2016-01-15: qty 5

## 2016-01-15 MED ORDER — ROCURONIUM BROMIDE 100 MG/10ML IV SOLN
INTRAVENOUS | Status: DC | PRN
  Administered 2016-01-15: 30 mg via INTRAVENOUS

## 2016-01-15 MED ORDER — SCOPOLAMINE 1 MG/3DAYS TD PT72
1.0000 | MEDICATED_PATCH | Freq: Once | TRANSDERMAL | Status: DC
  Administered 2016-01-15: 1.5 mg via TRANSDERMAL

## 2016-01-15 MED ORDER — SUGAMMADEX SODIUM 200 MG/2ML IV SOLN
INTRAVENOUS | Status: AC
Start: 2016-01-15 — End: 2016-01-15
  Filled 2016-01-15: qty 2

## 2016-01-15 MED ORDER — BUPIVACAINE HCL (PF) 0.5 % IJ SOLN
INTRAMUSCULAR | Status: DC | PRN
  Administered 2016-01-15: 2 mL
  Administered 2016-01-15: 28 mL

## 2016-01-15 MED ORDER — SCOPOLAMINE 1 MG/3DAYS TD PT72
MEDICATED_PATCH | TRANSDERMAL | Status: AC
  Administered 2016-01-15: 1.5 mg via TRANSDERMAL
  Filled 2016-01-15: qty 1

## 2016-01-15 SURGICAL SUPPLY — 17 items
CATH ROBINSON RED A/P 16FR (CATHETERS) ×3 IMPLANT
CLIP FILSHIE TUBAL LIGA STRL (Clip) ×3 IMPLANT
CLOTH BEACON ORANGE TIMEOUT ST (SAFETY) ×3 IMPLANT
DRSG OPSITE POSTOP 3X4 (GAUZE/BANDAGES/DRESSINGS) ×3 IMPLANT
DURAPREP 26ML APPLICATOR (WOUND CARE) ×3 IMPLANT
GLOVE BIOGEL PI IND STRL 7.0 (GLOVE) ×3 IMPLANT
GLOVE BIOGEL PI INDICATOR 7.0 (GLOVE) ×6
GLOVE ECLIPSE 7.0 STRL STRAW (GLOVE) ×3 IMPLANT
GOWN STRL REUS W/TWL LRG LVL3 (GOWN DISPOSABLE) ×6 IMPLANT
LIQUID BAND (GAUZE/BANDAGES/DRESSINGS) ×3 IMPLANT
PACK LAPAROSCOPY BASIN (CUSTOM PROCEDURE TRAY) ×3 IMPLANT
PAD TRENDELENBURG POSITION (MISCELLANEOUS) ×3 IMPLANT
SUT VIC AB 3-0 X1 27 (SUTURE) ×3 IMPLANT
SUT VICRYL 0 UR6 27IN ABS (SUTURE) ×3 IMPLANT
SYR 30ML LL (SYRINGE) ×3 IMPLANT
TOWEL OR 17X24 6PK STRL BLUE (TOWEL DISPOSABLE) ×6 IMPLANT
TROCAR XCEL NON-BLD 11X100MML (ENDOMECHANICALS) ×3 IMPLANT

## 2016-01-15 NOTE — Discharge Instructions (Signed)
Laparoscopic Tubal Ligation, Care After °Refer to this sheet in the next few weeks. These instructions provide you with information about caring for yourself after your procedure. Your health care provider may also give you more specific instructions. Your treatment has been planned according to current medical practices, but problems sometimes occur. Call your health care provider if you have any problems or questions after your procedure. °WHAT TO EXPECT AFTER THE PROCEDURE °After your procedure, it is common to have: °· Sore throat. °· Soreness at the incision site. °· Mild cramping. °· Tiredness. °· Mild nausea or vomiting. °· Shoulder pain. °HOME CARE INSTRUCTIONS °· Rest for the remainder of the day. °· Take medicines only as directed by your health care provider. These include over-the-counter medicines and prescription medicines. Do not take aspirin, which can cause bleeding. °· Over the next few days, gradually return to your normal activities and your normal diet. °· Avoid sexual intercourse for 2 weeks or as directed by your health care provider. °· Do not use tampons, and do not douche. °· Do not drive or operate heavy machinery while taking pain medicine. °· Do not lift anything that is heavier than 5 lb (2.3 kg) for 2 weeks or as directed by your health care provider. °· Do not take baths. Take showers only. Ask your health care provider when you can start taking baths. °· Take your temperature twice each day and write it down. °· Try to have help for your household needs for the first 7-10 days. °· There are many different ways to close and cover an incision, including stitches (sutures), skin glue, and adhesive strips. Follow instructions from your health care provider about: °¨ Incision care. °¨ Bandage (dressing) changes and removal. °¨ Incision closure removal. °· Check your incision area every day for signs of infection. Watch for: °¨ Redness, swelling, or pain. °¨ Fluid, blood, or pus. °· Keep  all follow-up visits as directed by your health care provider. °SEEK MEDICAL CARE IF: °· You have redness, swelling, or increasing pain in your incision area. °· You have fluid, blood, or pus coming from your incision for longer than 1 day. °· You notice a bad smell coming from your incision or your dressing. °· The edges of your incision break open after the sutures have been removed. °· Your pain does not decrease after 2-3 days. °· You have a rash. °· You repeatedly become dizzy or light-headed. °· You have a reaction to your medicine. °· Your pain medicine is not helping. °· You are constipated. °SEEK IMMEDIATE MEDICAL CARE IF: °· You have a fever. °· You faint. °· You have increasing pain in your abdomen. °· You have severe pain in one or both of your shoulders. °· You have bleeding or drainage from your suture sites or your vagina after surgery. °· You have shortness of breath or have difficulty breathing. °· You have chest pain or leg pain. °· You have ongoing nausea, vomiting, or diarrhea. °  °This information is not intended to replace advice given to you by your health care provider. Make sure you discuss any questions you have with your health care provider. °  °Document Released: 04/02/2005 Document Revised: 01/28/2015 Document Reviewed: 12/25/2011 °Elsevier Interactive Patient Education ©2016 [employer] °DISCHARGE INSTRUCTIONS: Laparoscopy ° °The following instructions have been prepared to help you care for yourself upon your return home today. ° °Wound care: °• Do not get the incision wet for the first 24 hours. The incision should be kept   clean and dry.  The Band-Aids or dressings may be removed the day after surgery.  Should the incision become sore, red, and swollen after the first week, check with your doctor.  Personal hygiene:  Shower the day after your procedure.  Activity and limitations:  Do NOT drive or operate any equipment today.  Do NOT lift anything more than 15 pounds  for 2-3 weeks after surgery.  Do NOT rest in bed all day.  Walking is encouraged. Walk each day, starting slowly with 5-minute walks 3 or 4 times a day. Slowly increase the length of your walks.  Walk up and down stairs slowly.  Do NOT do strenuous activities, such as golfing, playing tennis, bowling, running, biking, weight lifting, gardening, mowing, or vacuuming for 2-4 weeks. Ask your doctor when it is okay to start.  Diet: Eat a light meal as desired this evening. You may resume your usual diet tomorrow.  Return to work: This is dependent on the type of work you do. For the most part you can return to a desk job within a week of surgery. If you are more active at work, please discuss this with your doctor.  What to expect after your surgery: You may have a slight burning sensation when you urinate on the first day. You may have a very small amount of blood in the urine. Expect to have a small amount of vaginal discharge/light bleeding for 1-2 weeks. It is not unusual to have abdominal soreness and bruising for up to 2 weeks. You may be tired and need more rest for about 1 week. You may experience shoulder pain for 24-72 hours. Lying flat in bed may relieve it.  Call your doctor for any of the following:  Develop a fever of 100.4 or greater  Inability to urinate 6 hours after discharge from hospital  Severe pain not relieved by pain medications  Persistent of heavy bleeding at incision site  Redness or swelling around incision site after a week  Increasing nausea or vomiting   Can take ibuprofen /Motrin/Advil at 7:30 tonight Increase water intake the next 48 hours. Can have heating pad to abdomen Patient Signature________________________________________ Nurse Signature_________________________________________

## 2016-01-15 NOTE — H&P (Signed)
Preoperative History and Physical  Sandra Wong is a 30 y.o. [id number] here for surgical management of undesired fertility.   No significant preoperative concerns.  Proposed surgery: Laparoscopic bilateral tubal sterilization using Filshie clips.   Past Medical History  Diagnosis Date  . Anxiety   . Anemia 2009    s/p delivery  . Obesity   . Abortion   . Depression   . Chronic back pain   . Sleep apnea     per patient, no study  . Headache(784.0)     no treatment occasional headache   Past Surgical History  Procedure Laterality Date  . Dilation and curettage of uterus    . Induced abortion     OB History  Gravida Para Term Preterm AB SAB TAB Ectopic Multiple Living  5 3 2 1 2  0 1 0 0 3    # Outcome Date GA Lbr Len/2nd Weight Sex Delivery Anes PTL Lv  5 Term 09/10/15 3849w1d 03:38 / 00:07 6 lb 12.6 oz (3.08 kg) M Vag-Spont None  Y  4 AB 06/2014 1326w0d    TAB     3 Preterm 01/03/14 3058w5d 01:23 2 lb 6.5 oz (1.091 kg) M Vag-Spont None  Y  2 Term 04/08/08 5469w0d  7 lb 6 oz (3.345 kg) M Vag-Spont EPI  Y     Comments: induced  1 TAB             Obstetric Comments  2015 - attempted magnesium, SROM, rapid delivery, postpartum hemorrhage - cytotec  Patient denies any other pertinent gynecologic issues.   No current facility-administered medications on file prior to encounter.   Current Outpatient Prescriptions on File Prior to Encounter  Medication Sig Dispense Refill  . sertraline (ZOLOFT) 50 MG tablet Take 1 tablet (50 mg total) by mouth daily. 60 tablet 1  . [DISCONTINUED] diphenhydrAMINE (BENADRYL) 25 MG tablet Take 1 tablet (25 mg total) by mouth every 6 (six) hours as needed. (Patient taking differently: Take 25 mg by mouth every 6 (six) hours as needed for itching, allergies or sleep. ) 30 tablet 0   Allergies  Allergen Reactions  . Other     Has an allergy to some type of eye drop but does not remember the name (in childhood)    Social History:   reports that she  has been smoking Cigarettes.  She has a .75 pack-year smoking history. She has never used smokeless tobacco. She reports that she drinks alcohol. She reports that she does not use illicit drugs.  Family History  Problem Relation Age of Onset  . Hypertension Mother   . Hypertension Paternal Aunt   . Cancer Maternal Grandfather   . Stroke Maternal Grandfather   . Hypertension Father   . Asthma Son   . Kidney disease Paternal Grandmother   . Kidney disease Paternal Grandfather     Review of Systems: Noncontributory  PHYSICAL EXAM: Height 5\' 9"  (1.753 m), weight 265 lb (120.203 kg), last menstrual period 11/25/2015, currently breastfeeding. CONSTITUTIONAL: Well-developed, well-nourished female in no acute distress.  HENT:  Normocephalic, atraumatic, External right and left ear normal. Oropharynx is clear and moist EYES: Conjunctivae and EOM are normal. Pupils are equal, round, and reactive to light. No scleral icterus.  NECK: Normal range of motion, supple, no masses SKIN: Skin is warm and dry. No rash noted. Not diaphoretic. No erythema. No pallor. NEUROLOGIC: Alert and oriented to person, place, and time. Normal reflexes, muscle tone coordination. No cranial nerve deficit noted.  PSYCHIATRIC: Normal mood and affect. Normal behavior. Normal judgment and thought content. CARDIOVASCULAR: Normal heart rate noted, regular rhythm RESPIRATORY: Effort and breath sounds normal, no problems with respiration noted ABDOMEN: Soft, obese, nontender, nondistended. PELVIC: Deferred MUSCULOSKELETAL: Normal range of motion. No edema and no tenderness. 2+ distal pulses.  Labs: Results for orders placed or performed during the hospital encounter of 01/15/16 (from the past 336 hour(s))  CBC   Collection Time: 01/15/16 11:00 AM  Result Value Ref Range   WBC 9.0 4.0 - 10.5 K/uL   RBC 4.54 3.87 - 5.11 MIL/uL   Hemoglobin 12.1 12.0 - 15.0 g/dL   HCT 04.5 40.9 - 81.1 %   MCV 82.8 78.0 - 100.0 fL   MCH  26.7 26.0 - 34.0 pg   MCHC 32.2 30.0 - 36.0 g/dL   RDW 91.4 (H) 78.2 - 95.6 %   Platelets 221 150 - 400 K/uL  Pregnancy, urine   Collection Time: 01/15/16 11:00 AM  Result Value Ref Range   Preg Test, Ur NEGATIVE NEGATIVE    Assessment: Patient Active Problem List   Diagnosis Date Noted  . Encounter for female sterilization procedure 01/15/2016  . Supervision of high-risk pregnancy 07/19/2015  . Morbid obesity (HCC) 04/29/2015  . Anxiety 03/28/2015  . Depression 03/28/2015  . Migraine with aura 06/18/2013    Plan: Patient will undergo surgical management with laparoscopic bilateral tubal sterilization using Filshie clips.   Other reversible forms of contraception were discussed with patient; she declines all other modalities. Risks of procedure discussed with patient including but not limited to: risk of regret, permanence of method, bleeding, infection, injury to surrounding organs and need for additional procedures including laparoscopy or laparotomy; thromboembolic phenomenon, surgical site problems and other postoperative/anesthesia complications. Failure risk of 1-2 % with increased risk of ectopic gestation if pregnancy occurs was also discussed with patient. Routine postoperative instructions will be reviewed with the patient and her family in detail after surgery.  The patient concurred with the proposed plan, giving informed written consent for the surgery.  Patient has been NPO since last night she will remain NPO for procedure.  Anesthesia and OR aware.  Preoperative prophylactic antibiotics and SCDs ordered on call to the OR.  To OR when ready.    Jaynie Collins, M.D. 01/15/2016 12:27 PM

## 2016-01-15 NOTE — Op Note (Signed)
Sandra Wong 01/15/2016  PREOPERATIVE DIAGNOSIS:  Undesired fertility  POSTOPERATIVE DIAGNOSIS:  Undesired fertility  PROCEDURE:  Laparoscopic Bilateral Tubal Sterilization using Filshie Clips   SURGEON: Jaynie CollinsUgonna Janeen Watson, MD  ANESTHESIA:  General endotracheal; 30 ml of 0.5% Marcaine for local analgesia  COMPLICATIONS:  None immediate.  ESTIMATED BLOOD LOSS:  5 ml.  FLUIDS: 800 ml LR.  URINE OUTPUT:  50 ml of clear urine.  INDICATIONS: 30 y.o. [id number]  with undesired fertility, desires permanent sterilization. Other reversible forms of contraception were discussed with patient; she declines all other modalities.  Risks of procedure discussed with patient including permanence of method, bleeding, infection, injury to surrounding organs and need for additional procedures including laparotomy, risk of regret.  Failure risk of 0.5-1% with increased risk of ectopic gestation if pregnancy occurs was also discussed with patient.      FINDINGS:  Normal uterus, tubes, and ovaries.  TECHNIQUE:  The patient was taken to the operating room where general anesthesia was obtained without difficulty.  She was then placed in the dorsal lithotomy position and prepared and draped in sterile fashion.  After an adequate timeout was performed, a bivalved speculum was then placed in the patient's vagina, and the anterior lip of cervix grasped with the single-tooth tenaculum.  The ZUMI uterine manipulator was then advanced into the uterus.  The speculum was removed from the vagina.  Attention was then turned to the patient's abdomen where a 11-mm skin incision was made in the umbilical fold.  The Optiview 11-mm trocar and sleeve were then advanced without difficulty with the laparoscope under direct visualization into the abdomen.  The abdomen was then insufflated with carbon dioxide gas and adequate pneumoperitoneum was obtained.  A survey of the patient's pelvis and abdomen revealed entirely normal anatomy.   The fallopian tubes were observed and found to be normal in appearance. The Filshie clip applicator was placed through the operative port, and a Filshie clip was placed on the right fallopian tube, about 2 cm from the cornual attachment, with care given to incorporate the underlying mesosalpinx.  A similar process was carried out on the contralateral side allowing for bilateral tubal sterilization.   Good hemostasis was noted overall.  Local analgesia was drizzled on both operative sites.The instruments were then removed from the patient's abdomen and the fascial incision was repaired with 0 Vicryl, and the skin was closed with Dermabond.  The uterine manipulator and the tenaculum were removed from the vagina without complications. The patient tolerated the procedure well.  Sponge, lap, and needle counts were correct times two.  The patient was then taken to the recovery room awake, extubated and in stable condition.  The patient will be discharged to home as per PACU criteria.  Routine postoperative instructions given.  She was prescribed Percocet, Ibuprofen and Colace.  She will follow up in the clinic on 02/16/2016  for postoperative evaluation.   Jaynie CollinsUGONNA  [name], MD, FACOG Attending Obstetrician & Gynecologist, [facility], [facility]

## 2016-01-15 NOTE — Anesthesia Procedure Notes (Signed)
Procedure Name: Intubation Date/Time: 01/15/2016 12:58 PM Performed by: Suella GroveMOORE, Chyan Carnero C Pre-anesthesia Checklist: Patient identified, Patient being monitored, Suction available and Emergency Drugs available Oxygen Delivery Method: Circle system utilized and Simple face mask Preoxygenation: Pre-oxygenation with 100% oxygen Intubation Type: IV induction Ventilation: Mask ventilation with difficulty LMA: LMA inserted Laryngoscope Size: Mac and 3 Grade View: Grade II Tube type: Oral Tube size: 7.0 mm Number of attempts: 1 Airway Equipment and Method: Stylet Placement Confirmation: ETT inserted through vocal cords under direct vision,  positive ETCO2 and breath sounds checked- equal and bilateral Secured at: 23 cm Tube secured with: Tape Dental Injury: Teeth and Oropharynx as per pre-operative assessment

## 2016-01-15 NOTE — Anesthesia Preprocedure Evaluation (Addendum)
Anesthesia Evaluation  Patient identified by MRN, date of birth, ID band Patient awake    Reviewed: Allergy & Precautions, NPO status , Patient's Chart, lab work & pertinent test results  Airway Mallampati: I  TM Distance: >3 FB Neck ROM: Full    Dental  (+) Teeth Intact, Dental Advisory Given   Pulmonary sleep apnea , Current Smoker,    breath sounds clear to auscultation       Cardiovascular negative cardio ROS   Rhythm:Regular Rate:Normal     Neuro/Psych  Headaches, PSYCHIATRIC DISORDERS Anxiety Depression    GI/Hepatic negative GI ROS, Neg liver ROS,   Endo/Other  negative endocrine ROS  Renal/GU negative Renal ROS  negative genitourinary   Musculoskeletal negative musculoskeletal ROS (+)   Abdominal   Peds negative pediatric ROS (+)  Hematology negative hematology ROS (+)   Anesthesia Other Findings   Reproductive/Obstetrics negative OB ROS                            Lab Results  Component Value Date   WBC 9.0 01/15/2016   HGB 12.1 01/15/2016   HCT 37.6 01/15/2016   MCV 82.8 01/15/2016   PLT 221 01/15/2016   No results found for: INR, PROTIME   Anesthesia Physical Anesthesia Plan  ASA: III  Anesthesia Plan: General   Post-op Pain Management:    Induction: Intravenous  Airway Management Planned: Oral ETT  Additional Equipment:   Intra-op Plan:   Post-operative Plan: Extubation in OR  Informed Consent: I have reviewed the patients History and Physical, chart, labs and discussed the procedure including the risks, benefits and alternatives for the proposed anesthesia with the patient or authorized representative who has indicated his/her understanding and acceptance.   Dental advisory given  Plan Discussed with: CRNA  Anesthesia Plan Comments:         Anesthesia Quick Evaluation

## 2016-01-15 NOTE — Transfer of Care (Signed)
Immediate Anesthesia Transfer of Care Note  Patient: Sandra Wong  Procedure(s) Performed: Procedure(s): LAPAROSCOPIC BILATERAL TUBAL LIGATION (Bilateral)  Patient Location: PACU  Anesthesia Type:General  Level of Consciousness: awake, alert , oriented and patient cooperative  Airway & Oxygen Therapy: Patient Spontanous Breathing and Patient connected to nasal cannula oxygen  Post-op Assessment: Report given to RN and Post -op Vital signs reviewed and stable  Post vital signs: Reviewed and stable  Last Vitals: There were no vitals filed for this visit.  Complications: No apparent anesthesia complications

## 2016-01-15 NOTE — Anesthesia Postprocedure Evaluation (Signed)
Anesthesia Post Note  Patient: Nira RetortShanikwa Revak  Procedure(s) Performed: Procedure(s) (LRB): LAPAROSCOPIC BILATERAL TUBAL LIGATION (Bilateral)  Patient location during evaluation: PACU Anesthesia Type: General Level of consciousness: awake and alert Pain management: pain level controlled Vital Signs Assessment: post-procedure vital signs reviewed and stable Respiratory status: spontaneous breathing, nonlabored ventilation, respiratory function stable and patient connected to nasal cannula oxygen Cardiovascular status: blood pressure returned to baseline and stable Postop Assessment: no signs of nausea or vomiting Anesthetic complications: no    Last Vitals:  Filed Vitals:   01/15/16 1432 01/15/16 1515  BP:  122/72  Pulse: 86 82  Temp: 36.7 C 36.7 C  Resp: 16 18    Last Pain:  Filed Vitals:   01/15/16 1539  PainSc: 3                  Cecile HearingStephen Edward Melodie Ashworth

## 2016-01-16 ENCOUNTER — Encounter (HOSPITAL_COMMUNITY): Payer: Self-pay | Admitting: Obstetrics & Gynecology

## 2016-02-16 ENCOUNTER — Encounter: Payer: Self-pay | Admitting: Obstetrics & Gynecology

## 2016-02-16 ENCOUNTER — Ambulatory Visit (INDEPENDENT_AMBULATORY_CARE_PROVIDER_SITE_OTHER): Payer: Self-pay | Admitting: Obstetrics & Gynecology

## 2016-02-16 VITALS — BP 114/76 | HR 92 | Wt 296.0 lb

## 2016-02-16 DIAGNOSIS — Z09 Encounter for follow-up examination after completed treatment for conditions other than malignant neoplasm: Secondary | ICD-10-CM

## 2016-02-16 NOTE — Patient Instructions (Signed)
Return to clinic for any scheduled appointments or for any gynecologic concerns as needed.   

## 2016-02-16 NOTE — Progress Notes (Signed)
  Subjective:     Sandra Wong is a 30 y.o. [phone] female who presents to the clinic 4 weeks status post laparoscopic BTS for requested sterilization. Eating a regular diet without difficulty. Bowel movements are normal. The patient is not having any pain.  The following portions of the patient's history were reviewed and updated as appropriate: allergies, current medications, past family history, past medical history, past social history, past surgical history and problem list.  Review of Systems Pertinent items noted in HPI and remainder of comprehensive ROS otherwise negative.    Objective:    BP 114/76 mmHg  Pulse 92  Wt 296 lb (134.265 kg) General:  alert and no distress  Abdomen: soft, bowel sounds active, non-tender, obese  Incision:   healing well, no drainage, no erythema, no hernia, no seroma, no swelling, no dehiscence, incision well approximated     Assessment:    Doing well postoperatively. Operative findings again reviewed.  Plan:   1. Continue any current medications. 2. Wound care discussed. 3. Activity restrictions: none 4. Anticipated return to work: not applicable. 5. Follow up as needed.  Jaynie CollinsUGONNA  Melquan Ernsberger, MD, FACOG Attending Obstetrician & Gynecologist, [facility]

## 2016-04-19 ENCOUNTER — Ambulatory Visit (INDEPENDENT_AMBULATORY_CARE_PROVIDER_SITE_OTHER): Payer: Self-pay

## 2016-04-19 VITALS — Wt 298.5 lb

## 2016-04-19 DIAGNOSIS — N390 Urinary tract infection, site not specified: Secondary | ICD-10-CM

## 2016-04-19 MED ORDER — SULFAMETHOXAZOLE-TRIMETHOPRIM 800-160 MG PO TABS: 1.0000 | ORAL_TABLET | Freq: Two times a day (BID) | ORAL | 0 refills | Status: DC

## 2016-04-19 NOTE — Addendum Note (Signed)
Addended by: Cheree Ditto, DEMETRICE A on: 04/19/2016 02:18 PM   Modules accepted: Orders

## 2016-04-19 NOTE — Addendum Note (Signed)
Addended by: Cheree Ditto, Lashell Moffitt A on: 04/19/2016 11:08 AM   Modules accepted: Orders

## 2016-04-19 NOTE — Progress Notes (Signed)
Patient presented today for urine check. Patient thinks she has a UTI. She is having back pain and frequent urinary pain. Patient is positive for nitrate and Leuk. I have sent off the urine to be culture. Patient has been called in [facility] per protocol.

## 2016-04-20 LAB — POCT URINALYSIS DIP (DEVICE)
BILIRUBIN URINE: NEGATIVE
Glucose, UA: NEGATIVE mg/dL
Ketones, ur: NEGATIVE mg/dL
NITRITE: POSITIVE — AB
PH: 7 (ref 5.0–8.0)
PROTEIN: 100 mg/dL — AB
Specific Gravity, Urine: >=1.03 (ref 1.005–1.030)
Urobilinogen, UA: 0.2 mg/dL (ref 0.0–1.0)

## 2016-04-21 LAB — URINE CULTURE

## 2016-07-28 IMAGING — CR DG CHEST 2V
2 series · 2 of 2 positions shown · non-contrast
Comparison: None

CLINICAL DATA: Cough, congestion shortness of breath for 2 days,
new onset sharp mid chest pain this morning, occasional smoker

EXAM:
CHEST  2 VIEW

[w chest pa]
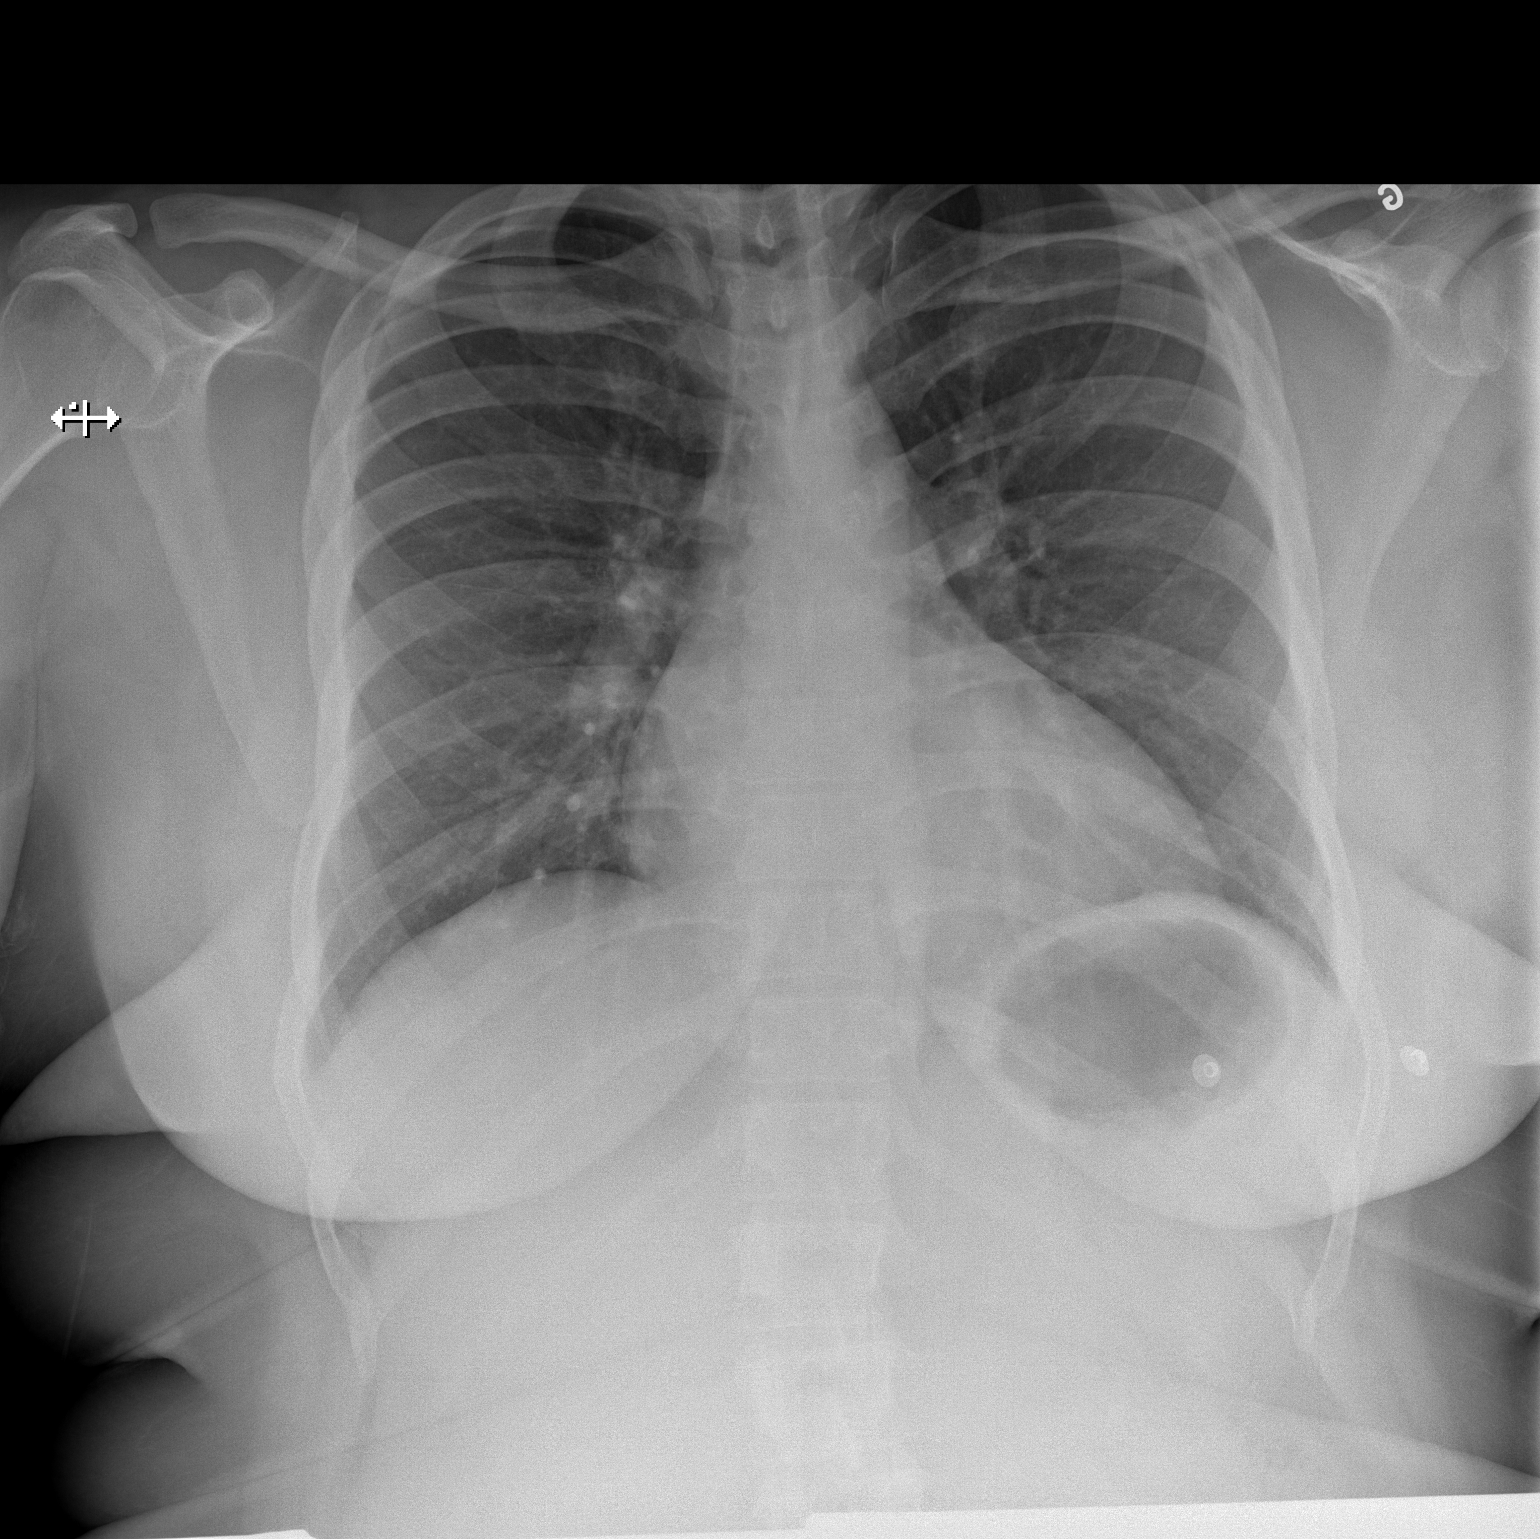

[w chest lat]
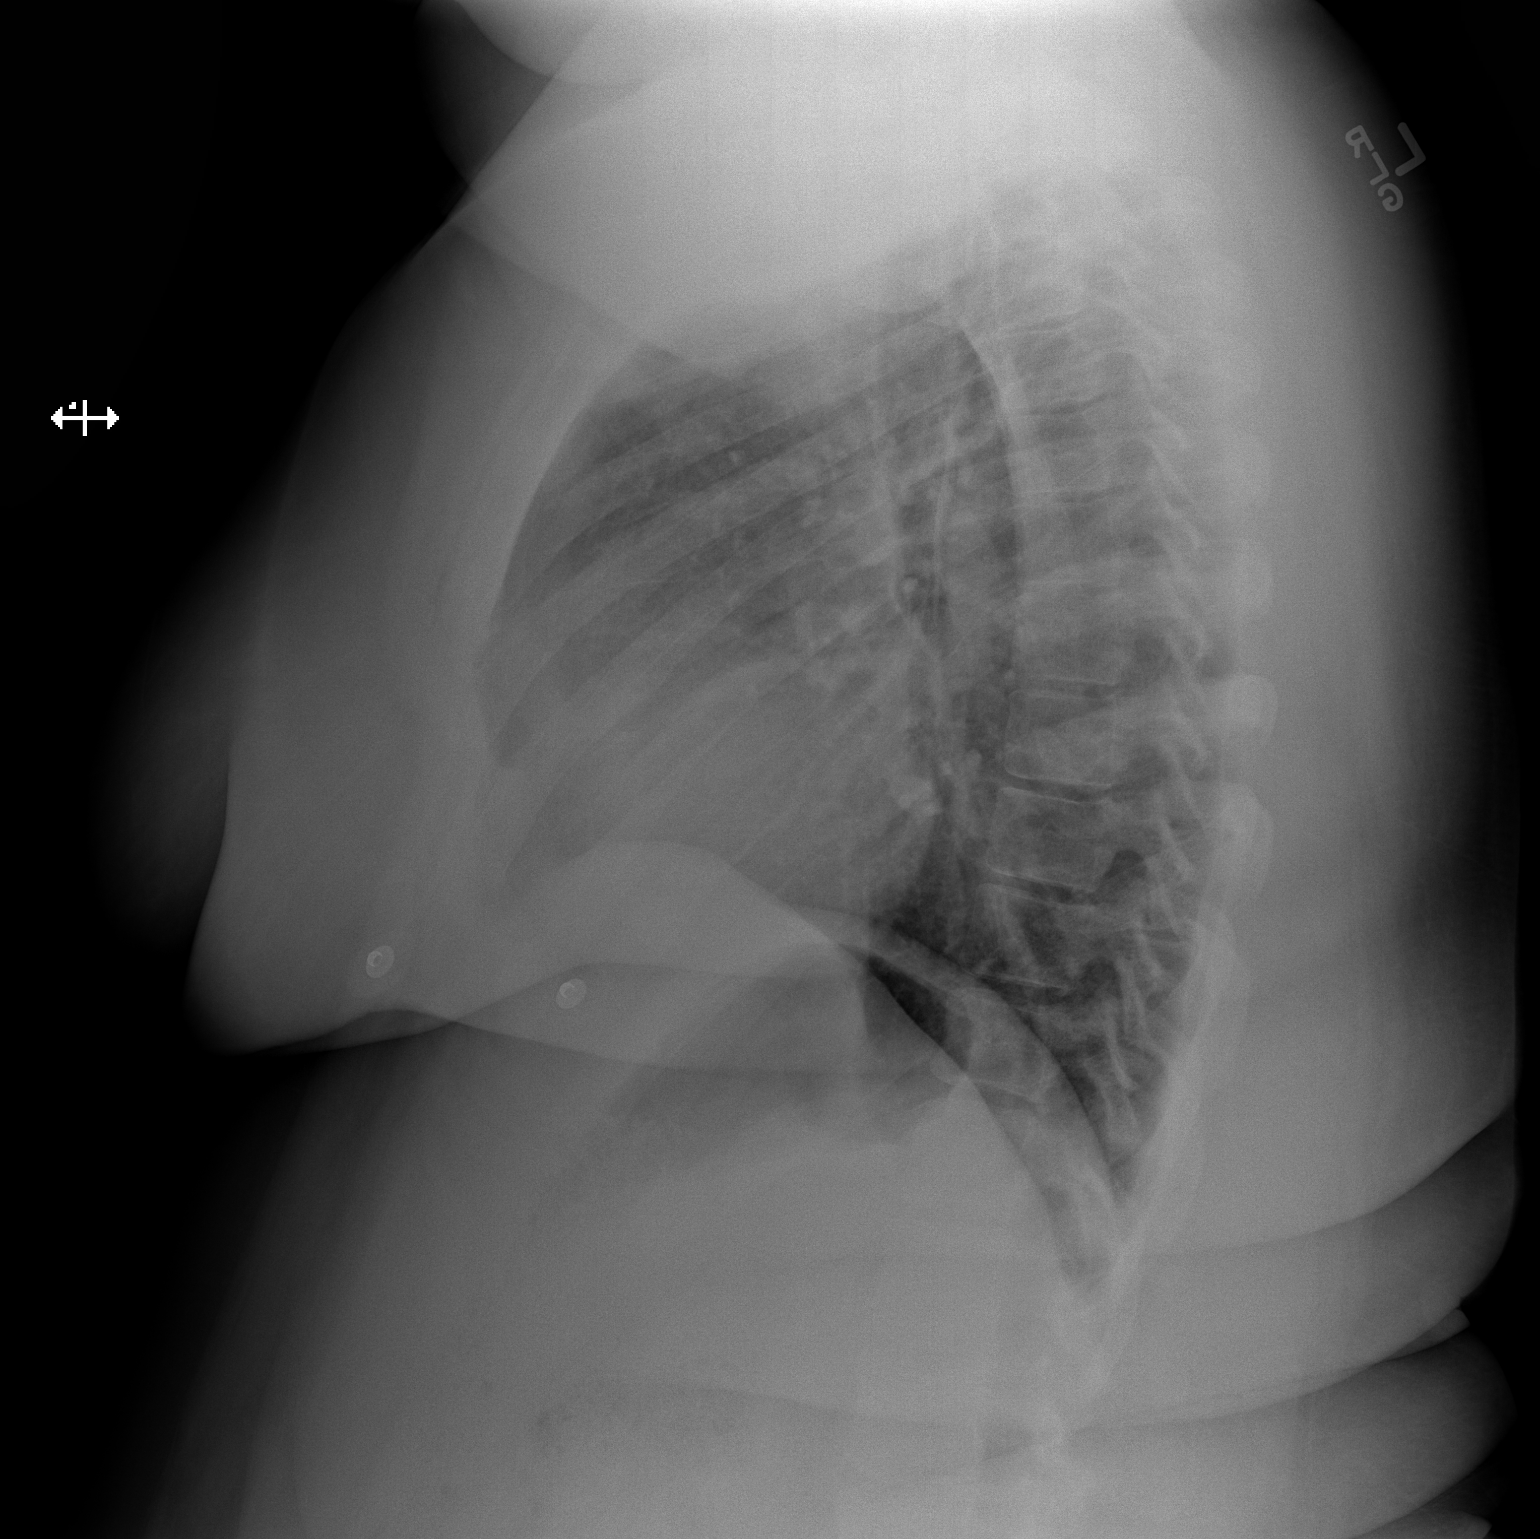

[2 of 2 positions shown; findings below may reference images not displayed]

FINDINGS: Enlargement of cardiac silhouette.

Mediastinal contours and pulmonary vascularity normal.

Lungs clear.

No pleural effusion or pneumothorax.

Bones unremarkable.
IMPRESSION: Enlargement of cardiac silhouette.

No addition abnormalities identified.

## 2016-12-08 ENCOUNTER — Ambulatory Visit (HOSPITAL_COMMUNITY)
Admission: EM | Admit: 2016-12-08 | Discharge: 2016-12-08 | Disposition: A | Discharge status: Home / Self Care | Payer: Medicaid Other | Attending: Family Medicine | Admitting: Family Medicine

## 2016-12-08 ENCOUNTER — Encounter (HOSPITAL_COMMUNITY): Payer: Self-pay | Admitting: *Deleted

## 2016-12-08 DIAGNOSIS — R0982 Postnasal drip: Secondary | ICD-10-CM

## 2016-12-08 DIAGNOSIS — J029 Acute pharyngitis, unspecified: Secondary | ICD-10-CM

## 2016-12-08 DIAGNOSIS — J069 Acute upper respiratory infection, unspecified: Secondary | ICD-10-CM

## 2016-12-08 NOTE — ED Provider Notes (Signed)
CSN: [id number]     Arrival date & time 12/08/16  1328 History   First MD Initiated Contact with Patient 12/08/16 1501     Chief Complaint  Patient presents with  . Sore   (Consider location/radiation/quality/duration/timing/severity/associated sxs/prior Treatment) 31 year old female states for little over 1 to almost 2 weeks having sore throat coming and going with change in voice coming and going, burning in the throat, sniffles and PND. She also has cough and upper respiratory congestion. Denies fever.      Past Medical History:  Diagnosis Date  . Abortion   . Anemia 2009   s/p delivery  . Anxiety   . Chronic back pain   . Depression   . Headache(784.0)    no treatment occasional headache  . Obesity   . Sleep apnea    per patient, no study   Past Surgical History:  Procedure Laterality Date  . DILATION AND CURETTAGE OF UTERUS    . INDUCED ABORTION    . LAPAROSCOPIC TUBAL LIGATION Bilateral 01/15/2016   Procedure: LAPAROSCOPIC BILATERAL TUBAL LIGATION;  Surgeon: Tereso Newcomer, MD;  Location: [facility];  Service: Gynecology;  Laterality: Bilateral;   Family History  Problem Relation Age of Onset  . Hypertension Mother   . Hypertension Paternal Aunt   . Cancer Maternal Grandfather   . Stroke Maternal Grandfather   . Hypertension Father   . Asthma Son   . Kidney disease Paternal Grandmother   . Kidney disease Paternal Grandfather    Social History  Substance Use Topics  . Smoking status: Current Some Day Smoker    Packs/day: 0.25    Years: 3.00    Types: Cigarettes    Last attempt to quit: 06/08/2013  . Smokeless tobacco: Never Used  . Alcohol use Yes     Comment: occasional when not pregnant    OB History    Gravida Para Term Preterm AB Living   5 3 2 1 2 3    SAB TAB Ectopic Multiple Live Births   0 1 0 0 3      Obstetric Comments   2015 - attempted magnesium, SROM, rapid delivery, postpartum hemorrhage - cytotec     Review of Systems  Constitutional:  Negative for activity change, appetite change, chills, fatigue and fever.  HENT: Positive for congestion, postnasal drip, rhinorrhea, sore throat and voice change. Negative for facial swelling.   Eyes: Negative.   Respiratory: Positive for cough. Negative for shortness of breath.   Cardiovascular: Negative.   Musculoskeletal: Negative for neck pain and neck stiffness.  Skin: Negative for pallor and rash.  Neurological: Negative.     Allergies  Other  Home Medications   Prior to Admission medications   Medication Sig Start Date End Date Taking? Authorizing Provider  sertraline (ZOLOFT) 50 MG tablet Take 1 tablet (50 mg total) by mouth daily. 09/11/15   Arabella Merles, CNM  sulfamethoxazole-trimethoprim (BACTRIM DS,SEPTRA DS) 800-160 MG tablet Take 1 tablet by mouth 2 (two) times daily. 04/19/16   Adam Phenix, MD   Meds Ordered and Administered this Visit  Medications - No data to display  BP 117/65 (BP Location: Right Arm)   Pulse 82   Temp 98.7 F (37.1 C)   Resp 18   SpO2 100%   Breastfeeding? No Comment: btl No data found.   Physical Exam  Constitutional: She is oriented to person, place, and time. She appears well-developed and well-nourished. No distress.  HENT:  Mouth/Throat: No oropharyngeal exudate.  Lateral TMs with minor retraction otherwise normal. Oropharynx with minimal streaky erythema and copious amount of frothy clear PND.  Eyes: EOM are normal.  Neck: Normal range of motion. Neck supple.  Cardiovascular: Normal rate, regular rhythm and normal heart sounds.   Pulmonary/Chest: Effort normal and breath sounds normal. No respiratory distress. She has no wheezes. She has no rales.  Musculoskeletal: Normal range of motion. She exhibits no edema.  Lymphadenopathy:    She has no cervical adenopathy.  Neurological: She is alert and oriented to person, place, and time.  Skin: Skin is warm and dry. No rash noted.  Psychiatric: She has a normal mood and affect.   Nursing note and vitals reviewed.   Urgent Care Course     Procedures (including critical care time)  Labs Review Labs Reviewed - No data to display  Imaging Review No results found.   Visual Acuity Review  Right Eye Distance:   Left Eye Distance:   Bilateral Distance:    Right Eye Near:   Left Eye Near:    Bilateral Near:         MDM   1. Acute URI   2. PND (post-nasal drip)   3. Sore throat    Sometimes is difficult to determine whether symptoms are due to allergies or an upper respiratory infection during allergy season. However, treatment is basically the same. The reason your throat is sore is due to all of the drainage in the back of your throat. Below his a list of medications to take to help with your symptoms. Sudafed PE 10 mg every 4 to 6 hours as needed for congestion Allegra or Zyrtec daily as needed for drainage and runny nose. For stronger antihistamine may take Chlor-Trimeton 2 to 4 mg every 4 to 6 hours, may cause drowsiness. Saline nasal spray used frequently. Ibuprofen 600 mg every 6 hours as needed for pain, discomfort or fever. Drink plenty of fluids and stay well-hydrated. Flonase or Rhinocort nasal spray daily Cepacol lozenges for sore throat    Hayden Rasmussenavid Lauris Keepers, NP 12/08/16 1525

## 2016-12-08 NOTE — Discharge Instructions (Signed)
Sometimes is difficult to determine whether symptoms are due to allergies or an upper respiratory infection during allergy season. However, treatment is basically the same. The reason your throat is sore is due to all of the drainage in the back of your throat. Below his a list of medications to take to help with your symptoms. Sudafed PE 10 mg every 4 to 6 hours as needed for congestion Allegra or Zyrtec daily as needed for drainage and runny nose. For stronger antihistamine may take Chlor-Trimeton 2 to 4 mg every 4 to 6 hours, may cause drowsiness. Saline nasal spray used frequently. Ibuprofen 600 mg every 6 hours as needed for pain, discomfort or fever. Drink plenty of fluids and stay well-hydrated. Flonase or Rhinocort nasal spray daily Cepacol lozenges for sore throat

## 2016-12-08 NOTE — ED Triage Notes (Signed)
sorethroat  X  2  Days  Has  Had  A  Cold  For  sev  Weeks   With   Cough   Congested   Burning  Sensation  In   Cough   And  Chest  X  2  Days

## 2016-12-22 ENCOUNTER — Ambulatory Visit: Payer: Medicaid Other | Admitting: Family Medicine

## 2016-12-30 ENCOUNTER — Ambulatory Visit (HOSPITAL_COMMUNITY)
Admission: EM | Admit: 2016-12-30 | Discharge: 2016-12-30 | Disposition: A | Discharge status: Home / Self Care | Payer: Medicaid Other | Attending: Emergency Medicine | Admitting: Emergency Medicine

## 2016-12-30 ENCOUNTER — Encounter (HOSPITAL_COMMUNITY): Payer: Self-pay | Admitting: Emergency Medicine

## 2016-12-30 DIAGNOSIS — B3749 Other urogenital candidiasis: Secondary | ICD-10-CM

## 2016-12-30 DIAGNOSIS — B3789 Other sites of candidiasis: Secondary | ICD-10-CM

## 2016-12-30 MED ORDER — KETOCONAZOLE 2 % EX CREA: 1.0000 "application " | TOPICAL_CREAM | Freq: Every day | CUTANEOUS | 0 refills | Status: DC

## 2016-12-30 MED ORDER — KETOCONAZOLE 2 % EX CREA: TOPICAL_CREAM | Freq: Two times a day (BID) | CUTANEOUS | Status: DC

## 2016-12-30 NOTE — ED Provider Notes (Signed)
CSN: [id number]     Arrival date & time 12/30/16  1226 History   First MD Initiated Contact with Patient 12/30/16 1258     Chief Complaint  Patient presents with  . Vaginal Itching   (Consider location/radiation/quality/duration/timing/severity/associated sxs/prior Treatment) Has itching/rash to bil inner folds of thighs for a few weeks post shaving area. Pt denies using any new soaps or detergents. Denies any urinary sx, no vaginal discharge, no lower abd pain. Has not taken anything for this .       Past Medical History:  Diagnosis Date  . Abortion   . Anemia 2009   s/p delivery  . Anxiety   . Chronic back pain   . Depression   . Headache(784.0)    no treatment occasional headache  . Obesity   . Sleep apnea    per patient, no study   Past Surgical History:  Procedure Laterality Date  . DILATION AND CURETTAGE OF UTERUS    . INDUCED ABORTION    . LAPAROSCOPIC TUBAL LIGATION Bilateral 01/15/2016   Procedure: LAPAROSCOPIC BILATERAL TUBAL LIGATION;  Surgeon: Tereso Newcomer, MD;  Location: [facility];  Service: Gynecology;  Laterality: Bilateral;   Family History  Problem Relation Age of Onset  . Hypertension Mother   . Hypertension Paternal Aunt   . Cancer Maternal Grandfather   . Stroke Maternal Grandfather   . Hypertension Father   . Asthma Son   . Kidney disease Paternal Grandmother   . Kidney disease Paternal Grandfather    Social History  Substance Use Topics  . Smoking status: Current Some Day Smoker    Packs/day: 0.25    Years: 3.00    Types: Cigarettes    Last attempt to quit: 06/08/2013  . Smokeless tobacco: Never Used  . Alcohol use Yes     Comment: occasional when not pregnant    OB History    Gravida Para Term Preterm AB Living   SAB TAB Ectopic Multiple Live Births   0 1 0 0 3      Obstetric Comments   2015 - attempted magnesium, SROM, rapid delivery, postpartum hemorrhage - cytotec     Review of Systems  Constitutional: Negative.    Respiratory: Negative.   Cardiovascular: Negative.   Gastrointestinal: Negative.   Genitourinary: Negative.   Skin:       Rash, itching to inner folds of thighs,     Allergies  Other  Home Medications   Prior to Admission medications   Medication Sig Start Date End Date Taking? Authorizing Provider  sertraline (ZOLOFT) 50 MG tablet Take 1 tablet (50 mg total) by mouth daily. 09/11/15   Arabella Merles, CNM  sulfamethoxazole-trimethoprim (BACTRIM DS,SEPTRA DS) 800-160 MG tablet Take 1 tablet by mouth 2 (two) times daily. 04/19/16   Adam Phenix, MD   Meds Ordered and Administered this Visit   Medications  ketoconazole (NIZORAL) 2 % cream (not administered)    BP 124/63 (BP Location: Right Arm)   Pulse 84   Temp 98.7 F (37.1 C) (Oral)   Resp 16   LMP 12/14/2016   SpO2 99%   Breastfeeding? No  No data found.   Physical Exam  Constitutional: She appears well-developed.  Cardiovascular: Normal rate.   Pulmonary/Chest: Effort normal.  Abdominal: Soft. Bowel sounds are normal.  Genitourinary: Vagina normal.  Skin: Rash noted.  Erythema and small amount of breaking the skin to inner thighs with itching,  Urgent Care Course     Procedures (including critical care time)  Labs Review Labs Reviewed - No data to display  Imaging Review No results found.            MDM   1. Candida rash of groin    Keep area dry, may apply to areas BID  Avoid tight clothes If sx continue will need to see pcp    Tobi Bastos, NP 12/30/16 1309

## 2016-12-30 NOTE — ED Triage Notes (Signed)
Here for vag itching onset 1 month that she noticed after shaving w/a clean razor  Denies vag d/c, urinary sx, bumps, fevers, chills, abd pain  A&O x4... NAD

## 2017-01-19 ENCOUNTER — Encounter: Payer: Self-pay | Admitting: General Practice

## 2017-01-19 ENCOUNTER — Ambulatory Visit: Payer: Medicaid Other | Admitting: Obstetrics & Gynecology

## 2017-01-19 NOTE — Progress Notes (Unsigned)
Patient no showed for appt today. Can reschedule on her own if she wants 

## 2017-01-25 ENCOUNTER — Encounter (HOSPITAL_COMMUNITY): Payer: Self-pay | Admitting: Family Medicine

## 2017-01-25 ENCOUNTER — Ambulatory Visit (HOSPITAL_COMMUNITY)
Admission: EM | Admit: 2017-01-25 | Discharge: 2017-01-25 | Disposition: A | Discharge status: Home / Self Care | Payer: Medicaid Other | Attending: Family Medicine | Admitting: Family Medicine

## 2017-01-25 DIAGNOSIS — F329 Major depressive disorder, single episode, unspecified: Secondary | ICD-10-CM | POA: Insufficient documentation

## 2017-01-25 DIAGNOSIS — F419 Anxiety disorder, unspecified: Secondary | ICD-10-CM | POA: Insufficient documentation

## 2017-01-25 DIAGNOSIS — G8929 Other chronic pain: Secondary | ICD-10-CM | POA: Insufficient documentation

## 2017-01-25 DIAGNOSIS — E669 Obesity, unspecified: Secondary | ICD-10-CM | POA: Insufficient documentation

## 2017-01-25 DIAGNOSIS — R300 Dysuria: Secondary | ICD-10-CM

## 2017-01-25 DIAGNOSIS — F17210 Nicotine dependence, cigarettes, uncomplicated: Secondary | ICD-10-CM | POA: Insufficient documentation

## 2017-01-25 DIAGNOSIS — G4730 Sleep apnea, unspecified: Secondary | ICD-10-CM | POA: Insufficient documentation

## 2017-01-25 DIAGNOSIS — M545 Low back pain: Secondary | ICD-10-CM | POA: Insufficient documentation

## 2017-01-25 DIAGNOSIS — N3001 Acute cystitis with hematuria: Secondary | ICD-10-CM | POA: Insufficient documentation

## 2017-01-25 LAB — POCT URINALYSIS DIP (DEVICE)
GLUCOSE, UA: NEGATIVE mg/dL
Nitrite: NEGATIVE
Protein, ur: >=300 mg/dL — AB
Urobilinogen, UA: 0.2 mg/dL (ref 0.0–1.0)
pH: 6 (ref 5.0–8.0)

## 2017-01-25 MED ORDER — PHENAZOPYRIDINE HCL 200 MG PO TABS: 200.0000 mg | ORAL_TABLET | Freq: Three times a day (TID) | ORAL | 0 refills | Status: DC | PRN

## 2017-01-25 MED ORDER — CEPHALEXIN 500 MG PO CAPS: 500.0000 mg | ORAL_CAPSULE | Freq: Four times a day (QID) | ORAL | 0 refills | Status: DC

## 2017-01-25 NOTE — ED Provider Notes (Signed)
CSN: [id number]     Arrival date & time 01/25/17  1326 History   First MD Initiated Contact with Patient 01/25/17 1352     Chief Complaint  Patient presents with  . Dysuria  . Hematuria   (Consider location/radiation/quality/duration/timing/severity/associated sxs/prior Treatment) 31 year old female presents to clinic with a chief complaint of dysuria, frequency, urgency, and lower back pain started this morning, along with hematuria. States she believes she has a UTI. Has no fever, nausea, abdominal pain, pelvic pain, vaginal itching, or vaginal discharge. She is not taking any over-the-counter medicines for her symptoms. Last menstrual cycle was 01/11/2017. She reports she has had her "tubes tied" and denies possibility of pregnancy.   The history is provided by the patient.  Dysuria  Pain quality:  Burning Pain severity:  Moderate Onset quality:  Gradual Duration:  1 day Timing:  Constant Progression:  Worsening Chronicity:  New Recent urinary tract infections: no   Relieved by:  None tried Worsened by:  Nothing Ineffective treatments:  None tried Urinary symptoms: discolored urine, foul-smelling urine, frequent urination and hematuria   Associated symptoms: no abdominal pain, no fever, no nausea, no vaginal discharge and no vomiting     Past Medical History:  Diagnosis Date  . Abortion   . Anemia 2009   s/p delivery  . Anxiety   . Chronic back pain   . Depression   . Headache(784.0)    no treatment occasional headache  . Obesity   . Sleep apnea    per patient, no study   Past Surgical History:  Procedure Laterality Date  . DILATION AND CURETTAGE OF UTERUS    . INDUCED ABORTION    . LAPAROSCOPIC TUBAL LIGATION Bilateral 01/15/2016   Procedure: LAPAROSCOPIC BILATERAL TUBAL LIGATION;  Surgeon: Tereso Newcomer, MD;  Location: [facility];  Service: Gynecology;  Laterality: Bilateral;   Family History  Problem Relation Age of Onset  . Hypertension Mother   . Hypertension  Paternal Aunt   . Cancer Maternal Grandfather   . Stroke Maternal Grandfather   . Hypertension Father   . Asthma Son   . Kidney disease Paternal Grandmother   . Kidney disease Paternal Grandfather    Social History  Substance Use Topics  . Smoking status: Current Some Day Smoker    Packs/day: 0.25    Years: 3.00    Types: Cigarettes    Last attempt to quit: 06/08/2013  . Smokeless tobacco: Never Used  . Alcohol use Yes     Comment: occasional when not pregnant    OB History    Gravida Para Term Preterm AB Living   SAB TAB Ectopic Multiple Live Births   0 1 0 0 3      Obstetric Comments   2015 - attempted magnesium, SROM, rapid delivery, postpartum hemorrhage - cytotec     Review of Systems  Constitutional: Negative for chills and fever.  HENT: Negative.   Respiratory: Negative.   Cardiovascular: Negative.   Gastrointestinal: Negative for abdominal pain, constipation, diarrhea, nausea and vomiting.  Genitourinary: Positive for dysuria, frequency and urgency. Negative for vaginal discharge.  Musculoskeletal: Negative.   Skin: Negative.   Neurological: Negative.     Allergies  Other  Home Medications   Prior to Admission medications   Medication Sig Start Date End Date Taking? Authorizing Provider  cephALEXin (KEFLEX) 500 MG capsule Take 1 capsule (500 mg total) by mouth 4 (four) times daily. 01/25/17   Lyman Bishop  Loretta Plume, NP  ketoconazole (NIZORAL) 2 % cream Apply 1 application topically daily. 12/30/16   Tobi Bastos, NP  phenazopyridine (PYRIDIUM) 200 MG tablet Take 1 tablet (200 mg total) by mouth 3 (three) times daily as needed for pain. 01/25/17   Dorena Bodo, NP  sertraline (ZOLOFT) 50 MG tablet Take 1 tablet (50 mg total) by mouth daily. 09/11/15   Arabella Merles, CNM  sulfamethoxazole-trimethoprim (BACTRIM DS,SEPTRA DS) 800-160 MG tablet Take 1 tablet by mouth 2 (two) times daily. 04/19/16   Adam Phenix, MD   Meds Ordered and Administered  this Visit  Medications - No data to display  BP 133/80 (BP Location: Left Wrist)   Pulse 86   Temp 98.5 F (36.9 C) (Oral)   Resp 18   LMP 01/11/2017   SpO2 99%  No data found.   Physical Exam  Constitutional: She is oriented to person, place, and time. She appears well-developed and well-nourished. No distress.  Eyes: Conjunctivae are normal.  Cardiovascular: Normal rate and regular rhythm.   Pulmonary/Chest: Effort normal and breath sounds normal.  Abdominal: Soft. Bowel sounds are normal. She exhibits no distension and no mass. There is no tenderness. There is no guarding and no CVA tenderness.  Neurological: She is alert and oriented to person, place, and time.  Skin: Skin is warm and dry. Capillary refill takes less than 2 seconds. She is not diaphoretic.  Psychiatric: She has a normal mood and affect. Her behavior is normal.  Nursing note and vitals reviewed.   Urgent Care Course     Procedures (including critical care time)  Labs Review Labs Reviewed  POCT URINALYSIS DIP (DEVICE) - Abnormal; Notable for the following:       Result Value   Bilirubin Urine SMALL (*)    Ketones, ur TRACE (*)    Hgb urine dipstick LARGE (*)    Protein, ur >=300 (*)    Leukocytes, UA SMALL (*)    All other components within normal limits  URINE CULTURE    Imaging Review No results found.     MDM   1. Acute cystitis with hematuria      Treating for UTI, prescription sent to pharmacy for Keflex, Pyridium, rupture ordered. Provided counseling with regard to follow-up guidelines. Return as needed.    Dorena Bodo, NP 01/25/17 1428

## 2017-01-25 NOTE — Discharge Instructions (Signed)
You are being treated today for a urinary tract infection. I have prescribed Keflex, take 1 tablet 4 times a day for 5 days. I have also prescribed Pyridium. Take 1 tablet a day 3 times a day for 2 days. Your urine will be sent for culture and you will be notified should any change in therapy be needed. Drink plenty of fluids and rest. Should your symptoms fail to resolve, follow up with your primary care provider or return to clinic.  °

## 2017-01-25 NOTE — ED Triage Notes (Signed)
Pt here for dysuria and hematuria since yesterday. Denise abd pain. Denies vaginal discharge or bleeding.

## 2017-01-27 LAB — URINE CULTURE

## 2017-01-31 ENCOUNTER — Emergency Department (HOSPITAL_BASED_OUTPATIENT_CLINIC_OR_DEPARTMENT_OTHER)
Admission: EM | Admit: 2017-01-31 | Discharge: 2017-01-31 | Disposition: A | Discharge status: Home / Self Care | Payer: Medicaid Other | Attending: Emergency Medicine | Admitting: Emergency Medicine

## 2017-01-31 ENCOUNTER — Encounter (HOSPITAL_BASED_OUTPATIENT_CLINIC_OR_DEPARTMENT_OTHER): Payer: Self-pay | Admitting: *Deleted

## 2017-01-31 DIAGNOSIS — R202 Paresthesia of skin: Secondary | ICD-10-CM | POA: Insufficient documentation

## 2017-01-31 DIAGNOSIS — Z792 Long term (current) use of antibiotics: Secondary | ICD-10-CM | POA: Insufficient documentation

## 2017-01-31 DIAGNOSIS — F17210 Nicotine dependence, cigarettes, uncomplicated: Secondary | ICD-10-CM | POA: Insufficient documentation

## 2017-01-31 DIAGNOSIS — M7989 Other specified soft tissue disorders: Secondary | ICD-10-CM | POA: Insufficient documentation

## 2017-01-31 NOTE — ED Triage Notes (Signed)
Pain and numbness in both feet, ankles and knees for 3 weeks constantly. No injury.

## 2017-01-31 NOTE — ED Provider Notes (Signed)
[facility] Provider Note   CSN: [id number] Arrival date & time: 01/31/17  2032  By signing my name below, I, Nelwyn Salisbury, attest that this documentation has been prepared under the direction and in the presence of non-physician practitioner, Everlene Farrier, PA-C. Electronically Signed: Nelwyn Salisbury, Scribe. 01/31/2017. 9:02 PM.  History   Chief Complaint Chief Complaint  Patient presents with  . Foot Pain  . Ankle Pain   The history is provided by the patient. No language interpreter was used.    HPI Comments:  Sandra Wong is a 31 y.o. female with no pertinent pmhx who presents to the Emergency Department complaining of constant, worsening LE swelling onset 3 weeks with associated "numbness" in her feet, worse on the right which began yesterday. No mechanism of injury indicated. No modifying factors indicated. She reports earlier she could not feel either of her feet, but now just her right foot feels numb. She reports her entire foot feels numb starting at her ankle. No numbness to her leg or calf. No pain.  She denies difficulty ambulating. No recent surgeries. No injury. Her symptoms are not worse with exertion.   Denies any fever, cough, SOB or abdominal pain.   Past Medical History:  Diagnosis Date  . Abortion   . Anemia 2009   s/p delivery  . Anxiety   . Chronic back pain   . Depression   . Headache(784.0)    no treatment occasional headache  . Obesity   . Sleep apnea    per patient, no study    Patient Active Problem List   Diagnosis Date Noted  . Morbid obesity (HCC) 04/29/2015  . Anxiety 03/28/2015  . Depression 03/28/2015  . Migraine with aura 06/18/2013    Past Surgical History:  Procedure Laterality Date  . DILATION AND CURETTAGE OF UTERUS    . INDUCED ABORTION    . LAPAROSCOPIC TUBAL LIGATION Bilateral 01/15/2016   Procedure: LAPAROSCOPIC BILATERAL TUBAL LIGATION;  Surgeon: Tereso Newcomer, MD;  Location: [facility];  Service:  Gynecology;  Laterality: Bilateral;    OB History    Gravida Para Term Preterm AB Living   5 3 2 1 2 3    SAB TAB Ectopic Multiple Live Births   0 1 0 0 3      Obstetric Comments   2015 - attempted magnesium, SROM, rapid delivery, postpartum hemorrhage - cytotec       Home Medications    Prior to Admission medications   Medication Sig Start Date End Date Taking? Authorizing Provider  cephALEXin (KEFLEX) 500 MG capsule Take 1 capsule (500 mg total) by mouth 4 (four) times daily. 01/25/17  Yes Dorena Bodo, NP  ketoconazole (NIZORAL) 2 % cream Apply 1 application topically daily. 12/30/16   Tobi Bastos, NP  phenazopyridine (PYRIDIUM) 200 MG tablet Take 1 tablet (200 mg total) by mouth 3 (three) times daily as needed for pain. 01/25/17   Dorena Bodo, NP  sertraline (ZOLOFT) 50 MG tablet Take 1 tablet (50 mg total) by mouth daily. 09/11/15   Arabella Merles, CNM  sulfamethoxazole-trimethoprim (BACTRIM DS,SEPTRA DS) 800-160 MG tablet Take 1 tablet by mouth 2 (two) times daily. 04/19/16   Adam Phenix, MD    Family History Family History  Problem Relation Age of Onset  . Hypertension Mother   . Hypertension Paternal Aunt   . Cancer Maternal Grandfather   . Stroke Maternal Grandfather   . Hypertension Father   . Asthma Son   .  Kidney disease Paternal Grandmother   . Kidney disease Paternal Grandfather     Social History Social History  Substance Use Topics  . Smoking status: Current Some Day Smoker    Packs/day: 0.25    Years: 3.00    Types: Cigarettes    Last attempt to quit: 06/08/2013  . Smokeless tobacco: Never Used  . Alcohol use Yes     Comment: occasional when not pregnant      Allergies   Other   Review of Systems Review of Systems  Constitutional: Negative for fever.  Respiratory: Negative for cough and shortness of breath.   Cardiovascular: Positive for leg swelling. Negative for chest pain.  Gastrointestinal: Negative for abdominal pain.   Musculoskeletal: Positive for joint swelling. Negative for arthralgias, back pain and gait problem.  Skin: Negative for rash and wound.  Neurological: Positive for numbness. Negative for weakness.     Physical Exam Updated Vital Signs BP 118/73 (BP Location: Left Arm)   Pulse 93   Temp 97.9 F (36.6 C) (Oral)   Resp 20   Ht 5\' 9"  (1.753 m)   Wt (!) 149.7 kg   LMP 01/11/2017   SpO2 100%   BMI 48.75 kg/m   Physical Exam  Constitutional: She appears well-developed and well-nourished. No distress.  Nontoxic appearing. Morbidly obese female.  HENT:  Head: Normocephalic and atraumatic.  Eyes: Right eye exhibits no discharge. Left eye exhibits no discharge.  Cardiovascular: Normal rate, regular rhythm and intact distal pulses.   Bilateral DP and PT pulses intact.   Pulmonary/Chest: Effort normal. No respiratory distress.  Musculoskeletal: Normal range of motion. She exhibits no edema, tenderness or deformity.  No lower extremity edema or tenderness noted. No calf tenderness. Good strength with plantar and dorsiflexion bilaterally.  No foot drop. Normal gait.   Neurological: She is alert. Coordination normal.  She reports decreased sensation with palpation of her right foot and ankle diffusely. It does not follow a dermatomal pattern. Right leg sensation is normal. Left LE sensation is normal including her leg. Normal gait. No foot drop.   Skin: Skin is warm and dry. Capillary refill takes less than 2 seconds. No rash noted. She is not diaphoretic. No erythema. No pallor.  Psychiatric: She has a normal mood and affect. Her behavior is normal.  Nursing note and vitals reviewed.   ED Treatments / Results  DIAGNOSTIC STUDIES:  Oxygen Saturation is 100% on RA, normal by my interpretation.    COORDINATION OF CARE:  9:07 PM Discussed treatment plan with pt at bedside which includes and pt agreed to plan.  Labs (all labs ordered are listed, but only abnormal results are  displayed) Labs Reviewed - No data to display  EKG  EKG Interpretation None       Radiology No results found.  Procedures Procedures (including critical care time)  Medications Ordered in ED Medications - No data to display   Initial Impression / Assessment and Plan / ED Course  I have reviewed the triage vital signs and the nursing notes.  Pertinent labs & imaging results that were available during my care of the patient were reviewed by me and considered in my medical decision making (see chart for details).    This is a 31 y.o. female with no pertinent pmhx who presents to the Emergency Department complaining of constant, worsening LE swelling onset 3 weeks with associated "numbness" in her feet, worse on the right which began yesterday. No mechanism of injury indicated. No  modifying factors indicated. She reports earlier she could not feel either of her feet, but now just her right foot feels numb. She reports her entire foot feels numb starting at her ankle. No numbness to her leg or calf. No pain.  She denies difficulty ambulating. No recent surgeries. No injury. Her symptoms are not worse with exertion.  On exam the patient is afebrile nontoxic appearing. I appreciate no lower extremity edema or tenderness bilaterally. She is good capillary refill and pulses to her bilateral lower extremities. She reports decreased sensation palpation of her right foot diffusely including her ankle. It is not follow dermatomal pattern. Her right upper and lower leg have normal sensation. Her left lower extremity including her foot has normal sensation. She has normal gait and no foot drop. This does not follow a typical neurological pattern. It is not worse exertion. I'm unsure of the cause of her decreased sensation to her right foot. I do not appreciate any edema or erythema. I have no concern for DVT at this time. I encouraged patient to ensure she is wearing proper foot wear and follow-up with  primary care. I discussed strict and specific return precautions. I advised the patient to follow-up with their primary care provider this week. I advised the patient to return to the emergency department with new or worsening symptoms or new concerns. The patient verbalized understanding and agreement with plan.     Final Clinical Impressions(s) / ED Diagnoses   Final diagnoses:  Paresthesias    New Prescriptions Discharge Medication List as of 01/31/2017  9:22 PM      I personally performed the services described in this documentation, which was scribed in my presence. The recorded information has been reviewed and is accurate.      Everlene Farrier, PA-C 01/31/17 2130    Maia Plan, MD 02/01/17 1125

## 2017-04-18 ENCOUNTER — Ambulatory Visit (HOSPITAL_COMMUNITY)
Admission: EM | Admit: 2017-04-18 | Discharge: 2017-04-18 | Disposition: A | Discharge status: Nursing Home (Includes ICF) | Payer: Medicaid Other

## 2017-04-18 ENCOUNTER — Emergency Department (HOSPITAL_COMMUNITY): Payer: Self-pay

## 2017-04-18 ENCOUNTER — Emergency Department (HOSPITAL_COMMUNITY)
Admission: EM | Admit: 2017-04-18 | Discharge: 2017-04-19 | Disposition: A | Discharge status: Home / Self Care | Payer: Self-pay | Attending: Emergency Medicine | Admitting: Emergency Medicine

## 2017-04-18 ENCOUNTER — Encounter (HOSPITAL_COMMUNITY): Payer: Self-pay | Admitting: Emergency Medicine

## 2017-04-18 DIAGNOSIS — Z79899 Other long term (current) drug therapy: Secondary | ICD-10-CM | POA: Insufficient documentation

## 2017-04-18 DIAGNOSIS — F17210 Nicotine dependence, cigarettes, uncomplicated: Secondary | ICD-10-CM | POA: Insufficient documentation

## 2017-04-18 DIAGNOSIS — D649 Anemia, unspecified: Secondary | ICD-10-CM | POA: Insufficient documentation

## 2017-04-18 DIAGNOSIS — M778 Other enthesopathies, not elsewhere classified: Secondary | ICD-10-CM

## 2017-04-18 DIAGNOSIS — R0789 Other chest pain: Secondary | ICD-10-CM

## 2017-04-18 DIAGNOSIS — M70832 Other soft tissue disorders related to use, overuse and pressure, left forearm: Secondary | ICD-10-CM | POA: Insufficient documentation

## 2017-04-18 DIAGNOSIS — Y9389 Activity, other specified: Secondary | ICD-10-CM | POA: Insufficient documentation

## 2017-04-18 LAB — BASIC METABOLIC PANEL
ANION GAP: 6 (ref 5–15)
BUN: 10 mg/dL (ref 6–20)
CALCIUM: 8.6 mg/dL — AB (ref 8.9–10.3)
CO2: 24 mmol/L (ref 22–32)
CREATININE: 0.71 mg/dL (ref 0.44–1.00)
Chloride: 104 mmol/L (ref 101–111)
Glucose, Bld: 86 mg/dL (ref 65–99)
Potassium: 3.9 mmol/L (ref 3.5–5.1)
Sodium: 134 mmol/L — ABNORMAL LOW (ref 135–145)

## 2017-04-18 LAB — CBC
HCT: 37.7 % (ref 36.0–46.0)
HEMOGLOBIN: 11.9 g/dL — AB (ref 12.0–15.0)
MCH: 25.7 pg — ABNORMAL LOW (ref 26.0–34.0)
MCHC: 31.6 g/dL (ref 30.0–36.0)
MCV: 81.4 fL (ref 78.0–100.0)
PLATELETS: 236 10*3/uL (ref 150–400)
RBC: 4.63 MIL/uL (ref 3.87–5.11)
RDW: 15.9 % — ABNORMAL HIGH (ref 11.5–15.5)
WBC: 10.4 10*3/uL (ref 4.0–10.5)

## 2017-04-18 LAB — I-STAT TROPONIN, ED: Troponin i, poc: 0.02 ng/mL (ref 0.00–0.08)

## 2017-04-18 MED ORDER — IBUPROFEN 400 MG PO TABS
400.0000 mg | ORAL_TABLET | Freq: Once | ORAL | Status: AC
  Administered 2017-04-18: 400 mg via ORAL

## 2017-04-18 MED ORDER — IBUPROFEN 400 MG PO TABS
ORAL_TABLET | ORAL | Status: AC
  Filled 2017-04-18: qty 1

## 2017-04-18 NOTE — ED Provider Notes (Signed)
TIME SEEN: 11:59 PM  CHIEF COMPLAINT: Left arm pain  HPI: Patient is a 31 year old female who is right-hand-dominant who presents emergency department with left arm pain. States pain started at 5 PM yesterday while at a cook out and she describes as a constant, nagging pain and feeling like her arm is "all cramped up". She states that her elbow is very tender to palpation and she cannot completely extend elbow without pain. She has tried 600 mg of ibuprofen at home without any relief. She states tonight when trying to go to sleep her arm was bothering her some much that it caused her to have a panic attack and she began feeling like her chest was tight and this made her jaw pain she felt short of breath. She states this is typical for panic attacks and now those symptoms are gone. No increased physical exertion using the left arm and no injury that she can recall. She works in an office at a computer all day. No numbness, tingling or focal weakness. No history of cardiac disease.  ROS: See HPI Constitutional: no fever  Eyes: no drainage  ENT: no runny nose   Cardiovascular:   chest pain  Resp:  SOB  GI: no vomiting GU: no dysuria Integumentary: no rash  Allergy: no hives  Musculoskeletal: no leg swelling  Neurological: no slurred speech ROS otherwise negative  PAST MEDICAL HISTORY/PAST SURGICAL HISTORY:  Past Medical History:  Diagnosis Date  . Abortion   . Anemia 2009   s/p delivery  . Anxiety   . Chronic back pain   . Depression   . Headache(784.0)    no treatment occasional headache  . Obesity   . Sleep apnea    per patient, no study    MEDICATIONS:  Prior to Admission medications   Medication Sig Start Date End Date Taking? Authorizing Provider  cephALEXin (KEFLEX) 500 MG capsule Take 1 capsule (500 mg total) by mouth 4 (four) times daily. 01/25/17   Dorena Bodo, NP  ketoconazole (NIZORAL) 2 % cream Apply 1 application topically daily. 12/30/16   Tobi Bastos, NP   phenazopyridine (PYRIDIUM) 200 MG tablet Take 1 tablet (200 mg total) by mouth 3 (three) times daily as needed for pain. 01/25/17   Dorena Bodo, NP  sertraline (ZOLOFT) 50 MG tablet Take 1 tablet (50 mg total) by mouth daily. 09/11/15   Arabella Merles, CNM  sulfamethoxazole-trimethoprim (BACTRIM DS,SEPTRA DS) 800-160 MG tablet Take 1 tablet by mouth 2 (two) times daily. 04/19/16   Adam Phenix, MD    ALLERGIES:  Allergies  Allergen Reactions  . Other     Has an allergy to some type of eye drop but does not remember the name (in childhood)    SOCIAL HISTORY:  Social History  Substance Use Topics  . Smoking status: Current Some Day Smoker    Packs/day: 0.25    Years: 3.00    Types: Cigarettes    Last attempt to quit: 06/08/2013  . Smokeless tobacco: Never Used  . Alcohol use Yes     Comment: occasional when not pregnant     FAMILY HISTORY: Family History  Problem Relation Age of Onset  . Hypertension Mother   . Hypertension Paternal Aunt   . Cancer Maternal Grandfather   . Stroke Maternal Grandfather   . Hypertension Father   . Asthma Son   . Kidney disease Paternal Grandmother   . Kidney disease Paternal Grandfather     EXAM: BP 113/68 (  BP Location: Right Arm)   Pulse 80   Temp (!) 97.4 F (36.3 C)   Resp 20   LMP 04/04/2017 (Approximate)   SpO2 99%   Breastfeeding? No  CONSTITUTIONAL: Alert and oriented and responds appropriately to questions. Well-appearing; well-nourished, Obese, tearful HEAD: Normocephalic EYES: Conjunctivae clear, pupils appear equal, EOMI ENT: normal nose; moist mucous membranes NECK: Supple, no meningismus, no nuchal rigidity, no LAD  CARD: RRR; S1 and S2 appreciated; no murmurs, no clicks, no rubs, no gallops RESP: Normal chest excursion without splinting or tachypnea; breath sounds clear and equal bilaterally; no wheezes, no rhonchi, no rales, no hypoxia or respiratory distress, speaking full sentences ABD/GI: Normal bowel  sounds; non-distended; soft, non-tender, no rebound, no guarding, no peritoneal signs, no hepatosplenomegaly BACK:  The back appears normal and is non-tender to palpation, there is no CVA tenderness EXT: Tender to palpation over the left elbow without significant swelling. There is no erythema, warmth or joint effusion. She has full passive range of motion in this joint but has pain with full extension of the left elbow. Full range of motion in the left shoulder, wrist and hand. She has equal grip strength bilaterally. 2+ radial pulses bilaterally. Extremities are warm and well-perfused. Compartments throughout the left arm are soft. Normal ROM in all joints; otherwise extremities are non-tender to palpation; no edema; normal capillary refill; no cyanosis, no calf tenderness or swelling    SKIN: Normal color for age and race; warm; no rash NEURO: Moves all extremities equally PSYCH: The patient's mood and manner are appropriate. Grooming and personal hygiene are appropriate.  MEDICAL DECISION MAKING: Patient here with left arm pain. I suspect that she has tendinitis. She has been given ibuprofen in the waiting room without much relief. We'll give Percocet and reassess. Patient has also had a cardiac workup which was started in the waiting room that was unremarkable. I do not think this is her anginal equivalent. Pain is completely reproducible with palpation of her arm. She states her chest tightness and shortness of breath is typical of her panic attacks she states she had a panic attack because she was worried that her arm was hurting so much at home. Her EKG shows no ischemic abnormality, arrhythmia, interval changes. Her troponin is negative. I do not think this is a PE. Doubt dissection. She is neurovascularly intact distally in the left arm. Will obtain an x-ray of the left elbow. Nothing at this time to suggest bursitis, gout, septic arthritis. Her compartments are soft.  ED PROGRESS: Patient's pain  is improved after Percocet. Her mother is on her way to pick her up from the emergency department. X-ray shows no acute abnormality. She was placed in a sling per her request for comfort but I have advised her to take her arm out of the sling regularly to move it. We have discussed stretching exercises. Recommended anti-inflammatories and will discharge her short course of Vicodin for pain control. Recommended alternating heat and ice. Will provide with work note. Discussed return precautions. Will give orthopedic follow-up as needed.   At this time, I do not feel there is any life-threatening condition present. I have reviewed and discussed all results (EKG, imaging, lab, urine as appropriate) and exam findings with patient/family. I have reviewed nursing notes and appropriate previous records.  I feel the patient is safe to be discharged home without further emergent workup and can continue workup as an outpatient as needed. Discussed usual and customary return precautions. Patient/family verbalize understanding  and are comfortable with this plan.  Outpatient follow-up has been provided if needed. All questions have been answered.      EKG Interpretation  Date/Time:  Monday April 18 2017 20:06:20 EDT Ventricular Rate:  82 PR Interval:  142 QRS Duration: 86 QT Interval:  382 QTC Calculation: 446 R Axis:   79 Text Interpretation:  Normal sinus rhythm Normal ECG Confirmed by Ward, Baxter Hire [phone]) on 04/18/2017 11:59:15 PM         Ward, Layla Maw, DO 04/19/17 4782

## 2017-04-18 NOTE — ED Triage Notes (Signed)
Patient arrives with complaint of left arm pain, SOB, and jaw tightness. Arm started yesterday, other symptoms started today. Patient was at work at onset, she works in an office. Denies injury. No family history of cardiac problems. Sensation is dulled in left hand.

## 2017-04-18 NOTE — ED Notes (Signed)
Patient states pain is worsening.  Denies any other changes.  Vitals reassessed.  Discussed POC and wait time.  Offered ibuprofen for pain.

## 2017-04-19 ENCOUNTER — Emergency Department (HOSPITAL_COMMUNITY): Payer: Self-pay

## 2017-04-19 LAB — I-STAT BETA HCG BLOOD, ED (MC, WL, AP ONLY)

## 2017-04-19 MED ORDER — HYDROCODONE-ACETAMINOPHEN 5-325 MG PO TABS: 1.0000 | ORAL_TABLET | Freq: Four times a day (QID) | ORAL | 0 refills | Status: DC | PRN

## 2017-04-19 MED ORDER — OXYCODONE-ACETAMINOPHEN 5-325 MG PO TABS
2.0000 | ORAL_TABLET | Freq: Once | ORAL | Status: AC
  Administered 2017-04-19: 2 via ORAL
  Filled 2017-04-19: qty 2

## 2017-04-19 MED ORDER — IBUPROFEN 800 MG PO TABS: 800.0000 mg | ORAL_TABLET | Freq: Three times a day (TID) | ORAL | 0 refills | Status: DC | PRN

## 2017-04-19 NOTE — Discharge Instructions (Signed)
To find a primary care or specialty doctor please call [phone] or [phone] to access "[facility] Find a Doctor Service." ° °You may also go on the [facility] website at [email or web address] ° °There are also multiple [facility], [facility], [facility] and [facility] practices throughout the [place] that are frequently accepting new patients. You may find a clinic that is close to your home and contact them. ° °[facility] -  °[address] °[place] [address] °[phone] ° ° °[facility] -  °[address] ° [place] [address] [phone] ° ° °[facility] - °[address]  °[place] [address] °[phone] ° ° °

## 2017-07-27 ENCOUNTER — Ambulatory Visit (HOSPITAL_COMMUNITY)
Admission: EM | Admit: 2017-07-27 | Discharge: 2017-07-27 | Disposition: A | Discharge status: Home / Self Care | Payer: Self-pay | Attending: Family Medicine | Admitting: Family Medicine

## 2017-07-27 ENCOUNTER — Encounter (HOSPITAL_COMMUNITY): Payer: Self-pay | Admitting: *Deleted

## 2017-07-27 DIAGNOSIS — R300 Dysuria: Secondary | ICD-10-CM

## 2017-07-27 DIAGNOSIS — R350 Frequency of micturition: Secondary | ICD-10-CM

## 2017-07-27 DIAGNOSIS — Z825 Family history of asthma and other chronic lower respiratory diseases: Secondary | ICD-10-CM | POA: Insufficient documentation

## 2017-07-27 DIAGNOSIS — Z9851 Tubal ligation status: Secondary | ICD-10-CM | POA: Insufficient documentation

## 2017-07-27 DIAGNOSIS — G8929 Other chronic pain: Secondary | ICD-10-CM | POA: Insufficient documentation

## 2017-07-27 DIAGNOSIS — N390 Urinary tract infection, site not specified: Secondary | ICD-10-CM | POA: Insufficient documentation

## 2017-07-27 DIAGNOSIS — F329 Major depressive disorder, single episode, unspecified: Secondary | ICD-10-CM | POA: Insufficient documentation

## 2017-07-27 DIAGNOSIS — Z8249 Family history of ischemic heart disease and other diseases of the circulatory system: Secondary | ICD-10-CM | POA: Insufficient documentation

## 2017-07-27 DIAGNOSIS — M549 Dorsalgia, unspecified: Secondary | ICD-10-CM | POA: Insufficient documentation

## 2017-07-27 DIAGNOSIS — F17210 Nicotine dependence, cigarettes, uncomplicated: Secondary | ICD-10-CM | POA: Insufficient documentation

## 2017-07-27 DIAGNOSIS — F419 Anxiety disorder, unspecified: Secondary | ICD-10-CM | POA: Insufficient documentation

## 2017-07-27 LAB — POCT URINALYSIS DIP (DEVICE)
BILIRUBIN URINE: NEGATIVE
Glucose, UA: NEGATIVE mg/dL
Ketones, ur: NEGATIVE mg/dL
NITRITE: NEGATIVE
PH: 6 (ref 5.0–8.0)
PROTEIN: 100 mg/dL — AB
Specific Gravity, Urine: >=1.03 (ref 1.005–1.030)
Urobilinogen, UA: 1 mg/dL (ref 0.0–1.0)

## 2017-07-27 MED ORDER — CEPHALEXIN 500 MG PO CAPS: 500.0000 mg | ORAL_CAPSULE | Freq: Four times a day (QID) | ORAL | 0 refills | Status: DC

## 2017-07-27 NOTE — ED Triage Notes (Signed)
Patient reports polyuria and dysuria. Denies abdominal pain.

## 2017-07-27 NOTE — ED Provider Notes (Signed)
[facility]    CSN: [id number] Arrival date & time: 07/27/17  1541     History   Chief Complaint Chief Complaint  Patient presents with  . Polyuria  . Dysuria    HPI Sandra Wong is a 31 y.o. female.   31 year old female complaining of meatal itching and burning with urination, dysuria, urinary frequency and occasional dribbling with urination. Symptoms started today. She notes that she has been having more symptoms of UTI recently since she had a tubal ligation.      Past Medical History:  Diagnosis Date  . Abortion   . Anemia 2009   s/p delivery  . Anxiety   . Chronic back pain   . Depression   . Headache(784.0)    no treatment occasional headache  . Obesity   . Sleep apnea    per patient, no study    Patient Active Problem List   Diagnosis Date Noted  . Morbid obesity (HCC) 04/29/2015  . Anxiety 03/28/2015  . Depression 03/28/2015  . Migraine with aura 06/18/2013    Past Surgical History:  Procedure Laterality Date  . DILATION AND CURETTAGE OF UTERUS    . INDUCED ABORTION    . LAPAROSCOPIC TUBAL LIGATION Bilateral 01/15/2016   Procedure: LAPAROSCOPIC BILATERAL TUBAL LIGATION;  Surgeon: Tereso Newcomer, MD;  Location: [facility];  Service: Gynecology;  Laterality: Bilateral;    OB History    Gravida Para Term Preterm AB Living   5 3 2 1 2 3    SAB TAB Ectopic Multiple Live Births   0 1 0 0 3      Obstetric Comments   2015 - attempted magnesium, SROM, rapid delivery, postpartum hemorrhage - cytotec       Home Medications    Prior to Admission medications   Medication Sig Start Date End Date Taking? Authorizing Provider  cephALEXin (KEFLEX) 500 MG capsule Take 1 capsule (500 mg total) by mouth 4 (four) times daily. 07/27/17   Hayden Rasmussen, NP  ibuprofen (ADVIL,MOTRIN) 800 MG tablet Take 1 tablet (800 mg total) by mouth every 8 (eight) hours as needed for mild pain. 04/19/17   Ward, Layla Maw, DO  ketoconazole (NIZORAL) 2 % cream  Apply 1 application topically daily. 12/30/16   Coralyn Mark, NP    Family History Family History  Problem Relation Age of Onset  . Hypertension Mother   . Hypertension Paternal Aunt   . Cancer Maternal Grandfather   . Stroke Maternal Grandfather   . Hypertension Father   . Asthma Son   . Kidney disease Paternal Grandmother   . Kidney disease Paternal Grandfather     Social History Social History  Substance Use Topics  . Smoking status: Current Some Day Smoker    Packs/day: 0.25    Years: 3.00    Types: Cigarettes    Last attempt to quit: 06/08/2013  . Smokeless tobacco: Never Used  . Alcohol use Yes     Comment: occasional when not pregnant      Allergies   Other   Review of Systems Review of Systems  Constitutional: Negative.   Respiratory: Negative.   Gastrointestinal: Negative.   Genitourinary: Positive for dysuria, frequency and urgency. Negative for menstrual problem, pelvic pain and vaginal bleeding.  Musculoskeletal: Negative for back pain.  Neurological: Negative.   All other systems reviewed and are negative.    Physical Exam Triage Vital Signs ED Triage Vitals  Enc Vitals Group     BP  Pulse      Resp      Temp      Temp src      SpO2      Weight      Height      Head Circumference      Peak Flow      Pain Score      Pain Loc      Pain Edu?      Excl. in GC?    No data found.   Updated Vital Signs There were no vitals taken for this visit.  Visual Acuity Right Eye Distance:   Left Eye Distance:   Bilateral Distance:    Right Eye Near:   Left Eye Near:    Bilateral Near:     Physical Exam  Constitutional: She is oriented to person, place, and time. She appears well-developed and well-nourished. No distress.  Eyes: EOM are normal.  Neck: Neck supple.  Cardiovascular: Normal rate.   Pulmonary/Chest: Effort normal. No respiratory distress.  Musculoskeletal: She exhibits no edema.  Neurological: She is alert and  oriented to person, place, and time. She exhibits normal muscle tone.  Skin: Skin is warm and dry.  Psychiatric: She has a normal mood and affect.  Nursing note and vitals reviewed.    UC Treatments / Results  Labs (all labs ordered are listed, but only abnormal results are displayed) Labs Reviewed  POCT URINALYSIS DIP (DEVICE) - Abnormal; Notable for the following:       Result Value   Hgb urine dipstick TRACE (*)    Protein, ur 100 (*)    Leukocytes, UA SMALL (*)    All other components within normal limits  URINE CULTURE    EKG  EKG Interpretation None       Radiology No results found.  Procedures Procedures (including critical care time)  Medications Ordered in UC Medications - No data to display   Initial Impression / Assessment and Plan / UC Course  I have reviewed the triage vital signs and the nursing notes.  Pertinent labs & imaging results that were available during my care of the patient were reviewed by me and considered in my medical decision making (see chart for details).    Take the antibiotics as directed. Drink plenty of fluids. May take AZO tablets that make your urine turn reddish orange, the same year taken before. This will help with [place] your symptoms. Your urine is being cultured.    Final Clinical Impressions(s) / UC Diagnoses   Final diagnoses:  Lower urinary tract infectious disease  Urinary frequency  Dysuria    New Prescriptions New Prescriptions   CEPHALEXIN (KEFLEX) 500 MG CAPSULE    Take 1 capsule (500 mg total) by mouth 4 (four) times daily.     Controlled Substance Prescriptions [place] Controlled Substance Registry consulted? Not Applicable   Hayden RasmussenMabe, Nyeisha Goodall, NP 07/27/17 [phone]

## 2017-07-27 NOTE — Discharge Instructions (Signed)
Take the antibiotics as directed. Drink plenty of fluids. May take AZO tablets that make your urine turn reddish orange, the same year taken before. This will help with [place] your symptoms. Your urine is being cultured.

## 2017-07-29 LAB — URINE CULTURE

## 2017-10-04 ENCOUNTER — Encounter (HOSPITAL_COMMUNITY): Payer: Self-pay | Admitting: Emergency Medicine

## 2017-10-04 ENCOUNTER — Other Ambulatory Visit: Payer: Self-pay

## 2017-10-04 ENCOUNTER — Ambulatory Visit (HOSPITAL_COMMUNITY)
Admission: EM | Admit: 2017-10-04 | Discharge: 2017-10-04 | Disposition: A | Discharge status: Home / Self Care | Payer: Self-pay | Attending: Internal Medicine | Admitting: Internal Medicine

## 2017-10-04 DIAGNOSIS — J020 Streptococcal pharyngitis: Secondary | ICD-10-CM

## 2017-10-04 MED ORDER — AMOXICILLIN 875 MG PO TABS: 875.0000 mg | ORAL_TABLET | Freq: Two times a day (BID) | ORAL | 0 refills | Status: DC

## 2017-10-04 MED ORDER — PREDNISONE 10 MG PO TABS: 10.0000 mg | ORAL_TABLET | Freq: Every day | ORAL | 0 refills | Status: DC

## 2017-10-04 NOTE — Discharge Instructions (Signed)
Please take antibiotics and prednisone as prescribed.  Please finish all antibiotics.  Make sure you are drinking lots of fluids.  You may take Tylenol or ibuprofen as needed for any chills or fevers.  Return to the emergency department or urgent care facility for any difficulty swallowing, worsening sore throat, fevers or urgent changes in

## 2017-10-04 NOTE — ED Triage Notes (Signed)
Pt c/o swollen tonsils and "my throat burns".

## 2017-10-04 NOTE — ED Provider Notes (Signed)
[facility]    CSN: [id number] Arrival date & time: 10/04/17  1628     History   Chief Complaint Chief Complaint  Patient presents with  . Sore Throat    HPI Sandra Wong is a 32 y.o. female presents to the urgent care facility for evaluation of sore throat.  Sore throat has been present for 2 days.  She describes chills, sore throat.  No abdominal pain, cough, runny nose or congestion.  She is tolerating p.o. well.  She has not had any Tylenol or ibuprofen.  No known history of contacts with strep.  HPI  Past Medical History:  Diagnosis Date  . Abortion   . Anemia 2009   s/p delivery  . Anxiety   . Chronic back pain   . Depression   . Headache(784.0)    no treatment occasional headache  . Obesity   . Sleep apnea    per patient, no study    Patient Active Problem List   Diagnosis Date Noted  . Morbid obesity (HCC) 04/29/2015  . Anxiety 03/28/2015  . Depression 03/28/2015  . Migraine with aura 06/18/2013    Past Surgical History:  Procedure Laterality Date  . DILATION AND CURETTAGE OF UTERUS    . INDUCED ABORTION    . LAPAROSCOPIC TUBAL LIGATION Bilateral 01/15/2016   Procedure: LAPAROSCOPIC BILATERAL TUBAL LIGATION;  Surgeon: Tereso Newcomer, MD;  Location: [facility];  Service: Gynecology;  Laterality: Bilateral;    OB History    Gravida Para Term Preterm AB Living   5 3 2 1 2 3    SAB TAB Ectopic Multiple Live Births   0 1 0 0 3      Obstetric Comments   2015 - attempted magnesium, SROM, rapid delivery, postpartum hemorrhage - cytotec       Home Medications    Prior to Admission medications   Medication Sig Start Date End Date Taking? Authorizing Provider  amoxicillin (AMOXIL) 875 MG tablet Take 1 tablet (875 mg total) by mouth 2 (two) times daily. X 10 days 10/04/17   Evon Slack, PA-C  cephALEXin (KEFLEX) 500 MG capsule Take 1 capsule (500 mg total) by mouth 4 (four) times daily. 07/27/17   Hayden Rasmussen, NP  ibuprofen  (ADVIL,MOTRIN) 800 MG tablet Take 1 tablet (800 mg total) by mouth every 8 (eight) hours as needed for mild pain. 04/19/17   Ward, Layla Maw, DO  ketoconazole (NIZORAL) 2 % cream Apply 1 application topically daily. 12/30/16   Coralyn Mark, NP  predniSONE (DELTASONE) 10 MG tablet Take 1 tablet (10 mg total) by mouth daily. 6,5,4,3,2,1 six day taper 10/04/17   Evon Slack, PA-C    Family History Family History  Problem Relation Age of Onset  . Hypertension Mother   . Hypertension Paternal Aunt   . Cancer Maternal Grandfather   . Stroke Maternal Grandfather   . Hypertension Father   . Asthma Son   . Kidney disease Paternal Grandmother   . Kidney disease Paternal Grandfather     Social History Social History   Tobacco Use  . Smoking status: Current Some Day Smoker    Packs/day: 0.25    Years: 3.00    Pack years: 0.75    Types: Cigarettes    Last attempt to quit: 06/08/2013    Years since quitting: 4.3  . Smokeless tobacco: Never Used  Substance Use Topics  . Alcohol use: Yes    Comment: occasional when not pregnant   .  Drug use: No     Allergies   Other   Review of Systems Review of Systems  Constitutional: Positive for chills. Negative for fever.  HENT: Positive for sore throat. Negative for congestion, ear discharge, rhinorrhea, sinus pressure, sinus pain, trouble swallowing and voice change.   Respiratory: Negative for cough, shortness of breath, wheezing and stridor.   Cardiovascular: Negative for chest pain.  Gastrointestinal: Negative for abdominal pain, diarrhea, nausea and vomiting.  Genitourinary: Negative for dysuria, flank pain and pelvic pain.  Musculoskeletal: Positive for myalgias. Negative for back pain.  Skin: Negative for rash.  Neurological: Negative for dizziness and headaches.     Physical Exam Triage Vital Signs ED Triage Vitals  Enc Vitals Group     BP 10/04/17 1637 (!) 122/53     Pulse Rate 10/04/17 1637 93     Resp 10/04/17 1637  18     Temp 10/04/17 1637 98 F (36.7 C)     Temp src --      SpO2 10/04/17 1637 100 %     Weight --      Height --      Head Circumference --      Peak Flow --      Pain Score 10/04/17 1639 9     Pain Loc --      Pain Edu? --      Excl. in GC? --    No data found.  Updated Vital Signs BP (!) 122/53   Pulse 93   Temp 98 F (36.7 C)   Resp 18   LMP 09/07/2017   SpO2 100%   Visual Acuity Right Eye Distance:   Left Eye Distance:   Bilateral Distance:    Right Eye Near:   Left Eye Near:    Bilateral Near:     Physical Exam  Constitutional: She is oriented to person, place, and time. She appears well-developed and well-nourished. No distress.  HENT:  Head: Normocephalic and atraumatic.  Right Ear: Hearing normal.  Left Ear: Hearing normal.  Mouth/Throat: Uvula is midline. No oral lesions. No uvula swelling. Oropharyngeal exudate and posterior oropharyngeal erythema present. No posterior oropharyngeal edema or tonsillar abscesses. Tonsils are 2+ on the right. Tonsils are 2+ on the left. Tonsillar exudate.  Eyes: Conjunctivae are normal. Right eye exhibits no discharge. Left eye exhibits no discharge.  Neck: Normal range of motion.  Cardiovascular: Normal rate.  Pulmonary/Chest: No respiratory distress.  Abdominal: Soft. There is no tenderness.  Musculoskeletal: Normal range of motion. She exhibits no deformity.  Lymphadenopathy:    She has cervical adenopathy (anterior).  Neurological: She is alert and oriented to person, place, and time. She has normal reflexes.  Skin: Skin is warm and dry.  Psychiatric: She has a normal mood and affect. Her behavior is normal. Thought content normal.     UC Treatments / Results  Labs (all labs ordered are listed, but only abnormal results are displayed) Labs Reviewed - No data to display  EKG  EKG Interpretation None       Radiology No results found.  Procedures Procedures (including critical care  time)  Medications Ordered in UC Medications - No data to display   Initial Impression / Assessment and Plan / UC Course  I have reviewed the triage vital signs and the nursing notes.  Pertinent labs & imaging results that were available during my care of the patient were reviewed by me and considered in my medical decision making (see chart for  details).     32 year old female with positive pharyngeal erythema, exudates, sore throat, chills.  She is without cough.  No signs of peritonsillar abscess no abdominal pain.  High probability for streptococcal infection.  We will go ahead and treat with amoxicillin.  She is given prednisone to help with inflammation and encouraged to drink lots of fluids.  She is educated on signs and symptoms return to the clinic for.  Final Clinical Impressions(s) / UC Diagnoses   Final diagnoses:  Acute streptococcal pharyngitis    ED Discharge Orders        Ordered    amoxicillin (AMOXIL) 875 MG tablet  2 times daily     10/04/17 1716    predniSONE (DELTASONE) 10 MG tablet  Daily     10/04/17 1716         Evon Slack, PA-C 10/04/17 1724

## 2018-01-12 ENCOUNTER — Emergency Department (HOSPITAL_COMMUNITY)
Admission: EM | Admit: 2018-01-12 | Discharge: 2018-01-12 | Disposition: A | Discharge status: Home / Self Care | Payer: Self-pay | Attending: Emergency Medicine | Admitting: Emergency Medicine

## 2018-01-12 ENCOUNTER — Other Ambulatory Visit: Payer: Self-pay

## 2018-01-12 ENCOUNTER — Encounter (HOSPITAL_COMMUNITY): Payer: Self-pay | Admitting: *Deleted

## 2018-01-12 DIAGNOSIS — F17210 Nicotine dependence, cigarettes, uncomplicated: Secondary | ICD-10-CM | POA: Insufficient documentation

## 2018-01-12 DIAGNOSIS — Z79899 Other long term (current) drug therapy: Secondary | ICD-10-CM | POA: Insufficient documentation

## 2018-01-12 DIAGNOSIS — R55 Syncope and collapse: Secondary | ICD-10-CM | POA: Insufficient documentation

## 2018-01-12 LAB — URINALYSIS, ROUTINE W REFLEX MICROSCOPIC
BACTERIA UA: NONE SEEN
Bilirubin Urine: NEGATIVE
Glucose, UA: NEGATIVE mg/dL
Hgb urine dipstick: NEGATIVE
Ketones, ur: NEGATIVE mg/dL
Leukocytes, UA: NEGATIVE
Nitrite: NEGATIVE
PROTEIN: 100 mg/dL — AB
Specific Gravity, Urine: 1.02 (ref 1.005–1.030)
pH: 6 (ref 5.0–8.0)

## 2018-01-12 LAB — BASIC METABOLIC PANEL
Anion gap: 8 (ref 5–15)
BUN: 9 mg/dL (ref 6–20)
CALCIUM: 8.9 mg/dL (ref 8.9–10.3)
CO2: 25 mmol/L (ref 22–32)
Chloride: 103 mmol/L (ref 101–111)
Creatinine, Ser: 0.71 mg/dL (ref 0.44–1.00)
GFR calc Af Amer: >60 mL/min (ref >60)
GFR calc non Af Amer: >60 mL/min (ref >60)
GLUCOSE: 80 mg/dL (ref 65–99)
POTASSIUM: 4 mmol/L (ref 3.5–5.1)
Sodium: 136 mmol/L (ref 135–145)

## 2018-01-12 LAB — CBC
HEMATOCRIT: 38.1 % (ref 36.0–46.0)
HEMOGLOBIN: 12 g/dL (ref 12.0–15.0)
MCH: 26.4 pg (ref 26.0–34.0)
MCHC: 31.5 g/dL (ref 30.0–36.0)
MCV: 83.7 fL (ref 78.0–100.0)
Platelets: 262 10*3/uL (ref 150–400)
RBC: 4.55 MIL/uL (ref 3.87–5.11)
RDW: 15 % (ref 11.5–15.5)
WBC: 8.3 10*3/uL (ref 4.0–10.5)

## 2018-01-12 LAB — I-STAT BETA HCG BLOOD, ED (MC, WL, AP ONLY)

## 2018-01-12 NOTE — ED Provider Notes (Signed)
[facility] EMERGENCY DEPARTMENT Provider Note   CSN: [id number] Arrival date & time: 01/12/18  1212     History   Chief Complaint Chief Complaint  Patient presents with  . Loss of Consciousness    HPI Sandra Wong is a 32 y.o. female.  HPI   32 year old female presents today with complaints of near syncopal episode.  Patient notes that she was at work around 10:30 AM.  She notes she felt a hot flash felt short of breath and anxious.  She notes that coworkers reports she passed out, but she denies alcohol passing out or falling.  EMS was called patient was brought to the emergency room.  She notes complete resolution of her symptoms upon arrival to the emergency room.  She reports she felt her heart racing when this was going on, she denied any acute chest pain.  She denies any recent infections, vomiting, abdominal pain.  Patient reports a history of anxiety attacks with similar presentations in the past.  No history DVT or PE, no cardiac history.    Past Medical History:  Diagnosis Date  . Abortion   . Anemia 2009   s/p delivery  . Anxiety   . Chronic back pain   . Depression   . Headache(784.0)    no treatment occasional headache  . Obesity   . Sleep apnea    per patient, no study    Patient Active Problem List   Diagnosis Date Noted  . Morbid obesity (HCC) 04/29/2015  . Anxiety 03/28/2015  . Depression 03/28/2015  . Migraine with aura 06/18/2013    Past Surgical History:  Procedure Laterality Date  . DILATION AND CURETTAGE OF UTERUS    . INDUCED ABORTION    . LAPAROSCOPIC TUBAL LIGATION Bilateral 01/15/2016   Procedure: LAPAROSCOPIC BILATERAL TUBAL LIGATION;  Surgeon: Tereso Newcomer, MD;  Location: [facility];  Service: Gynecology;  Laterality: Bilateral;     OB History    Gravida  5   Para  3   Term  2   Preterm  1   AB  2   Living  3     SAB  0   TAB  1   Ectopic  0   Multiple  0   Live Births  3        Obstetric Comments  2015 - attempted magnesium, SROM, rapid delivery, postpartum hemorrhage - cytotec         Home Medications    Prior to Admission medications   Medication Sig Start Date End Date Taking? Authorizing Provider  Multiple Vitamin (MULTIVITAMIN WITH MINERALS) TABS tablet Take 1 tablet by mouth daily.   Yes [provider]  amoxicillin (AMOXIL) 875 MG tablet Take 1 tablet (875 mg total) by mouth 2 (two) times daily. X 10 days Patient not taking: Reported on 01/12/2018 10/04/17   Evon Slack, PA-C  cephALEXin (KEFLEX) 500 MG capsule Take 1 capsule (500 mg total) by mouth 4 (four) times daily. Patient not taking: Reported on 01/12/2018 07/27/17   Hayden Rasmussen, NP  ibuprofen (ADVIL,MOTRIN) 800 MG tablet Take 1 tablet (800 mg total) by mouth every 8 (eight) hours as needed for mild pain. Patient not taking: Reported on 01/12/2018 04/19/17   Ward, Layla Maw, DO  ketoconazole (NIZORAL) 2 % cream Apply 1 application topically daily. Patient not taking: Reported on 01/12/2018 12/30/16   Coralyn Mark, NP  predniSONE (DELTASONE) 10 MG tablet Take 1 tablet (10 mg total) by  mouth daily. 6,5,4,3,2,1 six day taper Patient not taking: Reported on 01/12/2018 10/04/17   Evon Slack, PA-C  diphenhydrAMINE (BENADRYL) 25 MG tablet Take 1 tablet (25 mg total) by mouth every 6 (six) hours as needed. Patient taking differently: Take 25 mg by mouth every 6 (six) hours as needed for itching, allergies or sleep.  07/10/14 01/15/15  Linna Hoff, MD    Family History Family History  Problem Relation Age of Onset  . Hypertension Mother   . Hypertension Paternal Aunt   . Cancer Maternal Grandfather   . Stroke Maternal Grandfather   . Hypertension Father   . Asthma Son   . Kidney disease Paternal Grandmother   . Kidney disease Paternal Grandfather     Social History Social History   Tobacco Use  . Smoking status: Current Some Day Smoker    Packs/day: 0.25    Years: 3.00     Pack years: 0.75    Types: Cigarettes    Last attempt to quit: 06/08/2013    Years since quitting: 4.6  . Smokeless tobacco: Never Used  Substance Use Topics  . Alcohol use: Yes    Comment: occasional when not pregnant   . Drug use: No     Allergies   Other   Review of Systems Review of Systems  All other systems reviewed and are negative.    Physical Exam Updated Vital Signs BP 120/70 (BP Location: Right Arm)   Pulse 81   Temp 98.6 F (37 C) (Oral)   Resp 16   LMP 12/29/2017   SpO2 98%   Physical Exam  Constitutional: She is oriented to person, place, and time. She appears well-developed and well-nourished.  HENT:  Head: Normocephalic and atraumatic.  Eyes: Pupils are equal, round, and reactive to light. Conjunctivae are normal. Right eye exhibits no discharge. Left eye exhibits no discharge. No scleral icterus.  Neck: Normal range of motion. No JVD present. No tracheal deviation present.  Cardiovascular: Normal rate, regular rhythm, normal heart sounds and intact distal pulses. Exam reveals no gallop and no friction rub.  No murmur heard. Pulmonary/Chest: Effort normal and breath sounds normal. No stridor. No respiratory distress. She has no wheezes. She has no rales. She exhibits no tenderness.  Musculoskeletal: She exhibits no edema.  Neurological: She is alert and oriented to person, place, and time. Coordination normal.  Psychiatric: She has a normal mood and affect. Her behavior is normal. Judgment and thought content normal.  Nursing note and vitals reviewed.   ED Treatments / Results  Labs (all labs ordered are listed, but only abnormal results are displayed) Labs Reviewed  URINALYSIS, ROUTINE W REFLEX MICROSCOPIC - Abnormal; Notable for the following components:      Result Value   Protein, ur 100 (*)    Squamous Epithelial / LPF 0-5 (*)    All other components within normal limits  BASIC METABOLIC PANEL  CBC  I-STAT BETA HCG BLOOD, ED ([facility], [facility], [facility]  ONLY)  CBG MONITORING, ED    EKG None  Radiology No results found.  Procedures Procedures (including critical care time)  Medications Ordered in ED Medications - No data to display   Initial Impression / Assessment and Plan / ED Course  I have reviewed the triage vital signs and the nursing notes.  Pertinent labs & imaging results that were available during my care of the patient were reviewed by me and considered in my medical decision making (see chart for details).  Final Clinical Impressions(s) / ED Diagnoses   Final diagnoses:  Near syncope   Labs: I-STAT beta-hCG, urinalysis,  BMP, CBC  Imaging: ED EKG   Consults:  Therapeutics:  Discharge Meds:   Assessment/Plan: 32 year old female presents today with near syncopal episode.  I have high suspicion for panic attack in this patient, question vasovagal.  I have very low suspicion for any cardiac or pulmonary etiology including PE, tachyarrhythmia, or any other life-threatening etiology.  Patient has been remained asymptomatic throughout her stay here in the ED.  She has reassuring vital signs and reassuring laboratory analysis.  Patient's EKG is normal with no acute abnormalities.  She will be discharged with outpatient follow-up and strict return precautions.  She verbalized understanding and agreement to today's plan had no further questions or concerns at the time of discharge.    ED Discharge Orders    None       Rosalio LoudHedges, Mannat Benedetti, PA-C 01/12/18 1742    Loren RacerYelverton, David, MD 01/12/18 2141

## 2018-01-12 NOTE — ED Triage Notes (Signed)
Pt reports onset while at work today of feeling confused, was feeling lightheaded and sweating. Coworkers stated she passed out several times. Denies any pain at this time, only fatigue. No acute distress is noted at triage.

## 2018-01-12 NOTE — Discharge Instructions (Addendum)
Please read attached information. If you experience any new or worsening signs or symptoms please return to the emergency room for evaluation. Please follow-up with your primary care provider or specialist as discussed.  °

## 2018-04-05 ENCOUNTER — Ambulatory Visit (HOSPITAL_COMMUNITY)
Admission: EM | Admit: 2018-04-05 | Discharge: 2018-04-05 | Disposition: A | Discharge status: Home / Self Care | Payer: Self-pay | Attending: Family Medicine | Admitting: Family Medicine

## 2018-04-05 ENCOUNTER — Ambulatory Visit (INDEPENDENT_AMBULATORY_CARE_PROVIDER_SITE_OTHER): Payer: Self-pay

## 2018-04-05 ENCOUNTER — Encounter (HOSPITAL_COMMUNITY): Payer: Self-pay

## 2018-04-05 DIAGNOSIS — J069 Acute upper respiratory infection, unspecified: Secondary | ICD-10-CM

## 2018-04-05 MED ORDER — BENZONATATE 100 MG PO CAPS: 100.0000 mg | ORAL_CAPSULE | Freq: Three times a day (TID) | ORAL | 0 refills | Status: DC

## 2018-04-05 MED ORDER — LIDOCAINE-EPINEPHRINE (PF) 2 %-1:200000 IJ SOLN
INTRAMUSCULAR | Status: AC
  Filled 2018-04-05: qty 20

## 2018-04-05 NOTE — ED Provider Notes (Addendum)
[facility]    CSN: [id number] Arrival date & time: 04/05/18  1021     History   Chief Complaint Chief Complaint  Patient presents with  . upper respiratory symptoms, nausea, vomiting and diarrhea    HPI Tenicia Gural is a 32 y.o. female.   Patient is a 32 year old female with 2 weeks of cough, diarrhea, and blood-tinged sputum.  Reports that she vomited one time last night.  The cough is been causing her throat to be irritated.  She denies any fever, ear pain, sore throat but does admit to some cold chills at night.  She has been using Mucinex and loratadine for symptoms.  She denies any abdominal pain, nausea.  She denies any urinary or vaginal symptoms.  ROS per HPI      Past Medical History:  Diagnosis Date  . Abortion   . Anemia 2009   s/p delivery  . Anxiety   . Chronic back pain   . Depression   . Headache(784.0)    no treatment occasional headache  . Obesity   . Sleep apnea    per patient, no study    Patient Active Problem List   Diagnosis Date Noted  . Morbid obesity (HCC) 04/29/2015  . Anxiety 03/28/2015  . Depression 03/28/2015  . Migraine with aura 06/18/2013    Past Surgical History:  Procedure Laterality Date  . DILATION AND CURETTAGE OF UTERUS    . INDUCED ABORTION    . LAPAROSCOPIC TUBAL LIGATION Bilateral 01/15/2016   Procedure: LAPAROSCOPIC BILATERAL TUBAL LIGATION;  Surgeon: Tereso Newcomer, MD;  Location: [facility];  Service: Gynecology;  Laterality: Bilateral;    OB History    Gravida  5   Para  3   Term  2   Preterm  1   AB  2   Living  3     SAB  0   TAB  1   Ectopic  0   Multiple  0   Live Births  3        Obstetric Comments  2015 - attempted magnesium, SROM, rapid delivery, postpartum hemorrhage - cytotec         Home Medications    Prior to Admission medications   Medication Sig Start Date End Date Taking? Authorizing Provider  amoxicillin (AMOXIL) 875 MG tablet Take 1 tablet (875  mg total) by mouth 2 (two) times daily. X 10 days Patient not taking: Reported on 01/12/2018 10/04/17   Evon Slack, PA-C  benzonatate (TESSALON) 100 MG capsule Take 1 capsule (100 mg total) by mouth every 8 (eight) hours. 04/05/18   Karlon Schlafer, Gloris Manchester A, NP  cephALEXin (KEFLEX) 500 MG capsule Take 1 capsule (500 mg total) by mouth 4 (four) times daily. Patient not taking: Reported on 01/12/2018 07/27/17   Hayden Rasmussen, NP  ibuprofen (ADVIL,MOTRIN) 800 MG tablet Take 1 tablet (800 mg total) by mouth every 8 (eight) hours as needed for mild pain. Patient not taking: Reported on 01/12/2018 04/19/17   Ward, Layla Maw, DO  ketoconazole (NIZORAL) 2 % cream Apply 1 application topically daily. Patient not taking: Reported on 01/12/2018 12/30/16   Coralyn Mark, NP  Multiple Vitamin (MULTIVITAMIN WITH MINERALS) TABS tablet Take 1 tablet by mouth daily.    [provider]  predniSONE (DELTASONE) 10 MG tablet Take 1 tablet (10 mg total) by mouth daily. 6,5,4,3,2,1 six day taper Patient not taking: Reported on 01/12/2018 10/04/17   Evon Slack, PA-C  Family History Family History  Problem Relation Age of Onset  . Hypertension Mother   . Hypertension Paternal Aunt   . Cancer Maternal Grandfather   . Stroke Maternal Grandfather   . Hypertension Father   . Asthma Son   . Kidney disease Paternal Grandmother   . Kidney disease Paternal Grandfather     Social History Social History   Tobacco Use  . Smoking status: Current Some Day Smoker    Packs/day: 0.25    Years: 3.00    Pack years: 0.75    Types: Cigarettes    Last attempt to quit: 06/08/2013    Years since quitting: 4.8  . Smokeless tobacco: Never Used  Substance Use Topics  . Alcohol use: Yes    Comment: occasional when not pregnant   . Drug use: No     Allergies   Other   Review of Systems Review of Systems   Physical Exam Triage Vital Signs ED Triage Vitals  Enc Vitals Group     BP 04/05/18 1035 125/70      Pulse Rate 04/05/18 1035 85     Resp 04/05/18 1035 20     Temp 04/05/18 1035 98.2 F (36.8 C)     Temp Source 04/05/18 1035 Oral     SpO2 04/05/18 1035 98 %     Weight --      Height --      Head Circumference --      Peak Flow --      Pain Score 04/05/18 1033 8     Pain Loc --      Pain Edu? --      Excl. in GC? --    No data found.  Updated Vital Signs BP 125/70 (BP Location: Left Arm)   Pulse 85   Temp 98.2 F (36.8 C) (Oral)   Resp 20   LMP 03/22/2018   SpO2 98%   Visual Acuity Right Eye Distance:   Left Eye Distance:   Bilateral Distance:    Right Eye Near:   Left Eye Near:    Bilateral Near:     Physical Exam  Constitutional: She is oriented to person, place, and time. She appears well-developed and well-nourished.  Exam limited due to body habitus.   HENT:  Head: Normocephalic and atraumatic.  Right Ear: External ear normal.  Left Ear: External ear normal.  Nose: Nose normal.  Oropharynx erythematous.  No exudates  Eyes: Pupils are equal, round, and reactive to light. Conjunctivae are normal.  Neck: Normal range of motion.  Cardiovascular: Normal rate and regular rhythm.  Pulmonary/Chest: Effort normal.  Lung sounds decreased in all fields  Abdominal: Soft.  Neurological: She is alert and oriented to person, place, and time.  Skin: Skin is warm and dry. Capillary refill takes less than 2 seconds.  Psychiatric: She has a normal mood and affect.  Nursing note and vitals reviewed.    UC Treatments / Results  Labs (all labs ordered are listed, but only abnormal results are displayed) Labs Reviewed - No data to display  EKG None  Radiology No results found.  Procedures Procedures (including critical care time)  Medications Ordered in UC Medications - No data to display  Initial Impression / Assessment and Plan / UC Course  I have reviewed the triage vital signs and the nursing notes.  Pertinent labs & imaging results that were available  during my care of the patient were reviewed by me and considered in my medical  decision making (see chart for details).     Based on exam most likely viral upper respiratory infection.  When attempting to discharge patient patient requested chest x-ray.  Chest x-ray not warranted but test done based on patient request.    Chest x-ray normal.  Prescribed Tessalon for cough and told to continue Mucinex. Final Clinical Impressions(s) / UC Diagnoses   Final diagnoses:  Acute upper respiratory infection     Discharge Instructions     It was nice meeting you!!  I believe this is an upper respiratory virus that will resolve on its own.  No need for antibiotics at this time.  The cough could last up to 4 weeks.  Please continue to take the mucinex and loratadine as you have been.  I will prescribe some cough medication to help.  If you don't get better in the next week let us know.     ED Prescriptions    Medication Sig Dispense Auth. Provider   benzonatate (TESSALON) 100 MG capsule Take 1 capsule (100 mg total) by mouth every 8 (eight) hours. 21 capsule Dahlia ByesBast, Tyrae Alcoser A, NP     Controlled Substance Prescriptions [place] Controlled Substance Registry consulted? Not Applicable   Janace ArisBast, Edon Hoadley A, NP 04/05/18 1533    Dahlia ByesBast, Maleek Craver A, NP 04/05/18 1533

## 2018-04-05 NOTE — Discharge Instructions (Signed)
It was nice meeting you!!  I believe this is an upper respiratory virus that will resolve on its own.  No need for antibiotics at this time.  The cough could last up to 4 weeks.  Please continue to take the mucinex and loratadine as you have been.  I will prescribe some cough medication to help.  If you don't get better in the next week let us know.

## 2018-04-05 NOTE — ED Triage Notes (Signed)
Pt presents with upper respiratory symptoms, nausea, vomiting, diarrhea, and chills.

## 2018-10-03 ENCOUNTER — Encounter (HOSPITAL_COMMUNITY): Payer: Self-pay | Admitting: *Deleted

## 2018-10-03 ENCOUNTER — Inpatient Hospital Stay (HOSPITAL_COMMUNITY)
Admission: AD | Admit: 2018-10-03 | Discharge: 2018-10-03 | Disposition: A | Discharge status: Home / Self Care | Payer: Medicaid Other | Attending: Family Medicine | Admitting: Family Medicine

## 2018-10-03 DIAGNOSIS — N3001 Acute cystitis with hematuria: Secondary | ICD-10-CM

## 2018-10-03 DIAGNOSIS — M549 Dorsalgia, unspecified: Secondary | ICD-10-CM | POA: Insufficient documentation

## 2018-10-03 DIAGNOSIS — M545 Low back pain, unspecified: Secondary | ICD-10-CM

## 2018-10-03 DIAGNOSIS — Z3202 Encounter for pregnancy test, result negative: Secondary | ICD-10-CM

## 2018-10-03 DIAGNOSIS — R319 Hematuria, unspecified: Secondary | ICD-10-CM | POA: Insufficient documentation

## 2018-10-03 DIAGNOSIS — N390 Urinary tract infection, site not specified: Secondary | ICD-10-CM | POA: Insufficient documentation

## 2018-10-03 DIAGNOSIS — Z87891 Personal history of nicotine dependence: Secondary | ICD-10-CM | POA: Insufficient documentation

## 2018-10-03 HISTORY — DX: Gonococcal infection, unspecified: A54.9

## 2018-10-03 HISTORY — DX: Chlamydial infection, unspecified: A74.9

## 2018-10-03 LAB — CBC WITH DIFFERENTIAL/PLATELET
Basophils Absolute: 0 10*3/uL (ref 0.0–0.1)
Basophils Relative: 0 %
Eosinophils Absolute: 0.4 10*3/uL (ref 0.0–0.5)
Eosinophils Relative: 5 %
HEMATOCRIT: 38.9 % (ref 36.0–46.0)
Hemoglobin: 12.2 g/dL (ref 12.0–15.0)
LYMPHS PCT: 45 %
Lymphs Abs: 3.6 10*3/uL (ref 0.7–4.0)
MCH: 26.5 pg (ref 26.0–34.0)
MCHC: 31.4 g/dL (ref 30.0–36.0)
MCV: 84.6 fL (ref 80.0–100.0)
MONO ABS: 0.3 10*3/uL (ref 0.1–1.0)
Monocytes Relative: 4 %
NEUTROS ABS: 3.7 10*3/uL (ref 1.7–7.7)
Neutrophils Relative %: 46 %
Platelets: 228 10*3/uL (ref 150–400)
RBC: 4.6 MIL/uL (ref 3.87–5.11)
RDW: 15.2 % (ref 11.5–15.5)
WBC: 8 10*3/uL (ref 4.0–10.5)
nRBC: 0 % (ref 0.0–0.2)

## 2018-10-03 LAB — URINALYSIS, ROUTINE W REFLEX MICROSCOPIC
BILIRUBIN URINE: NEGATIVE
GLUCOSE, UA: NEGATIVE mg/dL
Ketones, ur: NEGATIVE mg/dL
LEUKOCYTES UA: NEGATIVE
Nitrite: NEGATIVE
PH: 5 (ref 5.0–8.0)
Protein, ur: 100 mg/dL — AB
SPECIFIC GRAVITY, URINE: 1.02 (ref 1.005–1.030)

## 2018-10-03 LAB — POCT PREGNANCY, URINE: Preg Test, Ur: NEGATIVE

## 2018-10-03 MED ORDER — SULFAMETHOXAZOLE-TRIMETHOPRIM 800-160 MG PO TABS: 1.0000 | ORAL_TABLET | Freq: Two times a day (BID) | ORAL | 0 refills | Status: AC

## 2018-10-03 MED ORDER — OXYCODONE-ACETAMINOPHEN 5-325 MG PO TABS
2.0000 | ORAL_TABLET | Freq: Once | ORAL | Status: AC
  Administered 2018-10-03: 2 via ORAL
  Filled 2018-10-03: qty 2

## 2018-10-03 NOTE — Discharge Instructions (Signed)
Urinary Tract Infection, Adult A urinary tract infection (UTI) is an infection of any part of the urinary tract. The urinary tract includes:  The kidneys.  The ureters.  The bladder.  The urethra. These organs make, store, and get rid of pee (urine) in the body. What are the causes? This is caused by germs (bacteria) in your genital area. These germs grow and cause swelling (inflammation) of your urinary tract. What increases the risk? You are more likely to develop this condition if:  You have a small, thin tube (catheter) to drain pee.  You cannot control when you pee or poop (incontinence).  You are female, and: ? You use these methods to prevent pregnancy: ? A medicine that kills sperm (spermicide). ? A device that blocks sperm (diaphragm). ? You have low levels of a female hormone (estrogen). ? You are pregnant.  You have genes that add to your risk.  You are sexually active.  You take antibiotic medicines.  You have trouble peeing because of: ? A prostate that is bigger than normal, if you are female. ? A blockage in the part of your body that drains pee from the bladder (urethra). ? A kidney stone. ? A nerve condition that affects your bladder (neurogenic bladder). ? Not getting enough to drink. ? Not peeing often enough.  You have other conditions, such as: ? Diabetes. ? A weak disease-fighting system (immune system). ? Sickle cell disease. ? Gout. ? Injury of the spine. What are the signs or symptoms? Symptoms of this condition include:  Needing to pee right away (urgently).  Peeing often.  Peeing small amounts often.  Pain or burning when peeing.  Blood in the pee.  Pee that smells bad or not like normal.  Trouble peeing.  Pee that is cloudy.  Fluid coming from the vagina, if you are female.  Pain in the belly or lower back. Other symptoms include:  Throwing up (vomiting).  No urge to eat.  Feeling mixed up (confused).  Being tired  and grouchy (irritable).  A fever.  Watery poop (diarrhea). How is this treated? This condition may be treated with:  Antibiotic medicine.  Other medicines.  Drinking enough water. Follow these instructions at home:  Medicines  Take over-the-counter and prescription medicines only as told by your doctor.  If you were prescribed an antibiotic medicine, take it as told by your doctor. Do not stop taking it even if you start to feel better. General instructions  Make sure you: ? Pee until your bladder is empty. ? Do not hold pee for a long time. ? Empty your bladder after sex. ? Wipe from front to back after pooping if you are a female. Use each tissue one time when you wipe.  Drink enough fluid to keep your pee pale yellow.  Keep all follow-up visits as told by your doctor. This is important. Contact a doctor if:  You do not get better after 1-2 days.  Your symptoms go away and then come back. Get help right away if:  You have very bad back pain.  You have very bad pain in your lower belly.  You have a fever.  You are sick to your stomach (nauseous).  You are throwing up. Summary  A urinary tract infection (UTI) is an infection of any part of the urinary tract.  This condition is caused by germs in your genital area.  There are many risk factors for a UTI. These include having a small, thin   tube to drain pee and not being able to control when you pee or poop.  Treatment includes antibiotic medicines for germs.  Drink enough fluid to keep your pee pale yellow. This information is not intended to replace advice given to you by your health care provider. Make sure you discuss any questions you have with your health care provider. Document Released: 03/01/2008 Document Revised: 03/23/2018 Document Reviewed: 03/23/2018 Elsevier Interactive Patient Education  2019 [employer] Inc[employer]  

## 2018-10-03 NOTE — MAU Provider Note (Addendum)
History     CSN: [id number]  Arrival date and time: 10/03/18 1059   First Provider Initiated Contact with Patient 10/03/18 1129      Chief Complaint  Patient presents with  . Back Pain  . Abdominal Pain   Sandra Wong is a 33 yo female presenting today with bilateral low back and pelvic pain that wraps around from her PSIS to ASIS. This pain started intermittently about 2 weeks ago and worsened last night to a "consistant, firery, and severe" pain. Patient states that spreading her legs, stretching, and direct pressure make to pain worse. She claims to have had little relief with ibuprofen and heating pads. Patient admits to a UTI 1 month ago for which she self treated with a left over prescription of Amoxicillin. She claims to have chronic mid thoracic pain but states that this pain does not feel the same. She denies a history of nephrolithiasis.  Patient's LMP was on December 30th, lasting 3 days and followed by consistent spotting since. She claims that this is abnormal, stating that her periods are usually regular and last 7 days. She was last sexually active 1 month ago without the use of protection. She has taken a home pregnancy test which was negative.    Pertinent Gynecological History: Menses: Patient's menses are normally regular, occuring every month and lasting 7 days without spotting. Bleeding: intermenstrual bleeding Contraception: none DES exposure: denies Blood transfusions: none Sexually transmitted diseases: history of GC and chlamydia infection. At  Previous GYN Procedures: none  Last mammogram: N/A     Past Medical History:  Diagnosis Date  . Abortion   . Anemia 2009   s/p delivery  . Anxiety   . Chlamydia   . Chronic back pain   . Depression   . Gonorrhea   . Headache(784.0)    no treatment occasional headache  . Obesity   . Sleep apnea    per patient, no study    Past Surgical History:  Procedure Laterality Date  . DILATION AND CURETTAGE  OF UTERUS    . INDUCED ABORTION    . LAPAROSCOPIC TUBAL LIGATION Bilateral 01/15/2016   Procedure: LAPAROSCOPIC BILATERAL TUBAL LIGATION;  Surgeon: Tereso Newcomer, MD;  Location: [facility];  Service: Gynecology;  Laterality: Bilateral;  . TUBAL LIGATION      Family History  Problem Relation Age of Onset  . Hypertension Mother   . Hypertension Paternal Aunt   . Cancer Maternal Grandfather   . Stroke Maternal Grandfather   . Hypertension Father   . Asthma Son   . Kidney disease Paternal Grandmother   . Kidney disease Paternal Grandfather     Social History   Tobacco Use  . Smoking status: Former Smoker    Packs/day: 0.25    Years: 3.00    Pack years: 0.75    Types: Cigarettes    Last attempt to quit: 06/08/2013    Years since quitting: 5.3  . Smokeless tobacco: Never Used  Substance Use Topics  . Alcohol use: Not Currently    Comment: occasional when not pregnant   . Drug use: No    Allergies:  Allergies  Allergen Reactions  . Other     Has an allergy to some type of eye drop but does not remember the name (in childhood)    Medications Prior to Admission  Medication Sig Dispense Refill Last Dose  . amoxicillin (AMOXIL) 875 MG tablet Take 1 tablet (875 mg total) by mouth 2 (two) times  daily. X 10 days (Patient not taking: Reported on 01/12/2018) 20 tablet 0 Not Taking at Unknown time  . benzonatate (TESSALON) 100 MG capsule Take 1 capsule (100 mg total) by mouth every 8 (eight) hours. 21 capsule 0   . cephALEXin (KEFLEX) 500 MG capsule Take 1 capsule (500 mg total) by mouth 4 (four) times daily. (Patient not taking: Reported on 01/12/2018) 28 capsule 0 Not Taking at Unknown time  . ibuprofen (ADVIL,MOTRIN) 800 MG tablet Take 1 tablet (800 mg total) by mouth every 8 (eight) hours as needed for mild pain. (Patient not taking: Reported on 01/12/2018) 30 tablet 0 Not Taking at Unknown time  . ketoconazole (NIZORAL) 2 % cream Apply 1 application topically daily. (Patient not  taking: Reported on 01/12/2018) 15 g 0 Not Taking at Unknown time  . Multiple Vitamin (MULTIVITAMIN WITH MINERALS) TABS tablet Take 1 tablet by mouth daily.   01/12/2018 at Unknown time  . predniSONE (DELTASONE) 10 MG tablet Take 1 tablet (10 mg total) by mouth daily. 6,5,4,3,2,1 six day taper (Patient not taking: Reported on 01/12/2018) 21 tablet 0 Not Taking at Unknown time    Review of Systems  Constitutional: Positive for fatigue. Negative for chills and fever.  Gastrointestinal: Negative for abdominal pain, constipation, diarrhea, nausea and vomiting.  Genitourinary: Positive for pelvic pain, urgency and vaginal bleeding. Negative for dysuria, flank pain and frequency.  Musculoskeletal: Positive for back pain.  Psychiatric/Behavioral: The patient is nervous/anxious.    Physical Exam   Blood pressure 125/76, pulse 80, temperature 97.9 F (36.6 C), temperature source Oral, resp. rate 18, height 5\' 9"  (1.753 m), weight (!) 144.8 kg, last menstrual period 09/25/2018.  Physical Exam  Constitutional: She is oriented to person, place, and time. She appears well-developed and well-nourished.  Appears uncomfortable, often changes positions and becomes tearful throughout interview  HENT:  Head: Normocephalic and atraumatic.  GI: She exhibits no distension and no mass. There is no abdominal tenderness. There is no rebound and no guarding.  Musculoskeletal:        General: Tenderness present.  Neurological: She is alert and oriented to person, place, and time.    MAU Course  Procedures   33 yo female presenting with colicky, severe lower back/pelvic pain with a possible history of insufficiently treated UTI. Urine pregnancy test was negative. UA results showed elevated hbg, protein, WBC, and mucus, possibly suggesting a UTI.  Results for orders placed or performed during the hospital encounter of 10/03/18 (from the past 24 hour(s))  Urinalysis, Routine w reflex microscopic     Status:  Abnormal   Collection Time: 10/03/18 11:03 AM  Result Value Ref Range   Color, Urine YELLOW YELLOW   APPearance HAZY (A) CLEAR   Specific Gravity, Urine 1.020 1.005 - 1.030   pH 5.0 5.0 - 8.0   Glucose, UA NEGATIVE NEGATIVE mg/dL   Hgb urine dipstick SMALL (A) NEGATIVE   Bilirubin Urine NEGATIVE NEGATIVE   Ketones, ur NEGATIVE NEGATIVE mg/dL   Protein, ur 664100 (A) NEGATIVE mg/dL   Nitrite NEGATIVE NEGATIVE   Leukocytes, UA NEGATIVE NEGATIVE   RBC / HPF 0-5 0 - 5 RBC/hpf   WBC, UA 6-10 0 - 5 WBC/hpf   Bacteria, UA RARE (A) NONE SEEN   Squamous Epithelial / LPF 6-10 0 - 5   Mucus PRESENT   Pregnancy, urine POC     Status: None   Collection Time: 10/03/18 11:27 AM  Result Value Ref Range   Preg Test, Ur NEGATIVE  NEGATIVE  CBC with Differential/Platelet     Status: None   Collection Time: 10/03/18 12:11 PM  Result Value Ref Range   WBC 8.0 4.0 - 10.5 K/uL   RBC 4.60 3.87 - 5.11 MIL/uL   Hemoglobin 12.2 12.0 - 15.0 g/dL   HCT 16.138.9 09.636.0 - 04.546.0 %   MCV 84.6 80.0 - 100.0 fL   MCH 26.5 26.0 - 34.0 pg   MCHC 31.4 30.0 - 36.0 g/dL   RDW 40.915.2 81.111.5 - 91.415.5 %   Platelets 228 150 - 400 K/uL   nRBC 0.0 0.0 - 0.2 %   Neutrophils Relative % 46 %   Neutro Abs 3.7 1.7 - 7.7 K/uL   Lymphocytes Relative 45 %   Lymphs Abs 3.6 0.7 - 4.0 K/uL   Monocytes Relative 4 %   Monocytes Absolute 0.3 0.1 - 1.0 K/uL   Eosinophils Relative 5 %   Eosinophils Absolute 0.4 0.0 - 0.5 K/uL   Basophils Relative 0 %   Basophils Absolute 0.0 0.0 - 0.1 K/uL    Assessment and Plan  Patient started on Percocet 5-325 mg for severe pain   Melanie Mermiges 10/03/2018, 11:57 AM   Student Note: All care coordinated by me, physical assessment reformed by me prior to treatment plan development.  Provider MDM: --Patient is crying in MAU with waxing and waning episodes of pain --No CVA tenderness, no concerning findings on CBC, no history of kidney stones --Bilateral back pain resolved with Percocet --Hematuria  noted, history of untreated UTI --Treat for UTI based on previous diagnosis and incomplete treatment plus hematuria and back pain Patient Vitals for the past 24 hrs:  BP Temp Temp src Pulse Resp Height Weight  10/03/18 1117 125/76 97.9 F (36.6 C) Oral 80 18 - -  10/03/18 1109 - - - - - 5\' 9"  (1.753 m) (!) 144.8 kg    Results for orders placed or performed during the hospital encounter of 10/03/18 (from the past 24 hour(s))  Urinalysis, Routine w reflex microscopic     Status: Abnormal   Collection Time: 10/03/18 11:03 AM  Result Value Ref Range   Color, Urine YELLOW YELLOW   APPearance HAZY (A) CLEAR   Specific Gravity, Urine 1.020 1.005 - 1.030   pH 5.0 5.0 - 8.0   Glucose, UA NEGATIVE NEGATIVE mg/dL   Hgb urine dipstick SMALL (A) NEGATIVE   Bilirubin Urine NEGATIVE NEGATIVE   Ketones, ur NEGATIVE NEGATIVE mg/dL   Protein, ur 782100 (A) NEGATIVE mg/dL   Nitrite NEGATIVE NEGATIVE   Leukocytes, UA NEGATIVE NEGATIVE   RBC / HPF 0-5 0 - 5 RBC/hpf   WBC, UA 6-10 0 - 5 WBC/hpf   Bacteria, UA RARE (A) NONE SEEN   Squamous Epithelial / LPF 6-10 0 - 5   Mucus PRESENT   Pregnancy, urine POC     Status: None   Collection Time: 10/03/18 11:27 AM  Result Value Ref Range   Preg Test, Ur NEGATIVE NEGATIVE  CBC with Differential/Platelet     Status: None   Collection Time: 10/03/18 12:11 PM  Result Value Ref Range   WBC 8.0 4.0 - 10.5 K/uL   RBC 4.60 3.87 - 5.11 MIL/uL   Hemoglobin 12.2 12.0 - 15.0 g/dL   HCT 95.638.9 21.336.0 - 08.646.0 %   MCV 84.6 80.0 - 100.0 fL   MCH 26.5 26.0 - 34.0 pg   MCHC 31.4 30.0 - 36.0 g/dL   RDW 57.815.2 46.911.5 - 62.915.5 %   Platelets  228 150 - 400 K/uL   nRBC 0.0 0.0 - 0.2 %   Neutrophils Relative % 46 %   Neutro Abs 3.7 1.7 - 7.7 K/uL   Lymphocytes Relative 45 %   Lymphs Abs 3.6 0.7 - 4.0 K/uL   Monocytes Relative 4 %   Monocytes Absolute 0.3 0.1 - 1.0 K/uL   Eosinophils Relative 5 %   Eosinophils Absolute 0.4 0.0 - 0.5 K/uL   Basophils Relative 0 %   Basophils  Absolute 0.0 0.0 - 0.1 K/uL    Meds ordered this encounter  Medications  . oxyCODONE-acetaminophen (PERCOCET/ROXICET) 5-325 MG per tablet 2 tablet  . sulfamethoxazole-trimethoprim (BACTRIM DS,SEPTRA DS) 800-160 MG tablet    Sig: Take 1 tablet by mouth 2 (two) times daily for 7 days.    Dispense:  14 tablet    Refill:  0    Order Specific Question:   Supervising Provider    Answer:   Samara Snide   A/P: 33 y.o. [phone] non pregnant patient Hematuria with back pain, treat for UTI Discharge home in stable condition  Return to MAU or closest ED for worsening symptoms  Clayton Bibles, CNM 10/03/18  1:46 PM

## 2018-10-03 NOTE — MAU Note (Signed)
Pt C/O lower back pain & pelvic pain for several weeks.  Was intermittent, now constant cramping.  Had period last week, lasted 3 days but has continued to have spotting.  Has had BTL.

## 2018-10-07 ENCOUNTER — Other Ambulatory Visit: Payer: Self-pay

## 2018-10-07 ENCOUNTER — Encounter (HOSPITAL_COMMUNITY): Payer: Self-pay | Admitting: *Deleted

## 2018-10-07 ENCOUNTER — Inpatient Hospital Stay (HOSPITAL_COMMUNITY)
Admission: AD | Admit: 2018-10-07 | Discharge: 2018-10-07 | Disposition: A | Discharge status: Home / Self Care | Payer: Medicaid Other | Source: Ambulatory Visit | Attending: Family Medicine | Admitting: Family Medicine

## 2018-10-07 DIAGNOSIS — M545 Low back pain, unspecified: Secondary | ICD-10-CM

## 2018-10-07 DIAGNOSIS — Z87891 Personal history of nicotine dependence: Secondary | ICD-10-CM | POA: Insufficient documentation

## 2018-10-07 LAB — URINALYSIS, ROUTINE W REFLEX MICROSCOPIC
BACTERIA UA: NONE SEEN
BILIRUBIN URINE: NEGATIVE
Glucose, UA: NEGATIVE mg/dL
HGB URINE DIPSTICK: NEGATIVE
KETONES UR: 5 mg/dL — AB
Leukocytes, UA: NEGATIVE
NITRITE: NEGATIVE
PH: 6 (ref 5.0–8.0)
Protein, ur: 100 mg/dL — AB
Specific Gravity, Urine: 1.02 (ref 1.005–1.030)

## 2018-10-07 MED ORDER — TRAMADOL HCL 50 MG PO TABS: 50.0000 mg | ORAL_TABLET | Freq: Four times a day (QID) | ORAL | 0 refills | Status: AC | PRN

## 2018-10-07 MED ORDER — CYCLOBENZAPRINE HCL 10 MG PO TABS
10.0000 mg | ORAL_TABLET | Freq: Once | ORAL | Status: AC
  Administered 2018-10-07: 10 mg via ORAL
  Filled 2018-10-07: qty 1

## 2018-10-07 MED ORDER — ACETAMINOPHEN 500 MG PO TABS
1000.0000 mg | ORAL_TABLET | Freq: Once | ORAL | Status: AC
  Administered 2018-10-07: 1000 mg via ORAL
  Filled 2018-10-07: qty 2

## 2018-10-07 NOTE — MAU Note (Signed)
Came in Tues, was hurting really bad in abd and back.  Was put on RX for ? UTI.  Pain has actually gotten worse, feels like skin in burning.  Can see blood in her urine, tinted.  Not peeing as frequently, no pain when she goes.  Pain in kidneys, not really able to sleep, lay -sit or stand because of pain.  Also now having pelvic pain.

## 2018-10-07 NOTE — MAU Note (Signed)
+  CVA tenderness, rt side worse

## 2018-10-07 NOTE — MAU Note (Signed)
Urine in lab 

## 2018-10-07 NOTE — MAU Provider Note (Signed)
Chief Complaint: Back Pain and Hematuria   First Provider Initiated Contact with Patient 10/07/18 1350     SUBJECTIVE HPI: Sandra Wong is a 33 y.o. non pregnant female who presents to [facility] reporting back pain, flank pain, & hematuria. Was seen in MAU on Tuesday for same symptoms. Diagnosed with UTI & told to return if symptoms didn't improve. Pt states symptoms have continued. Reports low back pain and right flank pain. Denies dysuria, frequency, n/v, or fever/chills. Noted hematuria when she was seen the other day but hasn't seen it since then. She has been taking her antibiotics as prescribed but hasn't been taking anything else for the pain. Has first appointment with a new PCP on Tuesday.   Location: back and flank Quality: burning Severity: 10/10 on pain scale Duration: 4 days Timing: constant Modifying factors: none Associated signs and symptoms: none  Past Medical History:  Diagnosis Date  . Abortion   . Anemia 2009   s/p delivery  . Anxiety   . Chlamydia   . Chronic back pain   . Depression   . Gonorrhea   . Headache(784.0)    no treatment occasional headache  . Obesity   . Sleep apnea    per patient, no study   OB History  Gravida Para Term Preterm AB Living  4 3 2 1 1 3   SAB TAB Ectopic Multiple Live Births  0 0 0 0 3    # Outcome Date GA Lbr Len/2nd Weight Sex Delivery Anes PTL Lv  4 Term 09/10/15 2930w1d 03:38 / 00:07 3080 g M Vag-Spont None  LIV  3 AB 06/2014 163w0d    TAB     2 Preterm 01/03/14 683w5d 01:23 1091 g M Vag-Spont None  LIV  1 Term 04/08/08 7551w0d  3345 g M Vag-Spont EPI  LIV     Birth Comments: induced    Obstetric Comments  2015 - attempted magnesium, SROM, rapid delivery, postpartum hemorrhage - cytotec   Past Surgical History:  Procedure Laterality Date  . DILATION AND CURETTAGE OF UTERUS    . INDUCED ABORTION    . LAPAROSCOPIC TUBAL LIGATION Bilateral 01/15/2016   Procedure: LAPAROSCOPIC BILATERAL TUBAL LIGATION;   Surgeon: Tereso NewcomerUgonna A Anyanwu, MD;  Location: [facility];  Service: Gynecology;  Laterality: Bilateral;   Social History   Socioeconomic History  . Marital status: Single    Spouse name: Not on file  . Number of children: Not on file  . Years of education: Not on file  . Highest education level: Not on file  Occupational History  . Not on file  Social Needs  . Financial resource strain: Not on file  . Food insecurity:    Worry: Not on file    Inability: Not on file  . Transportation needs:    Medical: Not on file    Non-medical: Not on file  Tobacco Use  . Smoking status: Former Smoker    Packs/day: 0.25    Years: 3.00    Pack years: 0.75    Types: Cigarettes    Last attempt to quit: 06/08/2013    Years since quitting: 5.3  . Smokeless tobacco: Never Used  Substance and Sexual Activity  . Alcohol use: Not Currently    Comment: occasional when not pregnant   . Drug use: No  . Sexual activity: Yes    Birth control/protection: None  Lifestyle  . Physical activity:    Days per week: Not on file    Minutes per  session: Not on file  . Stress: Not on file  Relationships  . Social connections:    Talks on phone: Not on file    Gets together: Not on file    Attends religious service: Not on file    Active member of club or organization: Not on file    Attends meetings of clubs or organizations: Not on file    Relationship status: Not on file  . Intimate partner violence:    Fear of current or ex partner: Not on file    Emotionally abused: Not on file    Physically abused: Not on file    Forced sexual activity: Not on file  Other Topics Concern  . Not on file  Social History Narrative  . Not on file   Family History  Problem Relation Age of Onset  . Hypertension Mother   . Hypertension Paternal Aunt   . Cancer Maternal Grandfather   . Stroke Maternal Grandfather   . Hypertension Father   . Asthma Son   . Kidney disease Paternal Grandmother   . Kidney disease Paternal  Grandfather    No current facility-administered medications on file prior to encounter.    Current Outpatient Medications on File Prior to Encounter  Medication Sig Dispense Refill  . Multiple Vitamin (MULTIVITAMIN WITH MINERALS) TABS tablet Take 1 tablet by mouth daily.    Marland Kitchen. sulfamethoxazole-trimethoprim (BACTRIM DS,SEPTRA DS) 800-160 MG tablet Take 1 tablet by mouth 2 (two) times daily for 7 days. 14 tablet 0   Allergies  Allergen Reactions  . Other     Has an allergy to some type of eye drop but does not remember the name (in childhood)    I have reviewed patient's Past Medical Hx, Surgical Hx, Family Hx, Social Hx, medications and allergies.   Review of Systems  Constitutional: Negative.   Gastrointestinal: Negative.   Genitourinary: Positive for flank pain and hematuria. Negative for dysuria, frequency, vaginal bleeding and vaginal discharge.  Musculoskeletal: Positive for back pain.    OBJECTIVE Patient Vitals for the past 24 hrs:  BP Temp Temp src Pulse Resp SpO2 Weight  10/07/18 1529 126/76 - - 80 - - -  10/07/18 1307 127/75 98.1 F (36.7 C) Oral 93 18 100 % (!) 146.7 kg   Constitutional: Well-developed, well-nourished female in no acute distress.  Cardiovascular: normal rate & rhythm, no murmur Respiratory: normal rate and effort. Lung sounds clear throughout GI:  + CVAT. Abd soft, non-tender, Pos BS x 4. No guarding or rebound tenderness MS: Extremities nontender, no edema, normal ROM Neurologic: Alert and oriented x 4.     LAB RESULTS Results for orders placed or performed during the hospital encounter of 10/07/18 (from the past 24 hour(s))  Urinalysis, Routine w reflex microscopic     Status: Abnormal   Collection Time: 10/07/18  1:20 PM  Result Value Ref Range   Color, Urine YELLOW YELLOW   APPearance CLEAR CLEAR   Specific Gravity, Urine 1.020 1.005 - 1.030   pH 6.0 5.0 - 8.0   Glucose, UA NEGATIVE NEGATIVE mg/dL   Hgb urine dipstick NEGATIVE NEGATIVE    Bilirubin Urine NEGATIVE NEGATIVE   Ketones, ur 5 (A) NEGATIVE mg/dL   Protein, ur 161100 (A) NEGATIVE mg/dL   Nitrite NEGATIVE NEGATIVE   Leukocytes, UA NEGATIVE NEGATIVE   RBC / HPF 0-5 0 - 5 RBC/hpf   WBC, UA 0-5 0 - 5 WBC/hpf   Bacteria, UA NONE SEEN NONE SEEN   Squamous  Epithelial / LPF 0-5 0 - 5   Mucus PRESENT     IMAGING No results found.  MAU COURSE Orders Placed This Encounter  Procedures  . Urinalysis, Routine w reflex microscopic  . Discharge patient   Meds ordered this encounter  Medications  . acetaminophen (TYLENOL) tablet 1,000 mg  . cyclobenzaprine (FLEXERIL) tablet 10 mg  . traMADol (ULTRAM) 50 MG tablet    Sig: Take 1 tablet (50 mg total) by mouth every 6 (six) hours as needed for up to 3 days for severe pain.    Dispense:  12 tablet    Refill:  0    Order Specific Question:   Supervising Provider    Answer:   Samara Snide    MDM VSS Flexeril & tylenol given with some relief Will rx with ultram Increase water intake. F/u with PCP on Tuesday as scheduled. If symptoms worsen or fever develops, go to [facility] or [facility] ED.   ASSESSMENT 1. Acute bilateral low back pain without sciatica     PLAN Discharge home in stable condition.  Follow-up Information    [facility]-EMERGENCY DEPT Follow up.   Specialty:  Emergency Medicine Why:  If symptoms worsen or fever develops. Otherwise, follow up with your doctor on Tuesday as scheduled.  Contact information: [address] [phone] [facility] [address] [place] [address] [phone]         Allergies as of 10/07/2018      Reactions   Other    Has an allergy to some type of eye drop but does not remember the name (in childhood)      Medication List    TAKE these medications   multivitamin with minerals Tabs tablet Take 1 tablet by mouth daily.   sulfamethoxazole-trimethoprim 800-160 MG tablet Commonly known as:  BACTRIM DS,SEPTRA DS Take 1 tablet by mouth 2 (two)  times daily for 7 days.   traMADol 50 MG tablet Commonly known as:  ULTRAM Take 1 tablet (50 mg total) by mouth every 6 (six) hours as needed for up to 3 days for severe pain.        Judeth Horn, NP 10/07/2018  7:52 PM

## 2018-10-07 NOTE — Discharge Instructions (Signed)
Acute Back Pain, Adult Acute back pain is sudden and usually short-lived. It is often caused by an injury to the muscles and tissues in the back. The injury may result from:  A muscle or ligament getting overstretched or torn (strained). Ligaments are tissues that connect bones to each other. Lifting something improperly can cause a back strain.  Wear and tear (degeneration) of the spinal disks. Spinal disks are circular tissue that provides cushioning between the bones of the spine (vertebrae).  Twisting motions, such as while playing sports or doing yard work.  A hit to the back.  Arthritis. You may have a physical exam, lab tests, and imaging tests to find the cause of your pain. Acute back pain usually goes away with rest and home care. Follow these instructions at home: Managing pain, stiffness, and swelling  Take over-the-counter and prescription medicines only as told by your health care provider.  Your health care provider may recommend applying ice during the first 24-48 hours after your pain starts. To do this: ? Put ice in a plastic bag. ? Place a towel between your skin and the bag. ? Leave the ice on for 20 minutes, 2-3 times a day.  If directed, apply heat to the affected area as often as told by your health care provider. Use the heat source that your health care provider recommends, such as a moist heat pack or a heating pad. ? Place a towel between your skin and the heat source. ? Leave the heat on for 20-30 minutes. ? Remove the heat if your skin turns bright red. This is especially important if you are unable to feel pain, heat, or cold. You have a greater risk of getting burned. Activity   Do not stay in bed. Staying in bed for more than 1-2 days can delay your recovery.  Sit up and stand up straight. Avoid leaning forward when you sit, or hunching over when you stand. ? If you work at a desk, sit close to it so you do not need to lean over. Keep your chin tucked  in. Keep your neck drawn back, and keep your elbows bent at a right angle. Your arms should look like the letter "L." ? Sit high and close to the steering wheel when you drive. Add lower back (lumbar) support to your car seat, if needed.  Take short walks on even surfaces as soon as you are able. Try to increase the length of time you walk each day.  Do not sit, drive, or stand in one place for more than 30 minutes at a time. Sitting or standing for long periods of time can put stress on your back.  Do not drive or use heavy machinery while taking prescription pain medicine.  Use proper lifting techniques. When you bend and lift, use positions that put less stress on your back: ? [place] your knees. ? Keep the load close to your body. ? Avoid twisting.  Exercise regularly as told by your health care provider. Exercising helps your back heal faster and helps prevent back injuries by keeping muscles strong and flexible.  Work with a physical therapist to make a safe exercise program, as recommended by your health care provider. Do any exercises as told by your physical therapist. Lifestyle  Maintain a healthy weight. Extra weight puts stress on your back and makes it difficult to have good posture.  Avoid activities or situations that make you feel anxious or stressed. Stress and anxiety increase muscle  tension and can make back pain worse. Learn ways to manage anxiety and stress, such as through exercise. General instructions  Sleep on a firm mattress in a comfortable position. Try lying on your side with your knees slightly bent. If you lie on your back, put a pillow under your knees.  Follow your treatment plan as told by your health care provider. This may include: ? Cognitive or behavioral therapy. ? Acupuncture or massage therapy. ? Meditation or yoga. Contact a health care provider if:  You have pain that is not relieved with rest or medicine.  You have increasing pain going down  into your legs or buttocks.  Your pain does not improve after 2 weeks.  You have pain at night.  You lose weight without trying.  You have a fever or chills. Get help right away if:  You develop new bowel or bladder control problems.  You have unusual weakness or numbness in your arms or legs.  You develop nausea or vomiting.  You develop abdominal pain.  You feel faint. Summary Kidney Stones  Kidney stones (urolithiasis) are rock-like masses that form inside of the kidneys. Kidneys are organs that make pee (urine). A kidney stone can cause very bad pain and can block the flow of pee. The stone usually leaves your body (passes) through your pee. You may need to have a doctor take out the stone. Follow these instructions at home: Eating and drinking  Drink enough fluid to keep your pee clear or pale yellow. This will help you pass the stone.  If told by your doctor, change the foods you eat (your diet). This may include: ? Limiting how much salt (sodium) you eat. ? Eating more fruits and vegetables. ? Limiting how much meat, poultry, fish, and eggs you eat.  Follow instructions from your doctor about eating or drinking restrictions. General instructions  Collect pee samples as told by your doctor. You may need to collect a pee sample: ? 24 hours after a stone comes out. ? 8-12 weeks after a stone comes out, and every 6-12 months after that.  Strain your pee every time you pee (urinate), for as long as told. Use the strainer that your doctor recommends.  Do not throw out the stone. Keep it so that it can be tested by your doctor.  Take over-the-counter and prescription medicines only as told by your doctor.  Keep all follow-up visits as told by your doctor. This is important. You may need follow-up tests. Preventing kidney stones To prevent another kidney stone:  Drink enough fluid to keep your pee clear or pale yellow. This is the best way to prevent kidney  stones.  Eat healthy foods.  Avoid certain foods as told by your doctor. You may be told to eat less protein.  Stay at a healthy weight. Contact a doctor if:  You have pain that gets worse or does not get better with medicine. Get help right away if:  You have a fever or chills.  You get very bad pain.  You get new pain in your belly (abdomen).  You pass out (faint).  You cannot pee. This information is not intended to replace advice given to you by your health care provider. Make sure you discuss any questions you have with your health care provider. Document Released: 03/01/2008 Document Revised: 06/01/2016 Document Reviewed: 06/01/2016 Elsevier Interactive Patient Education  2019 [employer].   Acute back pain is sudden and usually short-lived.  Use proper lifting  techniques. When you bend and lift, use positions that put less stress on your back.  Take over-the-counter and prescription medicines and apply heat or ice as directed by your health care provider. This information is not intended to replace advice given to you by your health care provider. Make sure you discuss any questions you have with your health care provider. Document Released: 09/13/2005 Document Revised: 04/20/2018 Document Reviewed: 04/27/2017 Elsevier Interactive Patient Education  2019 [employer].

## 2019-06-01 ENCOUNTER — Other Ambulatory Visit: Payer: Self-pay

## 2019-06-01 DIAGNOSIS — Z20822 Contact with and (suspected) exposure to covid-19: Secondary | ICD-10-CM

## 2019-06-03 LAB — NOVEL CORONAVIRUS, NAA: SARS-CoV-2, NAA: NOT DETECTED

## 2019-06-28 ENCOUNTER — Ambulatory Visit (HOSPITAL_COMMUNITY)
Admission: EM | Admit: 2019-06-28 | Discharge: 2019-06-28 | Disposition: A | Discharge status: Home / Self Care | Payer: Medicaid Other | Attending: Urgent Care | Admitting: Urgent Care

## 2019-06-28 ENCOUNTER — Encounter (HOSPITAL_COMMUNITY): Payer: Self-pay

## 2019-06-28 ENCOUNTER — Other Ambulatory Visit: Payer: Self-pay

## 2019-06-28 DIAGNOSIS — J0100 Acute maxillary sinusitis, unspecified: Secondary | ICD-10-CM

## 2019-06-28 DIAGNOSIS — R0981 Nasal congestion: Secondary | ICD-10-CM

## 2019-06-28 DIAGNOSIS — J3089 Other allergic rhinitis: Secondary | ICD-10-CM

## 2019-06-28 MED ORDER — PREDNISONE 20 MG PO TABS: ORAL_TABLET | ORAL | 0 refills | Status: DC

## 2019-06-28 MED ORDER — AMOXICILLIN 875 MG PO TABS: 875.0000 mg | ORAL_TABLET | Freq: Two times a day (BID) | ORAL | 0 refills | Status: DC

## 2019-06-28 NOTE — ED Provider Notes (Signed)
MRN: [id number] DOB: 1986-09-10  Subjective:   Sandra Wong is a 33 y.o. female presenting for 2-week history of persistent constant sinus pain, sinus congestion, postnasal drainage, bilateral ear itching and dry cough.  Patient had testing for COVID-19 in early September and was negative.  She generally does social distance.  Takes Claritin daily for allergies.  Denies smoking cigarettes.  No current facility-administered medications for this encounter.   Current Outpatient Medications:  Marland Kitchen  Multiple Vitamin (MULTIVITAMIN WITH MINERALS) TABS tablet, Take 1 tablet by mouth daily., Disp: , Rfl:    Allergies  Allergen Reactions  . Other     Has an allergy to some type of eye drop but does not remember the name (in childhood)    Past Medical History:  Diagnosis Date  . Abortion   . Anemia 2009   s/p delivery  . Anxiety   . Chlamydia   . Chronic back pain   . Depression   . Gonorrhea   . Headache(784.0)    no treatment occasional headache  . Obesity   . Sleep apnea    per patient, no study     Past Surgical History:  Procedure Laterality Date  . DILATION AND CURETTAGE OF UTERUS    . INDUCED ABORTION    . LAPAROSCOPIC TUBAL LIGATION Bilateral 01/15/2016   Procedure: LAPAROSCOPIC BILATERAL TUBAL LIGATION;  Surgeon: Osborne Oman, MD;  Location: [facility];  Service: Gynecology;  Laterality: Bilateral;    ROS  Objective:   Vitals: BP 137/83 (BP Location: Right Arm)   Pulse 72   Temp 97.7 F (36.5 C) (Oral)   Resp 20   Wt 300 lb (136.1 kg)   LMP 06/14/2019   SpO2 97%   BMI 44.30 kg/m   Physical Exam Constitutional:      General: She is not in acute distress.    Appearance: Normal appearance. She is well-developed. She is not ill-appearing, toxic-appearing or diaphoretic.  HENT:     Head: Normocephalic and atraumatic.     Right Ear: Ear canal normal. No drainage or tenderness. No middle ear effusion. Tympanic membrane is not erythematous.     Left Ear: Ear  canal normal. No drainage or tenderness.  No middle ear effusion. Tympanic membrane is not erythematous.     Ears:     Comments: TMs opacified bilaterally.    Nose: Congestion and rhinorrhea present.     Mouth/Throat:     Mouth: Mucous membranes are moist. No oral lesions.     Pharynx: No pharyngeal swelling, oropharyngeal exudate, posterior oropharyngeal erythema or uvula swelling.     Tonsils: No tonsillar exudate or tonsillar abscesses.     Comments: Significant postnasal drainage. Eyes:     General: No scleral icterus.    Extraocular Movements: Extraocular movements intact.     Right eye: Normal extraocular motion.     Left eye: Normal extraocular motion.     Conjunctiva/sclera: Conjunctivae normal.     Pupils: Pupils are equal, round, and reactive to light.  Neck:     Musculoskeletal: Normal range of motion and neck supple.  Cardiovascular:     Rate and Rhythm: Normal rate and regular rhythm.     Pulses: Normal pulses.     Heart sounds: Normal heart sounds. No murmur. No friction rub. No gallop.   Pulmonary:     Effort: Pulmonary effort is normal. No respiratory distress.     Breath sounds: Normal breath sounds. No stridor. No wheezing, rhonchi or rales.  Lymphadenopathy:     Cervical: No cervical adenopathy.  Skin:    General: Skin is warm and dry.     Findings: No rash.  Neurological:     General: No focal deficit present.     Mental Status: She is alert and oriented to person, place, and time.  Psychiatric:        Mood and Affect: Mood normal.        Behavior: Behavior normal.        Thought Content: Thought content normal.      Assessment and Plan :   1. Acute maxillary sinusitis, recurrence not specified   2. Sinus congestion   3. Allergic rhinitis due to other allergic trigger, unspecified seasonality     Will cover for sinusitis with amoxicillin secondary to allergic rhinitis.  We will have her start a short steroid course to help with this.  Patient is to  maintain Claritin. Counseled patient on potential for adverse effects with medications prescribed/recommended today, ER and return-to-clinic precautions discussed, patient verbalized understanding.    Wallis Bamberg, [place] 06/28/19 7824

## 2019-06-28 NOTE — ED Triage Notes (Signed)
Pt states she has a sinus drainage and pressures. X 2 weeks.

## 2019-07-27 ENCOUNTER — Other Ambulatory Visit: Payer: Self-pay

## 2019-07-27 DIAGNOSIS — Z20822 Contact with and (suspected) exposure to covid-19: Secondary | ICD-10-CM

## 2019-07-29 LAB — NOVEL CORONAVIRUS, NAA: SARS-CoV-2, NAA: NOT DETECTED

## 2019-09-10 ENCOUNTER — Other Ambulatory Visit (HOSPITAL_BASED_OUTPATIENT_CLINIC_OR_DEPARTMENT_OTHER): Payer: Self-pay

## 2019-09-10 DIAGNOSIS — R5383 Other fatigue: Secondary | ICD-10-CM

## 2019-09-10 DIAGNOSIS — G4730 Sleep apnea, unspecified: Secondary | ICD-10-CM

## 2019-09-10 DIAGNOSIS — R0683 Snoring: Secondary | ICD-10-CM

## 2019-09-19 ENCOUNTER — Other Ambulatory Visit: Payer: Self-pay

## 2019-09-19 ENCOUNTER — Ambulatory Visit (HOSPITAL_BASED_OUTPATIENT_CLINIC_OR_DEPARTMENT_OTHER): Payer: BC Managed Care – PPO | Attending: Family Medicine | Admitting: Internal Medicine

## 2019-09-19 DIAGNOSIS — G4730 Sleep apnea, unspecified: Secondary | ICD-10-CM

## 2019-09-19 DIAGNOSIS — R0902 Hypoxemia: Secondary | ICD-10-CM | POA: Insufficient documentation

## 2019-09-19 DIAGNOSIS — R0683 Snoring: Secondary | ICD-10-CM

## 2019-09-19 DIAGNOSIS — R5383 Other fatigue: Secondary | ICD-10-CM

## 2019-09-19 DIAGNOSIS — G4733 Obstructive sleep apnea (adult) (pediatric): Secondary | ICD-10-CM | POA: Diagnosis not present

## 2019-09-28 DIAGNOSIS — G4733 Obstructive sleep apnea (adult) (pediatric): Secondary | ICD-10-CM | POA: Insufficient documentation

## 2019-10-05 DIAGNOSIS — R0683 Snoring: Secondary | ICD-10-CM | POA: Diagnosis not present

## 2019-10-05 NOTE — Procedures (Signed)
    Patient Name: Sandra Wong, Labo Date: 09/19/2019 Gender: Female D.O.B: 1986-03-19 Age (years): 33 Referring Provider: Leilani Able Height (inches): 69 Interpreting Physician: Jetty Duhamel MD, ABSM Weight (lbs): 364 RPSGT: [place] BMI: 54 MRN: [id number] Neck Size: 16.50  CLINICAL INFORMATION Sleep Study Type: HST Indication for sleep study: OSA Epworth Sleepiness Score: 22  SLEEP STUDY TECHNIQUE A multi-channel overnight portable sleep study was performed. The channels recorded were: nasal airflow, thoracic respiratory movement, and oxygen saturation with a pulse oximetry. Snoring was also monitored.  MEDICATIONS Patient self administered medications include: none reported.  SLEEP ARCHITECTURE Patient was studied for 369 minutes. The sleep efficiency was 92.4 % and the patient was supine for 42.1%. The arousal index was 0.0 per hour.  RESPIRATORY PARAMETERS The overall AHI was 25.0 per hour, with a central apnea index of 0.0 per hour. The oxygen nadir was 62% during sleep.  CARDIAC DATA Mean heart rate during sleep was 98.8 bpm.  IMPRESSIONS - Moderate obstructive sleep apnea occurred during this study (AHI = 25.0/h). - No significant central sleep apnea occurred during this study (CAI = 0.0/h). - Oxygen desaturation was noted during this study (Min O2 = 62%). Mean sat 91%. - Time with O2 saturation 89% or lesss was 53.9 minutes. - Patient snored .  - Obstructive Sleep Apnea (327.23 [G47.33 ICD-10]) - Nocturnal Hypoxemia (327.26 [G47.36 ICD-10])  RECOMMENDATIONS - Suggest CPAP titration sleep study or autopap. Other options would b ebased on clinical judgment. - Be careful with alcohol, sedatives and other CNS depressants that may worsen sleep apnea and disrupt normal sleep architecture. - Sleep hygiene should be reviewed to assess factors that may improve sleep quality. - Weight management and regular exercise should be initiated or  continued.  [Electronically signed] 10/05/2019 10:46 AM  Jetty Duhamel MD, ABSM Diplomate, American Board of Sleep Medicine   NPI: [id number]                        Jetty Duhamel Diplomate, American Board of Sleep Medicine  ELECTRONICALLY SIGNED ON:  10/05/2019, 10:42 AM [facility] PH: [phone]   FX: [phone] ACCREDITED BY THE AMERICAN ACADEMY OF SLEEP MEDICINE

## 2020-04-08 ENCOUNTER — Encounter: Payer: Self-pay | Admitting: Emergency Medicine

## 2020-04-08 ENCOUNTER — Ambulatory Visit
Admission: EM | Admit: 2020-04-08 | Discharge: 2020-04-08 | Disposition: A | Discharge status: Home / Self Care | Payer: BC Managed Care – PPO

## 2020-04-08 ENCOUNTER — Other Ambulatory Visit: Payer: Self-pay

## 2020-04-08 DIAGNOSIS — M79675 Pain in left toe(s): Secondary | ICD-10-CM

## 2020-04-08 MED ORDER — PREDNISONE 20 MG PO TABS: 20.0000 mg | ORAL_TABLET | Freq: Every day | ORAL | 0 refills | Status: DC

## 2020-04-08 NOTE — ED Provider Notes (Signed)
[name]    CSN: [id number] Arrival date & time: 04/08/20  1244      History   Chief Complaint Chief Complaint  Patient presents with  . Foot Pain    HPI Sandra Wong is a 34 y.o. female with history of obesity, hypertension, on HCTZ, presenting for left great toe pain, swelling, redness.  States has been ongoing for the last 3 days: No injury or trauma.  No recent change in medications: Has been on HCTZ "for years".  Denies recent change in diet/lifestyle.  Denies history of gout.   Past Medical History:  Diagnosis Date  . Abortion   . Anemia 2009   s/p delivery  . Anxiety   . Chlamydia   . Chronic back pain   . Depression   . Gonorrhea   . Headache(784.0)    no treatment occasional headache  . Obesity   . Sleep apnea    per patient, no study    Patient Active Problem List   Diagnosis Date Noted  . OSA (obstructive sleep apnea) 09/28/2019  . Morbid obesity (HCC) 04/29/2015  . Anxiety 03/28/2015  . Depression 03/28/2015  . Migraine with aura 06/18/2013    Past Surgical History:  Procedure Laterality Date  . DILATION AND CURETTAGE OF UTERUS    . INDUCED ABORTION    . LAPAROSCOPIC TUBAL LIGATION Bilateral 01/15/2016   Procedure: LAPAROSCOPIC BILATERAL TUBAL LIGATION;  Surgeon: Tereso Newcomer, MD;  Location: [facility];  Service: Gynecology;  Laterality: Bilateral;    OB History    Gravida  4   Para  3   Term  2   Preterm  1   AB  1   Living  3     SAB  0   TAB  0   Ectopic  0   Multiple  0   Live Births  3        Obstetric Comments  2015 - attempted magnesium, SROM, rapid delivery, postpartum hemorrhage - cytotec         Home Medications    Prior to Admission medications   Medication Sig Start Date End Date Taking? Authorizing Provider  hydrochlorothiazide (HYDRODIURIL) 25 MG tablet Take 25 mg by mouth daily.   Yes [provider]  Multiple Vitamin (MULTIVITAMIN WITH MINERALS) TABS tablet Take 1  tablet by mouth daily.    [provider]  predniSONE (DELTASONE) 20 MG tablet Take 1 tablet (20 mg total) by mouth daily. 04/08/20   Hall-Potvin, [place], PA-C    Family History Family History  Problem Relation Age of Onset  . Hypertension Mother   . Hypertension Paternal Aunt   . Cancer Maternal Grandfather   . Stroke Maternal Grandfather   . Hypertension Father   . Asthma Son   . Kidney disease Paternal Grandmother   . Kidney disease Paternal Grandfather     Social History Social History   Tobacco Use  . Smoking status: Former Smoker    Packs/day: 0.25    Years: 3.00    Pack years: 0.75    Types: Cigarettes    Quit date: 06/08/2013    Years since quitting: 6.8  . Smokeless tobacco: Never Used  Vaping Use  . Vaping Use: Never used  Substance Use Topics  . Alcohol use: Not Currently    Comment: occasional when not pregnant   . Drug use: No     Allergies   Other   Review of Systems As per HPI  Physical Exam Triage Vital Signs ED Triage Vitals  Enc Vitals Group     BP 04/08/20 1256 125/86     Pulse Rate 04/08/20 1256 81     Resp 04/08/20 1256 18     Temp 04/08/20 1256 98.3 F (36.8 C)     Temp Source 04/08/20 1256 Oral     SpO2 04/08/20 1256 97 %     Weight --      Height --      Head Circumference --      Peak Flow --      Pain Score 04/08/20 1257 0     Pain Loc --      Pain Edu? --      Excl. in GC? --    No data found.  Updated Vital Signs BP 125/86 (BP Location: Left Arm)   Pulse 81   Temp 98.3 F (36.8 C) (Oral)   Resp 18   LMP 04/01/2020 Comment: tubal ligation  SpO2 97%   Visual Acuity Right Eye Distance:   Left Eye Distance:   Bilateral Distance:    Right Eye Near:   Left Eye Near:    Bilateral Near:     Physical Exam Constitutional:      General: She is not in acute distress. HENT:     Head: Normocephalic and atraumatic.  Eyes:     General: No scleral icterus.    Pupils: Pupils are equal, round, and reactive  to light.  Cardiovascular:     Rate and Rhythm: Normal rate.  Pulmonary:     Effort: Pulmonary effort is normal.  Musculoskeletal:        General: Swelling and tenderness present. Normal range of motion.     Comments: Left great toe MTP  Skin:    Coloration: Skin is not jaundiced or pale.     Findings: Erythema present.  Neurological:     Mental Status: She is alert and oriented to person, place, and time.      UC Treatments / Results  Labs (all labs ordered are listed, but only abnormal results are displayed) Labs Reviewed - No data to display  EKG   Radiology No results found.  Procedures Procedures (including critical care time)  Medications Ordered in UC Medications - No data to display  Initial Impression / Assessment and Plan / UC Course  I have reviewed the triage vital signs and the nursing notes.  Pertinent labs & imaging results that were available during my care of the patient were reviewed by me and considered in my medical decision making (see chart for details).     H&P concerning for gout: We will start prednisone, patient follow-up with PCP for further evaluation management.  Will not alter dosing of HCTZ at this time.  Return precautions discussed, patient verbalized understanding and is agreeable to plan. Final Clinical Impressions(s) / UC Diagnoses   Final diagnoses:  Great toe pain, left     Discharge Instructions     Take prednisone once a day with breakfast. Follow up with PCP for further evaluation.    ED Prescriptions    Medication Sig Dispense Auth. Provider   predniSONE (DELTASONE) 20 MG tablet Take 1 tablet (20 mg total) by mouth daily. 5 tablet Hall-Potvin, [place], PA-C     PDMP not reviewed this encounter.   Hall-Potvin, [place], [place] 04/08/20 1545

## 2020-04-08 NOTE — Discharge Instructions (Addendum)
Take prednisone once a day with breakfast. Follow up with PCP for further evaluation.

## 2020-04-08 NOTE — ED Triage Notes (Signed)
Pt presents to [facility] for assessment of left foot swelling, numbness and pain x 3 days.  PAtient states she woke up to it, denies known injury.

## 2020-08-28 ENCOUNTER — Encounter (HOSPITAL_COMMUNITY): Payer: Self-pay | Admitting: Emergency Medicine

## 2020-08-28 ENCOUNTER — Emergency Department (HOSPITAL_COMMUNITY)
Admission: EM | Admit: 2020-08-28 | Discharge: 2020-08-28 | Disposition: A | Discharge status: Home / Self Care | Payer: Medicaid Other | Attending: Emergency Medicine | Admitting: Emergency Medicine

## 2020-08-28 DIAGNOSIS — Z87891 Personal history of nicotine dependence: Secondary | ICD-10-CM | POA: Insufficient documentation

## 2020-08-28 DIAGNOSIS — R519 Headache, unspecified: Secondary | ICD-10-CM | POA: Diagnosis present

## 2020-08-28 MED ORDER — DEXAMETHASONE SODIUM PHOSPHATE 10 MG/ML IJ SOLN
10.0000 mg | Freq: Once | INTRAMUSCULAR | Status: AC
  Administered 2020-08-28: 10 mg via INTRAVENOUS
  Filled 2020-08-28: qty 1

## 2020-08-28 MED ORDER — KETOROLAC TROMETHAMINE 30 MG/ML IJ SOLN
30.0000 mg | Freq: Once | INTRAMUSCULAR | Status: AC
  Administered 2020-08-28: 30 mg via INTRAVENOUS
  Filled 2020-08-28: qty 1

## 2020-08-28 MED ORDER — SODIUM CHLORIDE 0.9 % IV BOLUS
1000.0000 mL | Freq: Once | INTRAVENOUS | Status: AC
  Administered 2020-08-28: 1000 mL via INTRAVENOUS

## 2020-08-28 MED ORDER — METOCLOPRAMIDE HCL 5 MG/ML IJ SOLN
10.0000 mg | Freq: Once | INTRAMUSCULAR | Status: AC
  Administered 2020-08-28: 10 mg via INTRAVENOUS
  Filled 2020-08-28: qty 2

## 2020-08-28 MED ORDER — DIPHENHYDRAMINE HCL 25 MG PO CAPS
25.0000 mg | ORAL_CAPSULE | Freq: Once | ORAL | Status: AC
  Administered 2020-08-28: 25 mg via ORAL
  Filled 2020-08-28: qty 1

## 2020-08-28 NOTE — ED Triage Notes (Signed)
Pt arrives with c/o circumferential headache x3 days, has been taking ibuprofen without relief. Endorses photosensitivity and blurred vision. A/ox4, speech clear, no neuro deficits.

## 2020-08-28 NOTE — ED Provider Notes (Signed)
[facility] EMERGENCY DEPARTMENT Provider Note   CSN: [id number] Arrival date & time: 08/28/20  1191     History Chief Complaint  Patient presents with  . Headache    Sandra Wong is a 34 y.o. female.  34 year old female presents with complaint of headache x 3 days.  Patient reports global headache which is aching, throbbing, dull and sharp in nature, worse with lights and sounds, minimal improvement with ibuprofen at home.  Denies fevers, chills, body aches, sick contacts, nausea, vomiting.  Reports similar headaches in the past however states headaches do not typically last this long.  No other complaints or concerns today.        Past Medical History:  Diagnosis Date  . Abortion   . Anemia 2009   s/p delivery  . Anxiety   . Chlamydia   . Chronic back pain   . Depression   . Gonorrhea   . Headache(784.0)    no treatment occasional headache  . Obesity   . Sleep apnea    per patient, no study    Patient Active Problem List   Diagnosis Date Noted  . OSA (obstructive sleep apnea) 09/28/2019  . Morbid obesity (HCC) 04/29/2015  . Anxiety 03/28/2015  . Depression 03/28/2015  . Migraine with aura 06/18/2013    Past Surgical History:  Procedure Laterality Date  . DILATION AND CURETTAGE OF UTERUS    . INDUCED ABORTION    . LAPAROSCOPIC TUBAL LIGATION Bilateral 01/15/2016   Procedure: LAPAROSCOPIC BILATERAL TUBAL LIGATION;  Surgeon: Tereso Newcomer, MD;  Location: [facility];  Service: Gynecology;  Laterality: Bilateral;     OB History    Gravida  4   Para  3   Term  2   Preterm  1   AB  1   Living  3     SAB  0   TAB  0   Ectopic  0   Multiple  0   Live Births  3        Obstetric Comments  2015 - attempted magnesium, SROM, rapid delivery, postpartum hemorrhage - cytotec        Family History  Problem Relation Age of Onset  . Hypertension Mother   . Hypertension Paternal Aunt   . Cancer Maternal Grandfather   .  Stroke Maternal Grandfather   . Hypertension Father   . Asthma Son   . Kidney disease Paternal Grandmother   . Kidney disease Paternal Grandfather     Social History   Tobacco Use  . Smoking status: Former Smoker    Packs/day: 0.25    Years: 3.00    Pack years: 0.75    Types: Cigarettes    Quit date: 06/08/2013    Years since quitting: 7.2  . Smokeless tobacco: Never Used  Vaping Use  . Vaping Use: Never used  Substance Use Topics  . Alcohol use: Not Currently    Comment: occasional when not pregnant   . Drug use: No    Home Medications Prior to Admission medications   Medication Sig Start Date End Date Taking? Authorizing Provider  hydrochlorothiazide (HYDRODIURIL) 25 MG tablet Take 25 mg by mouth daily.    [provider]  Multiple Vitamin (MULTIVITAMIN WITH MINERALS) TABS tablet Take 1 tablet by mouth daily.    [provider]  predniSONE (DELTASONE) 20 MG tablet Take 1 tablet (20 mg total) by mouth daily. 04/08/20   Hall-Potvin, [place], PA-C    Allergies  Other  Review of Systems   Review of Systems  Constitutional: Negative for chills and fever.  HENT: Negative for congestion.   Eyes: Positive for photophobia and pain.  Respiratory: Negative for cough and shortness of breath.   Cardiovascular: Negative for chest pain.  Gastrointestinal: Negative for nausea and vomiting.  Genitourinary: Negative for dysuria.  Musculoskeletal: Negative for arthralgias, gait problem, myalgias, neck pain and neck stiffness.  Skin: Negative for rash and wound.  Allergic/Immunologic: Negative for immunocompromised state.  Neurological: Positive for headaches. Negative for speech difficulty, weakness and numbness.  Psychiatric/Behavioral: Negative for confusion.  All other systems reviewed and are negative.   Physical Exam Updated Vital Signs BP 122/85 (BP Location: Right Arm)   Pulse 77   Temp 98.1 F (36.7 C) (Oral)   Resp 18   Ht 5\' 9"  (1.753 m)   Wt  (!) 146.5 kg   SpO2 100%   BMI 47.70 kg/m   Physical Exam Vitals and nursing note reviewed.  Constitutional:      General: She is not in acute distress.    Appearance: She is well-developed. She is not diaphoretic.  HENT:     Head: Normocephalic and atraumatic.     Mouth/Throat:     Mouth: Mucous membranes are moist.  Eyes:     Extraocular Movements: Extraocular movements intact.     Pupils: Pupils are equal, round, and reactive to light.  Cardiovascular:     Rate and Rhythm: Normal rate and regular rhythm.     Heart sounds: Normal heart sounds.  Pulmonary:     Effort: Pulmonary effort is normal.     Breath sounds: Normal breath sounds.  Musculoskeletal:     Cervical back: Neck supple.  Neurological:     Mental Status: She is alert and oriented to person, place, and time.     GCS: GCS eye subscore is 4. GCS verbal subscore is 5. GCS motor subscore is 6.     Cranial Nerves: No cranial nerve deficit or facial asymmetry.     Sensory: No sensory deficit.     Motor: No weakness.  Psychiatric:        Behavior: Behavior normal.     ED Results / Procedures / Treatments   Labs (all labs ordered are listed, but only abnormal results are displayed) Labs Reviewed - No data to display  EKG None  Radiology No results found.  Procedures Procedures (including critical care time)  Medications Ordered in ED Medications  sodium chloride 0.9 % bolus 1,000 mL (1,000 mLs Intravenous New Bag/Given 08/28/20 1422)  ketorolac (TORADOL) 30 MG/ML injection 30 mg (30 mg Intravenous Given 08/28/20 1423)  dexamethasone (DECADRON) injection 10 mg (10 mg Intravenous Given 08/28/20 1423)  metoCLOPramide (REGLAN) injection 10 mg (10 mg Intravenous Given 08/28/20 1423)  diphenhydrAMINE (BENADRYL) capsule 25 mg (25 mg Oral Given 08/28/20 1423)    ED Course  I have reviewed the triage vital signs and the nursing notes.  Pertinent labs & imaging results that were available during my care of the  patient were reviewed by me and considered in my medical decision making (see chart for details).  Clinical Course as of Aug 28 1500  Thu Aug 28, 2020  [id number] 34 year old female with complaint of headache x3 days.  Exam is unremarkable.  Plan is to treat headache with fluids and headache cocktail and reassess.   [LM]  1451 Pain is improving, plan is to finish IV fluids and discharge.    [LM]  Clinical Course User Index [LM] Alden Hipp   MDM Rules/Calculators/A&P                          Final Clinical Impression(s) / ED Diagnoses Final diagnoses:  Nonintractable episodic headache, unspecified headache type    Rx / DC Orders ED Discharge Orders    None       Jeannie Fend, PA-C 08/28/20 1501    Alvira Monday, MD 08/29/20 [phone]

## 2020-08-28 NOTE — Discharge Instructions (Addendum)
Home to rest. Take Motrin and Tylenol as needed as directed for headache. Recheck with your doctor if headaches continue.

## 2021-01-10 ENCOUNTER — Ambulatory Visit
Admission: EM | Admit: 2021-01-10 | Discharge: 2021-01-10 | Disposition: A | Discharge status: Home / Self Care | Payer: Medicaid Other | Attending: Emergency Medicine | Admitting: Emergency Medicine

## 2021-01-10 ENCOUNTER — Encounter: Payer: Self-pay | Admitting: Emergency Medicine

## 2021-01-10 ENCOUNTER — Other Ambulatory Visit: Payer: Self-pay

## 2021-01-10 DIAGNOSIS — R300 Dysuria: Secondary | ICD-10-CM | POA: Insufficient documentation

## 2021-01-10 LAB — POCT URINALYSIS DIP (MANUAL ENTRY)
Bilirubin, UA: NEGATIVE
Glucose, UA: NEGATIVE mg/dL
Ketones, POC UA: NEGATIVE mg/dL
Nitrite, UA: NEGATIVE
Protein Ur, POC: >=300 mg/dL — AB
Spec Grav, UA: >=1.03 — AB (ref 1.010–1.025)
Urobilinogen, UA: 1 E.U./dL
pH, UA: 6 (ref 5.0–8.0)

## 2021-01-10 MED ORDER — NITROFURANTOIN MONOHYD MACRO 100 MG PO CAPS: 100.0000 mg | ORAL_CAPSULE | Freq: Two times a day (BID) | ORAL | 0 refills | Status: AC

## 2021-01-10 NOTE — ED Triage Notes (Addendum)
Patient c/o dysuria x 1 day.   Patient endorses back pain. Patient endorses "cloudy , bubbles in urine".   Patient denies ABD pain, hematuria, foul odor, N/V/D and abnormal vaginal discharge.   Patient hasn't used any medications for symptoms.

## 2021-01-10 NOTE — ED Provider Notes (Signed)
[name]    CSN: [id number] Arrival date & time: 01/10/21  1139      History   Chief Complaint Chief Complaint  Patient presents with  . Dysuria    HPI Sandra Wong is a 35 y.o. female history of migraines presenting today for evaluation of dysuria.  Reports burning with urination for approximately 1 day.  Associated back pain, cloudy urine.  Denies any abdominal pain, hematuria, vaginal discharge nausea vomiting or diarrhea.  Reports history of UTI and symptoms feel similar.  Does report associated external itching, but denies discharge.  HPI  Past Medical History:  Diagnosis Date  . Abortion   . Anemia 2009   s/p delivery  . Anxiety   . Chlamydia   . Chronic back pain   . Depression   . Gonorrhea   . Headache(784.0)    no treatment occasional headache  . Obesity   . Sleep apnea    per patient, no study    Patient Active Problem List   Diagnosis Date Noted  . OSA (obstructive sleep apnea) 09/28/2019  . Morbid obesity (HCC) 04/29/2015  . Anxiety 03/28/2015  . Depression 03/28/2015  . Migraine with aura 06/18/2013    Past Surgical History:  Procedure Laterality Date  . DILATION AND CURETTAGE OF UTERUS    . INDUCED ABORTION    . LAPAROSCOPIC TUBAL LIGATION Bilateral 01/15/2016   Procedure: LAPAROSCOPIC BILATERAL TUBAL LIGATION;  Surgeon: Tereso Newcomer, MD;  Location: [facility];  Service: Gynecology;  Laterality: Bilateral;    OB History    Gravida  4   Para  3   Term  2   Preterm  1   AB  1   Living  3     SAB  0   IAB  0   Ectopic  0   Multiple  0   Live Births  3        Obstetric Comments  2015 - attempted magnesium, SROM, rapid delivery, postpartum hemorrhage - cytotec         Home Medications    Prior to Admission medications   Medication Sig Start Date End Date Taking? Authorizing Provider  cetirizine (ZYRTEC) 10 MG tablet Take 10 mg by mouth.   Yes [provider]  hydrochlorothiazide  (HYDRODIURIL) 25 MG tablet Take 25 mg by mouth daily.   Yes [provider]  nitrofurantoin, macrocrystal-monohydrate, (MACROBID) 100 MG capsule Take 1 capsule (100 mg total) by mouth 2 (two) times daily for 5 days. 01/10/21 01/15/21 Yes Jarvis Sawa C, PA-C  Multiple Vitamin (MULTIVITAMIN WITH MINERALS) TABS tablet Take 1 tablet by mouth daily.    [provider]    Family History Family History  Problem Relation Age of Onset  . Hypertension Mother   . Hypertension Paternal Aunt   . Cancer Maternal Grandfather   . Stroke Maternal Grandfather   . Hypertension Father   . Asthma Son   . Kidney disease Paternal Grandmother   . Kidney disease Paternal Grandfather     Social History Social History   Tobacco Use  . Smoking status: Former Smoker    Packs/day: 0.25    Years: 3.00    Pack years: 0.75    Types: Cigarettes    Quit date: 06/08/2013    Years since quitting: 7.5  . Smokeless tobacco: Never Used  Vaping Use  . Vaping Use: Never used  Substance Use Topics  . Alcohol use: Not Currently    Comment: occasional  when not pregnant   . Drug use: No     Allergies   Other   Review of Systems Review of Systems  Constitutional: Negative for fever.  Respiratory: Negative for shortness of breath.   Cardiovascular: Negative for chest pain.  Gastrointestinal: Negative for abdominal pain, diarrhea, nausea and vomiting.  Genitourinary: Positive for dysuria. Negative for flank pain, genital sores, hematuria, menstrual problem, vaginal bleeding, vaginal discharge and vaginal pain.  Musculoskeletal: Negative for back pain.  Skin: Negative for rash.  Neurological: Negative for dizziness, light-headedness and headaches.     Physical Exam Triage Vital Signs ED Triage Vitals  Enc Vitals Group     BP 01/10/21 1211 103/76     Pulse Rate 01/10/21 1211 85     Resp 01/10/21 1211 17     Temp 01/10/21 1211 98.8 F (37.1 C)     Temp Source 01/10/21 1211 Oral      SpO2 01/10/21 1211 96 %     Weight --      Height --      Head Circumference --      Peak Flow --      Pain Score 01/10/21 1208 5     Pain Loc --      Pain Edu? --      Excl. in GC? --    No data found.  Updated Vital Signs BP 103/76 (BP Location: Left Arm)   Pulse 85   Temp 98.8 F (37.1 C) (Oral)   Resp 17   LMP 01/03/2021   SpO2 96%   Visual Acuity Right Eye Distance:   Left Eye Distance:   Bilateral Distance:    Right Eye Near:   Left Eye Near:    Bilateral Near:     Physical Exam Vitals and nursing note reviewed.  Constitutional:      Appearance: She is well-developed.     Comments: No acute distress  HENT:     Head: Normocephalic and atraumatic.     Nose: Nose normal.  Eyes:     Conjunctiva/sclera: Conjunctivae normal.  Cardiovascular:     Rate and Rhythm: Normal rate.  Pulmonary:     Effort: Pulmonary effort is normal. No respiratory distress.  Abdominal:     General: There is no distension.  Musculoskeletal:        General: Normal range of motion.     Cervical back: Neck supple.  Skin:    General: Skin is warm and dry.  Neurological:     Mental Status: She is alert and oriented to person, place, and time.      UC Treatments / Results  Labs (all labs ordered are listed, but only abnormal results are displayed) Labs Reviewed  POCT URINALYSIS DIP (MANUAL ENTRY) - Abnormal; Notable for the following components:      Result Value   Spec Grav, UA >=1.030 (*)    Blood, UA trace-intact (*)    Protein Ur, POC >=300 (*)    Leukocytes, UA Trace (*)    All other components within normal limits  URINE CULTURE  CERVICOVAGINAL ANCILLARY ONLY    EKG   Radiology No results found.  Procedures Procedures (including critical care time)  Medications Ordered in UC Medications - No data to display  Initial Impression / Assessment and Plan / UC Course  I have reviewed the triage vital signs and the nursing notes.  Pertinent labs & imaging results  that were available during my care of the patient were reviewed  by me and considered in my medical decision making (see chart for details).     Trace leuks, trace hemoglobin, urine culture pending.  Given patient reported history of UTIs feeling similar we will go ahead and initiate treatment for UTI with Macrobid, but discussed possibility of vaginitis as well, vaginal swab pending.  Wound culture and swab returned will alter therapy based off results.  Discussed strict return precautions. Patient verbalized understanding and is agreeable with plan.  Final Clinical Impressions(s) / UC Diagnoses   Final diagnoses:  Dysuria     Discharge Instructions     Urine culture pending, vaginal swab pending Begin Macrobid twice daily for 5 days to treat UTI Drink plenty of water We will call if we need to change medicines    ED Prescriptions    Medication Sig Dispense Auth. Provider   nitrofurantoin, macrocrystal-monohydrate, (MACROBID) 100 MG capsule Take 1 capsule (100 mg total) by mouth 2 (two) times daily for 5 days. 10 capsule Trevor Wilkie, [place] C, PA-C     PDMP not reviewed this encounter.   Lew Dawes, PA-C 01/10/21 1246

## 2021-01-10 NOTE — Discharge Instructions (Addendum)
Urine culture pending, vaginal swab pending Begin Macrobid twice daily for 5 days to treat UTI Drink plenty of water We will call if we need to change medicines

## 2021-01-11 LAB — URINE CULTURE: Culture: NO GROWTH

## 2021-01-12 ENCOUNTER — Telehealth (HOSPITAL_COMMUNITY): Payer: Self-pay | Admitting: Emergency Medicine

## 2021-01-12 ENCOUNTER — Telehealth: Payer: Self-pay

## 2021-01-12 ENCOUNTER — Ambulatory Visit
Admission: EM | Admit: 2021-01-12 | Discharge: 2021-01-12 | Disposition: A | Discharge status: Home / Self Care | Payer: Medicaid Other

## 2021-01-12 LAB — CERVICOVAGINAL ANCILLARY ONLY
Bacterial Vaginitis (gardnerella): NEGATIVE
Candida Glabrata: NEGATIVE
Candida Vaginitis: NEGATIVE
Chlamydia: NEGATIVE
Comment: NEGATIVE
Comment: NEGATIVE
Comment: NEGATIVE
Comment: NEGATIVE
Comment: NEGATIVE
Comment: NORMAL
Neisseria Gonorrhea: NEGATIVE
Trichomonas: POSITIVE — AB

## 2021-01-12 MED ORDER — METRONIDAZOLE 500 MG PO TABS: 500.0000 mg | ORAL_TABLET | Freq: Two times a day (BID) | ORAL | 0 refills | Status: DC

## 2021-01-12 NOTE — Telephone Encounter (Signed)
Metronidazole sent to cover trichomoniasis seen on recent cytology

## 2021-03-20 ENCOUNTER — Ambulatory Visit: Admit: 2021-03-20 | Disposition: A | Discharge status: Home / Self Care | Payer: Medicaid Other

## 2021-03-21 ENCOUNTER — Other Ambulatory Visit: Payer: Self-pay

## 2021-03-21 ENCOUNTER — Ambulatory Visit
Admission: EM | Admit: 2021-03-21 | Discharge: 2021-03-21 | Disposition: A | Discharge status: Home / Self Care | Payer: Medicaid Other | Attending: Emergency Medicine | Admitting: Emergency Medicine

## 2021-03-21 DIAGNOSIS — N760 Acute vaginitis: Secondary | ICD-10-CM | POA: Diagnosis present

## 2021-03-21 LAB — POCT URINALYSIS DIP (MANUAL ENTRY)
Bilirubin, UA: NEGATIVE
Glucose, UA: NEGATIVE mg/dL
Ketones, POC UA: NEGATIVE mg/dL
Leukocytes, UA: NEGATIVE
Nitrite, UA: NEGATIVE
Protein Ur, POC: >=300 mg/dL — AB
Spec Grav, UA: >=1.03 — AB (ref 1.010–1.025)
Urobilinogen, UA: 0.2 E.U./dL
pH, UA: 6.5 (ref 5.0–8.0)

## 2021-03-21 LAB — POCT URINE PREGNANCY: Preg Test, Ur: NEGATIVE

## 2021-03-21 MED ORDER — FLUCONAZOLE 150 MG PO TABS: 150.0000 mg | ORAL_TABLET | Freq: Once | ORAL | 0 refills | Status: AC

## 2021-03-21 MED ORDER — TRIAMCINOLONE ACETONIDE 0.1 % EX CREA: 1.0000 "application " | TOPICAL_CREAM | Freq: Two times a day (BID) | CUTANEOUS | 0 refills | Status: DC

## 2021-03-21 NOTE — ED Provider Notes (Signed)
[name]    CSN: [id number] Arrival date & time: 03/21/21  6269      History   Chief Complaint Chief Complaint  Patient presents with   Vaginal Itching   vagin irritation    HPI Sandra Wong is a 35 y.o. female presenting today for evaluation of vaginal discharge.  Reports over the past few days has had itching and irritation to clitoral area.  She reports some white discharge around this area, but denies any vaginal discharge.  She has had some slight abdominal discomfort which has been intermittent.  Denies urinary symptoms.  Denies history of yeast or BV.  Does admit to new partner recently.  HPI  Past Medical History:  Diagnosis Date   Abortion    Anemia 2009   s/p delivery   Anxiety    Chlamydia    Chronic back pain    Depression    Gonorrhea    Headache(784.0)    no treatment occasional headache   Obesity    Sleep apnea    per patient, no study    Patient Active Problem List   Diagnosis Date Noted   OSA (obstructive sleep apnea) 09/28/2019   Morbid obesity (HCC) 04/29/2015   Anxiety 03/28/2015   Depression 03/28/2015   Migraine with aura 06/18/2013    Past Surgical History:  Procedure Laterality Date   DILATION AND CURETTAGE OF UTERUS     INDUCED ABORTION     LAPAROSCOPIC TUBAL LIGATION Bilateral 01/15/2016   Procedure: LAPAROSCOPIC BILATERAL TUBAL LIGATION;  Surgeon: Tereso Newcomer, MD;  Location: [facility];  Service: Gynecology;  Laterality: Bilateral;    OB History     Gravida  4   Para  3   Term  2   Preterm  1   AB  1   Living  3      SAB  0   IAB  0   Ectopic  0   Multiple  0   Live Births  3        Obstetric Comments  2015 - attempted magnesium, SROM, rapid delivery, postpartum hemorrhage - cytotec          Home Medications    Prior to Admission medications   Medication Sig Start Date End Date Taking? Authorizing Provider  fluconazole (DIFLUCAN) 150 MG tablet Take 1 tablet (150 mg total)  by mouth once for 1 dose. 03/21/21 03/21/21 Yes Regan Llorente C, PA-C  triamcinolone cream (KENALOG) 0.1 % Apply 1 application topically 2 (two) times daily. 03/21/21  Yes Megahn Killings C, PA-C  cetirizine (ZYRTEC) 10 MG tablet Take 10 mg by mouth.    [provider]  hydrochlorothiazide (HYDRODIURIL) 25 MG tablet Take 25 mg by mouth daily.    [provider]  Multiple Vitamin (MULTIVITAMIN WITH MINERALS) TABS tablet Take 1 tablet by mouth daily.    [provider]    Family History Family History  Problem Relation Age of Onset   Hypertension Mother    Hypertension Paternal Aunt    Cancer Maternal Grandfather    Stroke Maternal Grandfather    Hypertension Father    Asthma Son    Kidney disease Paternal Grandmother    Kidney disease Paternal Grandfather     Social History Social History   Tobacco Use   Smoking status: Former    Packs/day: 0.25    Years: 3.00    Pack years: 0.75    Types: Cigarettes    Quit date: 06/08/2013  Years since quitting: 7.7   Smokeless tobacco: Never  Vaping Use   Vaping Use: Never used  Substance Use Topics   Alcohol use: Not Currently    Comment: occasional when not pregnant    Drug use: No     Allergies   Other   Review of Systems Review of Systems  Constitutional:  Negative for fever.  Respiratory:  Negative for shortness of breath.   Cardiovascular:  Negative for chest pain.  Gastrointestinal:  Negative for abdominal pain, diarrhea, nausea and vomiting.  Genitourinary:  Positive for vaginal discharge. Negative for dysuria, flank pain, genital sores, hematuria, menstrual problem, vaginal bleeding and vaginal pain.  Musculoskeletal:  Negative for back pain.  Skin:  Negative for rash.  Neurological:  Negative for dizziness, light-headedness and headaches.    Physical Exam Triage Vital Signs ED Triage Vitals  Enc Vitals Group     BP      Pulse      Resp      Temp      Temp src      SpO2       Weight      Height      Head Circumference      Peak Flow      Pain Score      Pain Loc      Pain Edu?      Excl. in GC?    No data found.  Updated Vital Signs BP 116/81 (BP Location: Right Arm)   Pulse 82   Temp 98.1 F (36.7 C) (Oral)   Resp 18   LMP 02/25/2021 (Approximate)   SpO2 96%   Visual Acuity Right Eye Distance:   Left Eye Distance:   Bilateral Distance:    Right Eye Near:   Left Eye Near:    Bilateral Near:     Physical Exam Vitals and nursing note reviewed.  Constitutional:      Appearance: She is well-developed.     Comments: No acute distress  HENT:     Head: Normocephalic and atraumatic.     Nose: Nose normal.  Eyes:     Conjunctiva/sclera: Conjunctivae normal.  Cardiovascular:     Rate and Rhythm: Normal rate.  Pulmonary:     Effort: Pulmonary effort is normal. No respiratory distress.  Abdominal:     General: There is no distension.  Musculoskeletal:        General: Normal range of motion.     Cervical back: Neck supple.  Skin:    General: Skin is warm and dry.  Neurological:     Mental Status: She is alert and oriented to person, place, and time.     UC Treatments / Results  Labs (all labs ordered are listed, but only abnormal results are displayed) Labs Reviewed  POCT URINALYSIS DIP (MANUAL ENTRY) - Abnormal; Notable for the following components:      Result Value   Spec Grav, UA >=1.030 (*)    Blood, UA trace-intact (*)    Protein Ur, POC >=300 (*)    All other components within normal limits  POCT URINE PREGNANCY  CERVICOVAGINAL ANCILLARY ONLY    EKG   Radiology No results found.  Procedures Procedures (including critical care time)  Medications Ordered in UC Medications - No data to display  Initial Impression / Assessment and Plan / UC Course  I have reviewed the triage vital signs and the nursing notes.  Pertinent labs & imaging results that were available during  my care of the patient were reviewed by me and  considered in my medical decision making (see chart for details).     Vaginitis-empirically treating for yeast with Diflucan, also providing triamcinolone to use topically to help with any itching and irritation externally, discussed using over-the-counter clotrimazole cream for external treatment of yeast as well.  We will call with results if abnormal and alter therapy as needed.  Discussed strict return precautions. Patient verbalized understanding and is agreeable with plan.  Final Clinical Impressions(s) / UC Diagnoses   Final diagnoses:  Vaginitis and vulvovaginitis     Discharge Instructions      Vaginal swab pending 1 tab of Diflucan today to treat yeast, may repeat in 72 hours if still having symptoms May apply triamcinolone cream to help with itching/inflammation externally, may also use with an over-the-counter topical antifungal cream-clotrimazole  Please follow-up if any symptoms not improving or worsening     ED Prescriptions     Medication Sig Dispense Auth. Provider   fluconazole (DIFLUCAN) 150 MG tablet Take 1 tablet (150 mg total) by mouth once for 1 dose. 2 tablet Attila Mccarthy C, PA-C   triamcinolone cream (KENALOG) 0.1 % Apply 1 application topically 2 (two) times daily. 30 g Kadey Mihalic, [place] C, PA-C      PDMP not reviewed this encounter.   Lew Dawes, [place] 03/21/21 [phone]

## 2021-03-21 NOTE — ED Triage Notes (Signed)
Greater than one week h/o vaginal itching and irritation two days after engaging in intercourse. Confirms intermittent abdominal pains.  Denies vaginal discharge and odor. No hematuria and dysuria. No urinary frequency and urgency.

## 2021-03-21 NOTE — Discharge Instructions (Addendum)
Vaginal swab pending 1 tab of Diflucan today to treat yeast, may repeat in 72 hours if still having symptoms May apply triamcinolone cream to help with itching/inflammation externally, may also use with an over-the-counter topical antifungal cream-clotrimazole  Please follow-up if any symptoms not improving or worsening

## 2021-03-23 ENCOUNTER — Telehealth (HOSPITAL_COMMUNITY): Payer: Self-pay | Admitting: Emergency Medicine

## 2021-03-23 LAB — CERVICOVAGINAL ANCILLARY ONLY
Bacterial Vaginitis (gardnerella): POSITIVE — AB
Candida Glabrata: NEGATIVE
Candida Vaginitis: NEGATIVE
Chlamydia: NEGATIVE
Comment: NEGATIVE
Comment: NEGATIVE
Comment: NEGATIVE
Comment: NEGATIVE
Comment: NEGATIVE
Comment: NORMAL
Neisseria Gonorrhea: NEGATIVE
Trichomonas: NEGATIVE

## 2021-03-23 MED ORDER — METRONIDAZOLE 500 MG PO TABS: 500.0000 mg | ORAL_TABLET | Freq: Two times a day (BID) | ORAL | 0 refills | Status: DC

## 2021-05-11 ENCOUNTER — Encounter: Payer: Self-pay | Admitting: Emergency Medicine

## 2021-05-11 ENCOUNTER — Other Ambulatory Visit: Payer: Self-pay

## 2021-05-11 ENCOUNTER — Ambulatory Visit
Admission: EM | Admit: 2021-05-11 | Discharge: 2021-05-11 | Disposition: A | Discharge status: Home / Self Care | Payer: Medicaid Other | Attending: Emergency Medicine | Admitting: Emergency Medicine

## 2021-05-11 DIAGNOSIS — R21 Rash and other nonspecific skin eruption: Secondary | ICD-10-CM

## 2021-05-11 MED ORDER — PREDNISONE 10 MG (21) PO TBPK: ORAL_TABLET | Freq: Every day | ORAL | 0 refills | Status: DC

## 2021-05-11 MED ORDER — TRIAMCINOLONE ACETONIDE 0.1 % EX CREA: 1.0000 "application " | TOPICAL_CREAM | Freq: Two times a day (BID) | CUTANEOUS | 0 refills | Status: DC

## 2021-05-11 NOTE — ED Triage Notes (Signed)
Rash bilateral feet x 1 year. PCP gave triamcinolone cream, it improved, started up again 2 months prior. Now spreading to hands. Itches. Isolated circular flaking patches

## 2021-05-11 NOTE — Discharge Instructions (Addendum)
Take prednisone every morning with food as directed on packet  Continue use of cream twice a day on affected areas, focus mainly on ankles which is where is predominately present  May follow up with urgent care or with primary doctor for reevaluation of area in 2 weeks if no improvement seen    Can use over the counter antihistamine such as benadryl or Claritin to help with itching

## 2021-05-11 NOTE — ED Provider Notes (Signed)
[name]    CSN: [id number] Arrival date & time: 05/11/21  0836      History   Chief Complaint Chief Complaint  Patient presents with   Rash    HPI Sandra Wong is a 35 y.o. female.   Patient presents with rash flared up for two months present on bilateral ankles has begun to spread to bilateral thighs, minimally on abdomen and one lesion on neck. Rash is pruritic. Denies drainage, change in lotions, soaps, detergents, linens, clothing, diet, recent travel, recent illness, fever, chills. Initially began 1 year ago with presence on bilateral ankles only. Was given triamcinolone cream which provided relief, rash cleared after consistent use.   Past Medical History:  Diagnosis Date   Abortion    Anemia 2009   s/p delivery   Anxiety    Chlamydia    Chronic back pain    Depression    Gonorrhea    Headache(784.0)    no treatment occasional headache   Obesity    Sleep apnea    per patient, no study    Patient Active Problem List   Diagnosis Date Noted   OSA (obstructive sleep apnea) 09/28/2019   Morbid obesity (HCC) 04/29/2015   Anxiety 03/28/2015   Depression 03/28/2015   Migraine with aura 06/18/2013    Past Surgical History:  Procedure Laterality Date   DILATION AND CURETTAGE OF UTERUS     INDUCED ABORTION     LAPAROSCOPIC TUBAL LIGATION Bilateral 01/15/2016   Procedure: LAPAROSCOPIC BILATERAL TUBAL LIGATION;  Surgeon: Tereso Newcomer, MD;  Location: [facility];  Service: Gynecology;  Laterality: Bilateral;    OB History     Gravida  4   Para  3   Term  2   Preterm  1   AB  1   Living  3      SAB  0   IAB  0   Ectopic  0   Multiple  0   Live Births  3        Obstetric Comments  2015 - attempted magnesium, SROM, rapid delivery, postpartum hemorrhage - cytotec          Home Medications    Prior to Admission medications   Medication Sig Start Date End Date Taking? Authorizing Provider  predniSONE (STERAPRED  UNI-PAK 21 TAB) 10 MG (21) TBPK tablet Take by mouth daily. Take 6 tabs by mouth daily  for 2 days, then 5 tabs for 2 days, then 4 tabs for 2 days, then 3 tabs for 2 days, 2 tabs for 2 days, then 1 tab by mouth daily for 2 days 05/11/21  Yes Joon Pohle R, NP  cetirizine (ZYRTEC) 10 MG tablet Take 10 mg by mouth.    [provider]  hydrochlorothiazide (HYDRODIURIL) 25 MG tablet Take 25 mg by mouth daily.    [provider]  metroNIDAZOLE (FLAGYL) 500 MG tablet Take 1 tablet (500 mg total) by mouth 2 (two) times daily. 03/23/21   Merrilee Jansky, MD  Multiple Vitamin (MULTIVITAMIN WITH MINERALS) TABS tablet Take 1 tablet by mouth daily.    [provider]  triamcinolone cream (KENALOG) 0.1 % Apply 1 application topically 2 (two) times daily. 05/11/21   Valinda Hoar, NP    Family History Family History  Problem Relation Age of Onset   Hypertension Mother    Hypertension Paternal Aunt    Cancer Maternal Grandfather    Stroke Maternal Grandfather    Hypertension Father  Asthma Son    Kidney disease Paternal Grandmother    Kidney disease Paternal Grandfather     Social History Social History   Tobacco Use   Smoking status: Former    Packs/day: 0.25    Years: 3.00    Pack years: 0.75    Types: Cigarettes    Quit date: 06/08/2013    Years since quitting: 7.9   Smokeless tobacco: Never  Vaping Use   Vaping Use: Never used  Substance Use Topics   Alcohol use: Not Currently    Comment: occasional when not pregnant    Drug use: No     Allergies   Other   Review of Systems Review of Systems  Constitutional: Negative.   Respiratory: Negative.    Cardiovascular: Negative.   Musculoskeletal: Negative.   Skin:  Positive for rash. Negative for color change, pallor and wound.  Neurological: Negative.     Physical Exam Triage Vital Signs ED Triage Vitals  Enc Vitals Group     BP 05/11/21 0935 117/73     Pulse Rate 05/11/21 0935 74      Resp 05/11/21 0935 16     Temp 05/11/21 0935 97.9 F (36.6 C)     Temp Source 05/11/21 0935 Oral     SpO2 05/11/21 0935 100 %     Weight --      Height --      Head Circumference --      Peak Flow --      Pain Score 05/11/21 0936 0     Pain Loc --      Pain Edu? --      Excl. in GC? --    No data found.  Updated Vital Signs BP 117/73 (BP Location: Left Arm)   Pulse 74   Temp 97.9 F (36.6 C) (Oral)   Resp 16   SpO2 100%   Visual Acuity Right Eye Distance:   Left Eye Distance:   Bilateral Distance:    Right Eye Near:   Left Eye Near:    Bilateral Near:     Physical Exam Constitutional:      Appearance: Normal appearance. She is normal weight.  HENT:     Head: Normocephalic.  Eyes:     Extraocular Movements: Extraocular movements intact.  Pulmonary:     Effort: Pulmonary effort is normal.  Skin:    Comments: Dry flesh-toned to darkened papular rash present on bilateral ankles, diffusely and minimally on bilateral thighs and abdomen, one papule on anterior of neck, excoriations present throughout rash, no presence on back, no drainage noted, defer to photo below   Neurological:     Mental Status: She is alert. Mental status is at baseline.  Psychiatric:        Mood and Affect: Mood normal.        Behavior: Behavior normal.      UC Treatments / Results  Labs (all labs ordered are listed, but only abnormal results are displayed) Labs Reviewed - No data to display  EKG   Radiology No results found.  Procedures Procedures (including critical care time)  Medications Ordered in UC Medications - No data to display  Initial Impression / Assessment and Plan / UC Course  I have reviewed the triage vital signs and the nursing notes.  Pertinent labs & imaging results that were available during my care of the patient were reviewed by me and considered in my medical decision making (see chart for details).  Rash  Prednisone 60 mg taper daily with  food Triamcinolone cream 0.1% bid to affected area Otc antihistamine prn for itching Follow up in 2 weeks with UC or PCP for no improvement or worsening rash  Final Clinical Impressions(s) / UC Diagnoses   Final diagnoses:  Rash and nonspecific skin eruption     Discharge Instructions      Take prednisone every morning with food as directed on packet  Continue use of cream twice a day on affected areas, focus mainly on ankles which is where is predominately present  May follow up with urgent care or with primary doctor for reevaluation of area in 2 weeks if no improvement seen    Can use over the counter antihistamine such as benadryl or Claritin to help with itching      ED Prescriptions     Medication Sig Dispense Auth. Provider   predniSONE (STERAPRED UNI-PAK 21 TAB) 10 MG (21) TBPK tablet Take by mouth daily. Take 6 tabs by mouth daily  for 2 days, then 5 tabs for 2 days, then 4 tabs for 2 days, then 3 tabs for 2 days, 2 tabs for 2 days, then 1 tab by mouth daily for 2 days 42 tablet Dierdra Salameh R, NP   triamcinolone cream (KENALOG) 0.1 % Apply 1 application topically 2 (two) times daily. 80 g Valinda Hoar, NP      PDMP not reviewed this encounter.   Valinda Hoar, NP 05/11/21 1014

## 2021-06-09 ENCOUNTER — Telehealth: Payer: Self-pay | Admitting: Internal Medicine

## 2021-06-10 NOTE — Telephone Encounter (Signed)
If pt has never been seen at the office and Dr. Pecola Leisure is wanting pt to receive a new cpap machine and for pt to get it from our office, pt will need to be scheduled a consult appt with our office.  Attempted to call Dr. Pecola Leisure at [facility] but unable to reach. Left message for them to return call.  When call is returned, please make sure a referral is placed by Dr. Pecola Leisure for pt to be able to then get scheduled for a consult appt with our office.

## 2021-10-10 ENCOUNTER — Ambulatory Visit: Payer: Medicaid Other

## 2021-10-11 ENCOUNTER — Ambulatory Visit (HOSPITAL_COMMUNITY): Payer: Medicaid Other

## 2022-03-08 ENCOUNTER — Ambulatory Visit
Admission: RE | Admit: 2022-03-08 | Discharge: 2022-03-08 | Disposition: A | Discharge status: Home / Self Care | Payer: 59 | Source: Ambulatory Visit | Attending: Internal Medicine | Admitting: Internal Medicine

## 2022-03-08 VITALS — BP 113/79 | HR 87 | Temp 97.7°F | Resp 18

## 2022-03-08 DIAGNOSIS — J069 Acute upper respiratory infection, unspecified: Secondary | ICD-10-CM

## 2022-03-08 MED ORDER — FLUTICASONE PROPIONATE 50 MCG/ACT NA SUSP: 1.0000 | Freq: Every day | NASAL | 0 refills | Status: DC

## 2022-03-08 MED ORDER — BENZONATATE 100 MG PO CAPS: 100.0000 mg | ORAL_CAPSULE | Freq: Three times a day (TID) | ORAL | 0 refills | Status: DC | PRN

## 2022-03-08 NOTE — Discharge Instructions (Addendum)
It appears that you have a viral upper respiratory infection that should run its course and self resolve in the next 2 days with symptomatic treatment.  Two medications are prescribed to help alleviate symptoms.  Please follow-up if symptoms persist or worsen.

## 2022-03-08 NOTE — ED Triage Notes (Signed)
Patient presents to Urgent Care with complaints of cough, sinus pressure, fatigue since Friday. Patient reports Claritin and day quill otc.

## 2022-03-08 NOTE — ED Provider Notes (Signed)
[name]    CSN: [id number] Arrival date & time: 03/08/22  1401      History   Chief Complaint Chief Complaint  Patient presents with   Cough    Entered by patient    HPI Sandra Wong is a 36 y.o. female.   Patient presents with cough, sinus pressure, nasal congestion, fatigue that has been present for approximately 4 days.  Denies any known sick contacts.  Denies any known fevers.  Patient has taken Claritin and DayQuil with minimal improvement in symptoms.  Denies chest pain, shortness of breath, sore throat, ear pain, nausea, vomiting, diarrhea, abdominal pain.  Patient denies history of asthma or COPD.  Patient does smoke approximately 3 cigarettes a day.   Cough   Past Medical History:  Diagnosis Date   Abortion    Anemia 2009   s/p delivery   Anxiety    Chlamydia    Chronic back pain    Depression    Gonorrhea    Headache(784.0)    no treatment occasional headache   Obesity    Sleep apnea    per patient, no study    Patient Active Problem List   Diagnosis Date Noted   OSA (obstructive sleep apnea) 09/28/2019   Morbid obesity (HCC) 04/29/2015   Anxiety 03/28/2015   Depression 03/28/2015   Migraine with aura 06/18/2013    Past Surgical History:  Procedure Laterality Date   DILATION AND CURETTAGE OF UTERUS     INDUCED ABORTION     LAPAROSCOPIC TUBAL LIGATION Bilateral 01/15/2016   Procedure: LAPAROSCOPIC BILATERAL TUBAL LIGATION;  Surgeon: Tereso Newcomer, MD;  Location: [facility];  Service: Gynecology;  Laterality: Bilateral;    OB History     Gravida  4   Para  3   Term  2   Preterm  1   AB  1   Living  3      SAB  0   IAB  0   Ectopic  0   Multiple  0   Live Births  3        Obstetric Comments  2015 - attempted magnesium, SROM, rapid delivery, postpartum hemorrhage - cytotec          Home Medications    Prior to Admission medications   Medication Sig Start Date End Date Taking? Authorizing  Provider  benzonatate (TESSALON) 100 MG capsule Take 1 capsule (100 mg total) by mouth every 8 (eight) hours as needed for cough. 03/08/22  Yes Issam Carlyon, Acie Fredrickson, FNP  fluticasone (FLONASE) 50 MCG/ACT nasal spray Place 1 spray into both nostrils daily for 3 days. 03/08/22 03/11/22 Yes Petrita Blunck, Acie Fredrickson, FNP  cetirizine (ZYRTEC) 10 MG tablet Take 10 mg by mouth.    [provider]  hydrochlorothiazide (HYDRODIURIL) 25 MG tablet Take 25 mg by mouth daily.    [provider]  metroNIDAZOLE (FLAGYL) 500 MG tablet Take 1 tablet (500 mg total) by mouth 2 (two) times daily. 03/23/21   Merrilee Jansky, MD  Multiple Vitamin (MULTIVITAMIN WITH MINERALS) TABS tablet Take 1 tablet by mouth daily.    [provider]  predniSONE (STERAPRED UNI-PAK 21 TAB) 10 MG (21) TBPK tablet Take by mouth daily. Take 6 tabs by mouth daily  for 2 days, then 5 tabs for 2 days, then 4 tabs for 2 days, then 3 tabs for 2 days, 2 tabs for 2 days, then 1 tab by mouth daily for 2 days 05/11/21  White, Adrienne R, NP  triamcinolone cream (KENALOG) 0.1 % Apply 1 application topically 2 (two) times daily. 05/11/21   Valinda Hoar, NP    Family History Family History  Problem Relation Age of Onset   Hypertension Mother    Hypertension Paternal Aunt    Cancer Maternal Grandfather    Stroke Maternal Grandfather    Hypertension Father    Asthma Son    Kidney disease Paternal Grandmother    Kidney disease Paternal Grandfather     Social History Social History   Tobacco Use   Smoking status: Former    Packs/day: 0.25    Years: 3.00    Total pack years: 0.75    Types: Cigarettes    Quit date: 06/08/2013    Years since quitting: 8.7   Smokeless tobacco: Never  Vaping Use   Vaping Use: Never used  Substance Use Topics   Alcohol use: Not Currently    Comment: occasional when not pregnant    Drug use: No     Allergies   Other   Review of Systems Review of Systems Per HPI  Physical  Exam Triage Vital Signs ED Triage Vitals  Enc Vitals Group     BP 03/08/22 1418 113/79     Pulse Rate 03/08/22 1418 87     Resp 03/08/22 1418 18     Temp 03/08/22 1418 97.7 F (36.5 C)     Temp Source 03/08/22 1418 Oral     SpO2 03/08/22 1418 93 %     Weight --      Height --      Head Circumference --      Peak Flow --      Pain Score 03/08/22 1417 3     Pain Loc --      Pain Edu? --      Excl. in GC? --    No data found.  Updated Vital Signs BP 113/79   Pulse 87   Temp 97.7 F (36.5 C) (Oral)   Resp 18   LMP 03/02/2022 (Exact Date)   SpO2 97%   Visual Acuity Right Eye Distance:   Left Eye Distance:   Bilateral Distance:    Right Eye Near:   Left Eye Near:    Bilateral Near:     Physical Exam Constitutional:      General: She is not in acute distress.    Appearance: Normal appearance. She is not toxic-appearing or diaphoretic.  HENT:     Head: Normocephalic and atraumatic.     Right Ear: Tympanic membrane and ear canal normal.     Left Ear: Tympanic membrane and ear canal normal.     Nose: Congestion present.     Mouth/Throat:     Mouth: Mucous membranes are moist.     Pharynx: No posterior oropharyngeal erythema.  Eyes:     Extraocular Movements: Extraocular movements intact.     Conjunctiva/sclera: Conjunctivae normal.     Pupils: Pupils are equal, round, and reactive to light.  Cardiovascular:     Rate and Rhythm: Normal rate and regular rhythm.     Pulses: Normal pulses.     Heart sounds: Normal heart sounds.  Pulmonary:     Effort: Pulmonary effort is normal. No respiratory distress.     Breath sounds: Normal breath sounds. No stridor. No wheezing, rhonchi or rales.  Abdominal:     General: Abdomen is flat. Bowel sounds are normal.     Palpations: Abdomen is  soft.  Musculoskeletal:        General: Normal range of motion.     Cervical back: Normal range of motion.  Skin:    General: Skin is warm and dry.  Neurological:     General: No focal  deficit present.     Mental Status: She is alert and oriented to person, place, and time. Mental status is at baseline.  Psychiatric:        Mood and Affect: Mood normal.        Behavior: Behavior normal.      UC Treatments / Results  Labs (all labs ordered are listed, but only abnormal results are displayed) Labs Reviewed  NOVEL CORONAVIRUS, NAA    EKG   Radiology No results found.  Procedures Procedures (including critical care time)  Medications Ordered in UC Medications - No data to display  Initial Impression / Assessment and Plan / UC Course  I have reviewed the triage vital signs and the nursing notes.  Pertinent labs & imaging results that were available during my care of the patient were reviewed by me and considered in my medical decision making (see chart for details).     Patient presents with symptoms likely from a viral upper respiratory infection. Differential includes bacterial pneumonia, sinusitis, allergic rhinitis, COVID-19, flu. Do not suspect underlying cardiopulmonary process. Symptoms seem unlikely related to ACS, CHF or COPD exacerbations, pneumonia, pneumothorax. Patient is nontoxic appearing and not in need of emergent medical intervention.  COVID test pending.  Recommended symptom control and supportive care. patient sent prescriptions.  Return if symptoms fail to improve in 1-2 weeks or you develop shortness of breath, chest pain, severe headache. Patient states understanding and is agreeable.  Discharged with PCP followup.  Final Clinical Impressions(s) / UC Diagnoses   Final diagnoses:  Viral upper respiratory tract infection with cough     Discharge Instructions      It appears that you have a viral upper respiratory infection that should run its course and self resolve in the next 2 days with symptomatic treatment.  Two medications are prescribed to help alleviate symptoms.  Please follow-up if symptoms persist or worsen.    ED  Prescriptions     Medication Sig Dispense Auth. Provider   fluticasone (FLONASE) 50 MCG/ACT nasal spray Place 1 spray into both nostrils daily for 3 days. 16 g Jailynne Opperman, Rolly SalterHaley E, [place]   benzonatate (TESSALON) 100 MG capsule Take 1 capsule (100 mg total) by mouth every 8 (eight) hours as needed for cough. 21 capsule [place], Acie FredricksonHaley E, [place]NP      PDMP not reviewed this encounter.   Gustavus BryantMound, Aliegha Paullin E, [place] 03/08/22 1434

## 2022-03-09 LAB — NOVEL CORONAVIRUS, NAA: SARS-CoV-2, NAA: NOT DETECTED

## 2022-04-11 ENCOUNTER — Ambulatory Visit
Admission: RE | Admit: 2022-04-11 | Discharge: 2022-04-11 | Disposition: A | Discharge status: Home / Self Care | Payer: 59 | Source: Ambulatory Visit | Attending: Physician Assistant | Admitting: Physician Assistant

## 2022-04-11 VITALS — BP 116/77 | HR 86 | Temp 98.1°F | Resp 18

## 2022-04-11 DIAGNOSIS — N760 Acute vaginitis: Secondary | ICD-10-CM | POA: Diagnosis present

## 2022-04-11 MED ORDER — FLUCONAZOLE 150 MG PO TABS: 150.0000 mg | ORAL_TABLET | ORAL | 0 refills | Status: DC | PRN

## 2022-04-11 NOTE — ED Triage Notes (Signed)
Pt is present today with vaginal irritation. Pt sx started tuesday

## 2022-04-11 NOTE — ED Provider Notes (Signed)
[name]    CSN: [id number] Arrival date & time: 04/11/22  0854      History   Chief Complaint Chief Complaint  Patient presents with   Vaginal Itching    clit is burning/itchy and inflamed - Entered by patient    HPI Sandra Wong is a 36 y.o. female.   Patient presents today with a several day history of vaginal irritation particularly of her clitoris.  She reports itching but has not noticed any discharge.  She did have some urinary symptoms and so took Azo vaginal health which provided some improvement of symptoms.  She denies any changes to her personal hygiene products including soaps or detergents.  Denies any recent antibiotic use.  Denies any recent medication changes.  She does report that just prior to symptom onset she was very sweaty and wonders if this could have caused symptoms.  Denies history of diabetes and does not take SGLT2 inhibitor.  She is sexually active in a monogamous marriage does not use condoms.  She does not believe she could be pregnant.  No additional symptoms including abdominal pain, fever, nausea, vomiting.    Past Medical History:  Diagnosis Date   Abortion    Anemia 2009   s/p delivery   Anxiety    Chlamydia    Chronic back pain    Depression    Gonorrhea    Headache(784.0)    no treatment occasional headache   Obesity    Sleep apnea    per patient, no study    Patient Active Problem List   Diagnosis Date Noted   OSA (obstructive sleep apnea) 09/28/2019   Morbid obesity (HCC) 04/29/2015   Anxiety 03/28/2015   Depression 03/28/2015   Migraine with aura 06/18/2013    Past Surgical History:  Procedure Laterality Date   DILATION AND CURETTAGE OF UTERUS     INDUCED ABORTION     LAPAROSCOPIC TUBAL LIGATION Bilateral 01/15/2016   Procedure: LAPAROSCOPIC BILATERAL TUBAL LIGATION;  Surgeon: Tereso Newcomer, MD;  Location: [facility];  Service: Gynecology;  Laterality: Bilateral;    OB History     Gravida  4    Para  3   Term  2   Preterm  1   AB  1   Living  3      SAB  0   IAB  0   Ectopic  0   Multiple  0   Live Births  3        Obstetric Comments  2015 - attempted magnesium, SROM, rapid delivery, postpartum hemorrhage - cytotec          Home Medications    Prior to Admission medications   Medication Sig Start Date End Date Taking? Authorizing Provider  fluconazole (DIFLUCAN) 150 MG tablet Take 1 tablet (150 mg total) by mouth every 3 (three) days as needed. 04/11/22  Yes Lanyiah Brix K, PA-C  benzonatate (TESSALON) 100 MG capsule Take 1 capsule (100 mg total) by mouth every 8 (eight) hours as needed for cough. 03/08/22   Gustavus Bryant, FNP  cetirizine (ZYRTEC) 10 MG tablet Take 10 mg by mouth.    [provider]  fluticasone (FLONASE) 50 MCG/ACT nasal spray Place 1 spray into both nostrils daily for 3 days. 03/08/22 03/11/22  Gustavus Bryant, FNP  hydrochlorothiazide (HYDRODIURIL) 25 MG tablet Take 25 mg by mouth daily.    [provider]  metroNIDAZOLE (FLAGYL) 500 MG tablet Take 1 tablet (500 mg  total) by mouth 2 (two) times daily. 03/23/21   Merrilee Jansky, MD  Multiple Vitamin (MULTIVITAMIN WITH MINERALS) TABS tablet Take 1 tablet by mouth daily.    [provider]  predniSONE (STERAPRED UNI-PAK 21 TAB) 10 MG (21) TBPK tablet Take by mouth daily. Take 6 tabs by mouth daily  for 2 days, then 5 tabs for 2 days, then 4 tabs for 2 days, then 3 tabs for 2 days, 2 tabs for 2 days, then 1 tab by mouth daily for 2 days 05/11/21   Valinda Hoar, NP  triamcinolone cream (KENALOG) 0.1 % Apply 1 application topically 2 (two) times daily. 05/11/21   Valinda Hoar, NP    Family History Family History  Problem Relation Age of Onset   Hypertension Mother    Hypertension Paternal Aunt    Cancer Maternal Grandfather    Stroke Maternal Grandfather    Hypertension Father    Asthma Son    Kidney disease Paternal Grandmother    Kidney disease  Paternal Grandfather     Social History Social History   Tobacco Use   Smoking status: Former    Packs/day: 0.25    Years: 3.00    Total pack years: 0.75    Types: Cigarettes    Quit date: 06/08/2013    Years since quitting: 8.8   Smokeless tobacco: Never  Vaping Use   Vaping Use: Never used  Substance Use Topics   Alcohol use: Not Currently    Comment: occasional when not pregnant    Drug use: No     Allergies   Other   Review of Systems Review of Systems  Constitutional:  Positive for activity change. Negative for appetite change, fatigue and fever.  Respiratory:  Negative for cough and shortness of breath.   Cardiovascular:  Negative for chest pain.  Gastrointestinal:  Negative for abdominal pain, diarrhea, nausea and vomiting.  Genitourinary:  Positive for vaginal pain. Negative for dysuria, frequency, pelvic pain, urgency, vaginal bleeding and vaginal discharge.     Physical Exam Triage Vital Signs ED Triage Vitals  Enc Vitals Group     BP 04/11/22 0910 116/77     Pulse Rate 04/11/22 0910 86     Resp 04/11/22 0910 18     Temp 04/11/22 0910 98.1 F (36.7 C)     Temp src --      SpO2 04/11/22 0910 98 %     Weight --      Height --      Head Circumference --      Peak Flow --      Pain Score 04/11/22 0909 0     Pain Loc --      Pain Edu? --      Excl. in GC? --    No data found.  Updated Vital Signs BP 116/77   Pulse 86   Temp 98.1 F (36.7 C)   Resp 18   SpO2 98%   Visual Acuity Right Eye Distance:   Left Eye Distance:   Bilateral Distance:    Right Eye Near:   Left Eye Near:    Bilateral Near:     Physical Exam Vitals reviewed.  Constitutional:      General: She is awake. She is not in acute distress.    Appearance: Normal appearance. She is well-developed. She is not ill-appearing.     Comments: Very pleasant female appears stated age in no acute distress sitting comfortably in exam room  HENT:     Head: Normocephalic and  atraumatic.  Cardiovascular:     Rate and Rhythm: Normal rate and regular rhythm.     Heart sounds: Normal heart sounds, S1 normal and S2 normal. No murmur heard. Pulmonary:     Effort: Pulmonary effort is normal.     Breath sounds: Normal breath sounds. No wheezing, rhonchi or rales.     Comments: Clear to auscultation bilaterally Abdominal:     Palpations: Abdomen is soft.     Tenderness: There is no abdominal tenderness.  Genitourinary:    Labia:        Right: No rash or tenderness.        Left: No rash or tenderness.      Comments: No significant discharge or swelling. Psychiatric:        Behavior: Behavior is cooperative.      UC Treatments / Results  Labs (all labs ordered are listed, but only abnormal results are displayed) Labs Reviewed  CERVICOVAGINAL ANCILLARY ONLY    EKG   Radiology No results found.  Procedures Procedures (including critical care time)  Medications Ordered in UC Medications - No data to display  Initial Impression / Assessment and Plan / UC Course  I have reviewed the triage vital signs and the nursing notes.  Pertinent labs & imaging results that were available during my care of the patient were reviewed by me and considered in my medical decision making (see chart for details).     Concern for yeast infection given clinical presentation.  STI swab was collected today-results pending.  We will treat with Diflucan 1 dose every 3 days for 2 doses.  If we need to arrange additional treatment based on her swab results we will contact her.  Recommended she use hypoallergenic soaps and detergents and wear loosefitting cotton underwear.  Discussed that she should keep the area clean and avoid prolonged exposure to moisture.  Discussed that if she has any worsening symptoms she needs to be reevaluated.  Strict return precautions given.  Work excuse note provided.  Final Clinical Impressions(s) / UC Diagnoses   Final diagnoses:  Vaginitis and  vulvovaginitis     Discharge Instructions      I think your symptoms are related to yeast infection.  Take Diflucan as prescribed.  Wear loosefitting cotton underwear and use hypoallergenic soaps and detergents.  If your symptoms or not improving please follow-up with OB/GYN.  If anything worsens return for reevaluation.     ED Prescriptions     Medication Sig Dispense Auth. Provider   fluconazole (DIFLUCAN) 150 MG tablet Take 1 tablet (150 mg total) by mouth every 3 (three) days as needed. 2 tablet Leelah Hanna, Derry Skill, PA-C      PDMP not reviewed this encounter.   Terrilee Croak, PA-C 04/11/22 [id number]

## 2022-04-11 NOTE — Discharge Instructions (Signed)
I think your symptoms are related to yeast infection.  Take Diflucan as prescribed.  Wear loosefitting cotton underwear and use hypoallergenic soaps and detergents.  If your symptoms or not improving please follow-up with OB/GYN.  If anything worsens return for reevaluation.

## 2022-04-12 LAB — CERVICOVAGINAL ANCILLARY ONLY
Bacterial Vaginitis (gardnerella): POSITIVE — AB
Candida Glabrata: NEGATIVE
Candida Vaginitis: NEGATIVE
Chlamydia: NEGATIVE
Comment: NEGATIVE
Comment: NEGATIVE
Comment: NEGATIVE
Comment: NEGATIVE
Comment: NEGATIVE
Comment: NORMAL
Neisseria Gonorrhea: NEGATIVE
Trichomonas: NEGATIVE

## 2022-04-13 ENCOUNTER — Telehealth (HOSPITAL_COMMUNITY): Payer: Self-pay | Admitting: Emergency Medicine

## 2022-04-13 MED ORDER — METRONIDAZOLE 500 MG PO TABS: 500.0000 mg | ORAL_TABLET | Freq: Two times a day (BID) | ORAL | 0 refills | Status: DC

## 2022-06-11 ENCOUNTER — Ambulatory Visit
Admission: EM | Admit: 2022-06-11 | Discharge: 2022-06-11 | Disposition: A | Discharge status: Home / Self Care | Payer: Medicaid Other | Attending: Physician Assistant | Admitting: Physician Assistant

## 2022-06-11 ENCOUNTER — Encounter: Payer: Self-pay | Admitting: Physician Assistant

## 2022-06-11 DIAGNOSIS — Z1152 Encounter for screening for COVID-19: Secondary | ICD-10-CM

## 2022-06-11 DIAGNOSIS — J069 Acute upper respiratory infection, unspecified: Secondary | ICD-10-CM | POA: Diagnosis not present

## 2022-06-11 DIAGNOSIS — J029 Acute pharyngitis, unspecified: Secondary | ICD-10-CM | POA: Diagnosis not present

## 2022-06-11 LAB — RESP PANEL BY RT-PCR (FLU A&B, COVID) ARPGX2
Influenza A by PCR: NEGATIVE
Influenza B by PCR: NEGATIVE
SARS Coronavirus 2 by RT PCR: NEGATIVE

## 2022-06-11 LAB — POCT RAPID STREP A (OFFICE): Rapid Strep A Screen: NEGATIVE

## 2022-06-11 MED ORDER — IBUPROFEN 600 MG PO TABS: 600.0000 mg | ORAL_TABLET | Freq: Four times a day (QID) | ORAL | 0 refills | Status: DC | PRN

## 2022-06-11 NOTE — Discharge Instructions (Signed)
  Try CEPACOL lozenges for sore throat relief. 

## 2022-06-11 NOTE — ED Triage Notes (Signed)
Pt presents to uc with co of sore throat, ha, and congestion. Pt reports mild abd pain from post nasel drip and loose stool. Pt reports she has taken tylenol for symptoms.

## 2022-06-11 NOTE — ED Provider Notes (Signed)
[name]    CSN: [id number] Arrival date & time: 06/11/22  0856      History   Chief Complaint Chief Complaint  Patient presents with   Sore Throat    HPI Sandra Wong is a 36 y.o. female.   Patient here today for evaluation of sore throat, headache and congestion that started 2 days ago.  She reports she has had some loose stools but denies any nausea or vomiting.  She has not had known fever.  She has been taking OTC meds with mild improvement.  The history is provided by the patient.    Past Medical History:  Diagnosis Date   Abortion    Anemia 2009   s/p delivery   Anxiety    Chlamydia    Chronic back pain    Depression    Gonorrhea    Headache(784.0)    no treatment occasional headache   Obesity    Sleep apnea    per patient, no study    Patient Active Problem List   Diagnosis Date Noted   OSA (obstructive sleep apnea) 09/28/2019   Morbid obesity (HCC) 04/29/2015   Anxiety 03/28/2015   Depression 03/28/2015   Migraine with aura 06/18/2013    Past Surgical History:  Procedure Laterality Date   DILATION AND CURETTAGE OF UTERUS     INDUCED ABORTION     LAPAROSCOPIC TUBAL LIGATION Bilateral 01/15/2016   Procedure: LAPAROSCOPIC BILATERAL TUBAL LIGATION;  Surgeon: Tereso Newcomer, MD;  Location: [facility];  Service: Gynecology;  Laterality: Bilateral;    OB History     Gravida  4   Para  3   Term  2   Preterm  1   AB  1   Living  3      SAB  0   IAB  0   Ectopic  0   Multiple  0   Live Births  3        Obstetric Comments  2015 - attempted magnesium, SROM, rapid delivery, postpartum hemorrhage - cytotec          Home Medications    Prior to Admission medications   Medication Sig Start Date End Date Taking? Authorizing Provider  ibuprofen (ADVIL) 600 MG tablet Take 1 tablet (600 mg total) by mouth every 6 (six) hours as needed. 06/11/22  Yes Tomi Bamberger, PA-C  benzonatate (TESSALON) 100 MG capsule  Take 1 capsule (100 mg total) by mouth every 8 (eight) hours as needed for cough. 03/08/22   Gustavus Bryant, FNP  cetirizine (ZYRTEC) 10 MG tablet Take 10 mg by mouth.    [provider]  fluconazole (DIFLUCAN) 150 MG tablet Take 1 tablet (150 mg total) by mouth every 3 (three) days as needed. 04/11/22   Raspet, Erin K, PA-C  fluticasone (FLONASE) 50 MCG/ACT nasal spray Place 1 spray into both nostrils daily for 3 days. 03/08/22 03/11/22  Gustavus Bryant, FNP  hydrochlorothiazide (HYDRODIURIL) 25 MG tablet Take 25 mg by mouth daily.    [provider]  metroNIDAZOLE (FLAGYL) 500 MG tablet Take 1 tablet (500 mg total) by mouth 2 (two) times daily. 04/13/22   LampteyBritta Mccreedy, MD  Multiple Vitamin (MULTIVITAMIN WITH MINERALS) TABS tablet Take 1 tablet by mouth daily.    [provider]  predniSONE (STERAPRED UNI-PAK 21 TAB) 10 MG (21) TBPK tablet Take by mouth daily. Take 6 tabs by mouth daily  for 2 days, then 5 tabs for 2  days, then 4 tabs for 2 days, then 3 tabs for 2 days, 2 tabs for 2 days, then 1 tab by mouth daily for 2 days 05/11/21   Valinda Hoar, NP  triamcinolone cream (KENALOG) 0.1 % Apply 1 application topically 2 (two) times daily. 05/11/21   Valinda Hoar, NP    Family History Family History  Problem Relation Age of Onset   Hypertension Mother    Hypertension Paternal Aunt    Cancer Maternal Grandfather    Stroke Maternal Grandfather    Hypertension Father    Asthma Son    Kidney disease Paternal Grandmother    Kidney disease Paternal Grandfather     Social History Social History   Tobacco Use   Smoking status: Former    Packs/day: 0.25    Years: 3.00    Total pack years: 0.75    Types: Cigarettes    Quit date: 06/08/2013    Years since quitting: 9.0   Smokeless tobacco: Never  Vaping Use   Vaping Use: Never used  Substance Use Topics   Alcohol use: Not Currently    Comment: occasional when not pregnant    Drug use: No      Allergies   Other   Review of Systems Review of Systems  Constitutional:  Negative for chills and fever.  HENT:  Positive for congestion and sore throat. Negative for ear pain.   Eyes:  Negative for discharge and redness.  Respiratory:  Positive for cough. Negative for shortness of breath and wheezing.   Gastrointestinal:  Positive for diarrhea. Negative for nausea and vomiting.     Physical Exam Triage Vital Signs ED Triage Vitals  Enc Vitals Group     BP      Pulse      Resp      Temp      Temp src      SpO2      Weight      Height      Head Circumference      Peak Flow      Pain Score      Pain Loc      Pain Edu?      Excl. in GC?    No data found.  Updated Vital Signs BP 100/72   Pulse 89   Temp (!) 97.5 F (36.4 C)   Resp 20   SpO2 96%   Physical Exam Vitals and nursing note reviewed.  Constitutional:      General: She is not in acute distress.    Appearance: Normal appearance. She is not ill-appearing.  HENT:     Head: Normocephalic and atraumatic.     Nose: Congestion present.     Mouth/Throat:     Mouth: Mucous membranes are moist.     Pharynx: Posterior oropharyngeal erythema present. No oropharyngeal exudate.  Eyes:     Conjunctiva/sclera: Conjunctivae normal.  Cardiovascular:     Rate and Rhythm: Normal rate and regular rhythm.     Heart sounds: Normal heart sounds. No murmur heard. Pulmonary:     Effort: Pulmonary effort is normal. No respiratory distress.     Breath sounds: Normal breath sounds. No wheezing, rhonchi or rales.  Skin:    General: Skin is warm and dry.  Neurological:     Mental Status: She is alert.  Psychiatric:        Mood and Affect: Mood normal.        Thought Content: Thought content  normal.      UC Treatments / Results  Labs (all labs ordered are listed, but only abnormal results are displayed) Labs Reviewed  RESP PANEL BY RT-PCR (FLU A&B, COVID) ARPGX2  CULTURE, GROUP A STREP [facility])  POCT RAPID  STREP A (OFFICE)    EKG   Radiology No results found.  Procedures Procedures (including critical care time)  Medications Ordered in UC Medications - No data to display  Initial Impression / Assessment and Plan / UC Course  I have reviewed the triage vital signs and the nursing notes.  Pertinent labs & imaging results that were available during my care of the patient were reviewed by me and considered in my medical decision making (see chart for details).    Strep test negative in office.  Will order throat culture as well as screening for COVID and flu.  Ibuprofen prescribed for pain and discussed Cepacol lozenges for sore throat relief.  Encouraged follow-up with any further concerns.  Final Clinical Impressions(s) / UC Diagnoses   Final diagnoses:  Acute upper respiratory infection  Encounter for screening for COVID-19  Acute pharyngitis, unspecified etiology     Discharge Instructions       Try CEPACOL lozenges for sore throat relief.      ED Prescriptions     Medication Sig Dispense Auth. Provider   ibuprofen (ADVIL) 600 MG tablet Take 1 tablet (600 mg total) by mouth every 6 (six) hours as needed. 30 tablet Tomi Bamberger, PA-C      PDMP not reviewed this encounter.   Tomi Bamberger, PA-C 06/11/22 1029

## 2022-06-14 ENCOUNTER — Telehealth (HOSPITAL_COMMUNITY): Payer: Self-pay | Admitting: Emergency Medicine

## 2022-06-14 LAB — CULTURE, GROUP A STREP (THRC)

## 2022-06-14 MED ORDER — AZITHROMYCIN 250 MG PO TABS: 250.0000 mg | ORAL_TABLET | Freq: Every day | ORAL | 0 refills | Status: DC

## 2022-09-15 ENCOUNTER — Ambulatory Visit
Admission: EM | Admit: 2022-09-15 | Discharge: 2022-09-15 | Disposition: A | Discharge status: Home / Self Care | Payer: Medicaid Other | Attending: Internal Medicine | Admitting: Internal Medicine

## 2022-09-15 DIAGNOSIS — H65192 Other acute nonsuppurative otitis media, left ear: Secondary | ICD-10-CM

## 2022-09-15 HISTORY — DX: Essential (primary) hypertension: I10

## 2022-09-15 MED ORDER — AMOXICILLIN 875 MG PO TABS: 875.0000 mg | ORAL_TABLET | Freq: Two times a day (BID) | ORAL | 0 refills | Status: AC

## 2022-09-15 NOTE — ED Triage Notes (Signed)
Pt c/o left ear ache onset ~ 1 week ago. States sounds "clogged" and has popping when blowing nose.

## 2022-09-15 NOTE — Discharge Instructions (Signed)
You have an ear infection which is being treated with an antibiotic.  Please follow-up if symptoms persist or worsen. 

## 2022-09-15 NOTE — ED Provider Notes (Signed)
[name]    CSN: [id number] Arrival date & time: 09/15/22  1805      History   Chief Complaint Chief Complaint  Patient presents with   left ear popping    HPI Sandra Wong is a 36 y.o. female.   Patient presents with left ear pain and discomfort that started about a week ago.  She denies any associated upper respiratory symptoms, cough, fever.  Denies trauma, foreign body, drainage from the ear.  Patient has taken ibuprofen with minimal improvement in symptoms.     Past Medical History:  Diagnosis Date   Abortion    Anemia 2009   s/p delivery   Anxiety    Chlamydia    Chronic back pain    Depression    Gonorrhea    Headache(784.0)    no treatment occasional headache   Hypertension    Obesity    Sleep apnea    per patient, no study    Patient Active Problem List   Diagnosis Date Noted   OSA (obstructive sleep apnea) 09/28/2019   Morbid obesity (HCC) 04/29/2015   Anxiety 03/28/2015   Depression 03/28/2015   Migraine with aura 06/18/2013    Past Surgical History:  Procedure Laterality Date   DILATION AND CURETTAGE OF UTERUS     INDUCED ABORTION     LAPAROSCOPIC TUBAL LIGATION Bilateral 01/15/2016   Procedure: LAPAROSCOPIC BILATERAL TUBAL LIGATION;  Surgeon: Tereso Newcomer, MD;  Location: [facility];  Service: Gynecology;  Laterality: Bilateral;    OB History     Gravida  4   Para  3   Term  2   Preterm  1   AB  1   Living  3      SAB  0   IAB  0   Ectopic  0   Multiple  0   Live Births  3        Obstetric Comments  2015 - attempted magnesium, SROM, rapid delivery, postpartum hemorrhage - cytotec          Home Medications    Prior to Admission medications   Medication Sig Start Date End Date Taking? Authorizing Provider  amoxicillin (AMOXIL) 875 MG tablet Take 1 tablet (875 mg total) by mouth 2 (two) times daily for 10 days. 09/15/22 09/25/22 Yes Ezmeralda Stefanick, Acie Fredrickson, FNP  azithromycin (ZITHROMAX) 250 MG  tablet Take 1 tablet (250 mg total) by mouth daily. Take first 2 tablets together, then 1 every day until finished. 06/14/22   Lamptey, Britta Mccreedy, MD  benzonatate (TESSALON) 100 MG capsule Take 1 capsule (100 mg total) by mouth every 8 (eight) hours as needed for cough. 03/08/22   Gustavus Bryant, FNP  cetirizine (ZYRTEC) 10 MG tablet Take 10 mg by mouth.    [provider]  fluconazole (DIFLUCAN) 150 MG tablet Take 1 tablet (150 mg total) by mouth every 3 (three) days as needed. 04/11/22   Raspet, Erin K, PA-C  fluticasone (FLONASE) 50 MCG/ACT nasal spray Place 1 spray into both nostrils daily for 3 days. 03/08/22 03/11/22  Gustavus Bryant, FNP  hydrochlorothiazide (HYDRODIURIL) 25 MG tablet Take 25 mg by mouth daily.    [provider]  ibuprofen (ADVIL) 600 MG tablet Take 1 tablet (600 mg total) by mouth every 6 (six) hours as needed. 06/11/22   Tomi Bamberger, PA-C  metroNIDAZOLE (FLAGYL) 500 MG tablet Take 1 tablet (500 mg total) by mouth 2 (two) times daily. 04/13/22  Merrilee Jansky, MD  Multiple Vitamin (MULTIVITAMIN WITH MINERALS) TABS tablet Take 1 tablet by mouth daily.    [provider]  predniSONE (STERAPRED UNI-PAK 21 TAB) 10 MG (21) TBPK tablet Take by mouth daily. Take 6 tabs by mouth daily  for 2 days, then 5 tabs for 2 days, then 4 tabs for 2 days, then 3 tabs for 2 days, 2 tabs for 2 days, then 1 tab by mouth daily for 2 days 05/11/21   Valinda Hoar, NP  triamcinolone cream (KENALOG) 0.1 % Apply 1 application topically 2 (two) times daily. 05/11/21   Valinda Hoar, NP    Family History Family History  Problem Relation Age of Onset   Hypertension Mother    Hypertension Paternal Aunt    Cancer Maternal Grandfather    Stroke Maternal Grandfather    Hypertension Father    Asthma Son    Kidney disease Paternal Grandmother    Kidney disease Paternal Grandfather     Social History Social History   Tobacco Use   Smoking status: Former     Packs/day: 0.25    Years: 3.00    Total pack years: 0.75    Types: Cigarettes    Quit date: 06/08/2013    Years since quitting: 9.2   Smokeless tobacco: Never  Vaping Use   Vaping Use: Never used  Substance Use Topics   Alcohol use: Not Currently    Comment: occasional when not pregnant    Drug use: No     Allergies   Other   Review of Systems Review of Systems Per HPI  Physical Exam Triage Vital Signs ED Triage Vitals  Enc Vitals Group     BP 09/15/22 1937 116/82     Pulse Rate 09/15/22 1937 72     Resp 09/15/22 1937 20     Temp 09/15/22 1937 98.4 F (36.9 C)     Temp Source 09/15/22 1937 Oral     SpO2 09/15/22 1937 98 %     Weight --      Height --      Head Circumference --      Peak Flow --      Pain Score 09/15/22 1936 0     Pain Loc --      Pain Edu? --      Excl. in GC? --    No data found.  Updated Vital Signs BP 116/82 (BP Location: Left Arm)   Pulse 72   Temp 98.4 F (36.9 C) (Oral)   Resp 20   SpO2 98%   Visual Acuity Right Eye Distance:   Left Eye Distance:   Bilateral Distance:    Right Eye Near:   Left Eye Near:    Bilateral Near:     Physical Exam Constitutional:      General: She is not in acute distress.    Appearance: Normal appearance. She is not toxic-appearing or diaphoretic.  HENT:     Head: Normocephalic and atraumatic.     Right Ear: Tympanic membrane, ear canal and external ear normal.     Left Ear: Ear canal and external ear normal. Tympanic membrane is erythematous. Tympanic membrane is not perforated or bulging.  Eyes:     Extraocular Movements: Extraocular movements intact.     Conjunctiva/sclera: Conjunctivae normal.  Pulmonary:     Effort: Pulmonary effort is normal.  Neurological:     General: No focal deficit present.     Mental Status: She  is alert and oriented to person, place, and time. Mental status is at baseline.  Psychiatric:        Mood and Affect: Mood normal.        Behavior: Behavior normal.         Thought Content: Thought content normal.        Judgment: Judgment normal.      UC Treatments / Results  Labs (all labs ordered are listed, but only abnormal results are displayed) Labs Reviewed - No data to display  EKG   Radiology No results found.  Procedures Procedures (including critical care time)  Medications Ordered in UC Medications - No data to display  Initial Impression / Assessment and Plan / UC Course  I have reviewed the triage vital signs and the nursing notes.  Pertinent labs & imaging results that were available during my care of the patient were reviewed by me and considered in my medical decision making (see chart for details).     Patient has left otitis media.  Will treat with amoxicillin antibiotic.  Advised patient to follow-up if symptoms persist or worsen.  Patient verbalized understanding and was agreeable with plan. Final Clinical Impressions(s) / UC Diagnoses   Final diagnoses:  Other non-recurrent acute nonsuppurative otitis media of left ear     Discharge Instructions      You have an ear infection which is being treated with an antibiotic.  Please follow-up if symptoms persist or worsen.    ED Prescriptions     Medication Sig Dispense Auth. Provider   amoxicillin (AMOXIL) 875 MG tablet Take 1 tablet (875 mg total) by mouth 2 (two) times daily for 10 days. 20 tablet [place], Acie Fredrickson, [place]      PDMP not reviewed this encounter.   Gustavus Bryant, [place] 09/15/22 2047

## 2022-12-07 ENCOUNTER — Ambulatory Visit
Admission: EM | Admit: 2022-12-07 | Discharge: 2022-12-07 | Disposition: A | Discharge status: Home / Self Care | Payer: Medicaid Other | Attending: Urgent Care | Admitting: Urgent Care

## 2022-12-07 DIAGNOSIS — H6992 Unspecified Eustachian tube disorder, left ear: Secondary | ICD-10-CM | POA: Diagnosis not present

## 2022-12-07 DIAGNOSIS — J069 Acute upper respiratory infection, unspecified: Secondary | ICD-10-CM

## 2022-12-07 MED ORDER — FEXOFENADINE HCL 180 MG PO TABS: 180.0000 mg | ORAL_TABLET | Freq: Every day | ORAL | 0 refills | Status: DC

## 2022-12-07 MED ORDER — AMOXICILLIN-POT CLAVULANATE 875-125 MG PO TABS: 1.0000 | ORAL_TABLET | Freq: Two times a day (BID) | ORAL | 0 refills | Status: DC

## 2022-12-07 MED ORDER — FLUTICASONE PROPIONATE 50 MCG/ACT NA SUSP: 2.0000 | Freq: Every day | NASAL | 0 refills | Status: AC

## 2022-12-07 MED ORDER — PSEUDOEPHEDRINE HCL 60 MG PO TABS: 60.0000 mg | ORAL_TABLET | Freq: Three times a day (TID) | ORAL | 0 refills | Status: AC | PRN

## 2022-12-07 NOTE — ED Provider Notes (Signed)
[facility]  Note:  This document was prepared using Systems analyst and may include unintentional dictation errors.  MRN: [id number] DOB: 09/10/1986  Subjective:   Sandra Wong is a 37 y.o. female presenting for 57-monthhistory of persistent left ear fullness, buzzing, swishing sensation.  Was previously treated with an antibiotic and felt like it helped some but did not go away completely.  Has had sick symptoms for the 2 days including runny and stuffy nose, coughing.  No chest pain, shortness of breath or wheezing.  Has been taking Allegra.  Does not use a nasal spray.  No history of asthma.  No current facility-administered medications for this encounter.  Current Outpatient Medications:    fluticasone (FLONASE) 50 MCG/ACT nasal spray, Place 1 spray into both nostrils daily for 3 days., Disp: 16 g, Rfl: 0   Allergies  Allergen Reactions   Other     Has an allergy to some type of eye drop but does not remember the name (in childhood)    Past Medical History:  Diagnosis Date   Abortion    Anemia 2009   s/p delivery   Anxiety    Chlamydia    Chronic back pain    Depression    Gonorrhea    Headache(784.0)    no treatment occasional headache   Hypertension    Obesity    Sleep apnea    per patient, no study     Past Surgical History:  Procedure Laterality Date   DILATION AND CURETTAGE OF UTERUS     INDUCED ABORTION     LAPAROSCOPIC TUBAL LIGATION Bilateral 01/15/2016   Procedure: LAPAROSCOPIC BILATERAL TUBAL LIGATION;  Surgeon: UOsborne Oman MD;  [facility]: [facility];  Service: Gynecology;  Laterality: Bilateral;    Family History  Problem Relation Age of Onset   Hypertension Mother    Hypertension Paternal Aunt    Cancer Maternal Grandfather    Stroke Maternal Grandfather    Hypertension Father    Asthma Son    Kidney disease Paternal Grandmother    Kidney disease Paternal Grandfather     Social History    Tobacco Use   Smoking status: Former    Packs/day: 0.25    Years: 3.00    Total pack years: 0.75    Types: Cigarettes    Quit date: 06/08/2013    Years since quitting: 9.5   Smokeless tobacco: Never  Vaping Use   Vaping Use: Never used  Substance Use Topics   Alcohol use: Not Currently   Drug use: No    ROS   Objective:   Vitals: BP 113/78 (BP Location: Right Arm)   Pulse 83   Temp 98.6 F (37 C) (Oral)   Resp 18   LMP 11/26/2022   SpO2 97%   Physical Exam Constitutional:      General: She is not in acute distress.    Appearance: Normal appearance. She is well-developed and normal weight. She is not ill-appearing, toxic-appearing or diaphoretic.  HENT:     Head: Normocephalic and atraumatic.     Right Ear: Tympanic membrane, ear canal and external ear normal. No drainage or tenderness. No middle ear effusion. There is no impacted cerumen. Tympanic membrane is not erythematous or bulging.     Left Ear: Tympanic membrane, ear canal and external ear normal. No drainage or tenderness.  No middle ear effusion. There is no impacted cerumen. Tympanic membrane is not erythematous or bulging.  Nose: Congestion present. No rhinorrhea.     Mouth/Throat:     Mouth: Mucous membranes are moist. No oral lesions.     Pharynx: No pharyngeal swelling, oropharyngeal exudate, posterior oropharyngeal erythema or uvula swelling.     Tonsils: No tonsillar exudate or tonsillar abscesses.  Eyes:     General: No scleral icterus.       Right eye: No discharge.        Left eye: No discharge.     Extraocular Movements: Extraocular movements intact.     Right eye: Normal extraocular motion.     Left eye: Normal extraocular motion.     Conjunctiva/sclera: Conjunctivae normal.  Cardiovascular:     Rate and Rhythm: Normal rate and regular rhythm.     Heart sounds: Normal heart sounds. No murmur heard.    No friction rub. No gallop.  Pulmonary:     Effort: Pulmonary effort is normal. No  respiratory distress.     Breath sounds: No stridor. No wheezing, rhonchi or rales.  Chest:     Chest wall: No tenderness.  Musculoskeletal:     Cervical back: Normal range of motion and neck supple.  Lymphadenopathy:     Cervical: No cervical adenopathy.  Skin:    General: Skin is warm and dry.  Neurological:     General: No focal deficit present.     Mental Status: She is alert and oriented to person, place, and time.  Psychiatric:        Mood and Affect: Mood normal.        Behavior: Behavior normal.     Assessment and Plan :   PDMP not reviewed this encounter.  1. Eustachian tube dysfunction, left   2. Viral upper respiratory illness     No obvious signs of otitis media.  Unremarkable ENT exam.  Will use conservative management for what I suspect is eustachian tube dysfunction.  Recommended starting Flonase, Allegra, Sudafed.  I did offer a printed prescription for an antibiotic in the event that she has no improvement in the next 4 to 5 days, can fill the prescription to cover for secondary middle ear infection.  Counseled patient on potential for adverse effects with medications prescribed/recommended today, ER and return-to-clinic precautions discussed, patient verbalized understanding.    Sandra Wong, [place] 12/07/22 1658

## 2022-12-07 NOTE — ED Triage Notes (Signed)
Pt c/o "squishy sound" left ear since having ear infection in Dec 2023-c/o left ear pain and "closed up all the way" x 2 days-NAD-steady gait

## 2022-12-07 NOTE — Discharge Instructions (Addendum)
Start with Flonase and allegra daily long term. Use pseudoephedrine the rest of this week and then only as needed. If you Saturday and Sunday you continue to have ear issues/symptoms then go ahead and fill the printed prescription for Augmentin. This is an antibiotic that can address secondary ear and sinus infections.

## 2023-03-12 ENCOUNTER — Ambulatory Visit
Admission: RE | Admit: 2023-03-12 | Discharge: 2023-03-12 | Disposition: A | Discharge status: Home / Self Care | Payer: Medicaid Other | Source: Ambulatory Visit | Attending: Nurse Practitioner | Admitting: Nurse Practitioner

## 2023-03-12 VITALS — BP 115/78 | HR 88 | Temp 98.0°F | Resp 16

## 2023-03-12 DIAGNOSIS — N760 Acute vaginitis: Secondary | ICD-10-CM | POA: Diagnosis present

## 2023-03-12 DIAGNOSIS — R319 Hematuria, unspecified: Secondary | ICD-10-CM | POA: Diagnosis not present

## 2023-03-12 LAB — POCT URINALYSIS DIP (MANUAL ENTRY)
Bilirubin, UA: NEGATIVE
Glucose, UA: NEGATIVE mg/dL
Ketones, POC UA: NEGATIVE mg/dL
Leukocytes, UA: NEGATIVE
Nitrite, UA: NEGATIVE
Protein Ur, POC: >=300 mg/dL — AB
Spec Grav, UA: 1.025 (ref 1.010–1.025)
Urobilinogen, UA: 0.2 E.U./dL
pH, UA: 5.5 (ref 5.0–8.0)

## 2023-03-12 LAB — POCT URINE PREGNANCY: Preg Test, Ur: NEGATIVE

## 2023-03-12 MED ORDER — FLUCONAZOLE 150 MG PO TABS: 150.0000 mg | ORAL_TABLET | Freq: Every day | ORAL | 0 refills | Status: AC

## 2023-03-12 NOTE — ED Provider Notes (Signed)
[facility]    CSN: [id number] Arrival date & time: 03/12/23  0851      History   Chief Complaint Chief Complaint  Patient presents with   SEXUALLY TRANSMITTED DISEASE    I have mild vaginal irritation - Entered by patient    HPI Sandra Wong is a 37 y.o. female presents for evaluation of vaginal irritation.  Patient reports 2 days of a itchy/burning external feeling on her vagina.  States it began after she was in some wet shorts for a period of time.  Denies any discharge, dysuria, fevers, nausea/vomiting, flank pain.  Denies any STD concern or exposure but would like screening.  Has a history of yeast infections in the past and states this feels similar.  She did use an antifungal cream which did help.  No other concerns at that time.  HPI  Past Medical History:  Diagnosis Date   Abortion    Anemia 2009   s/p delivery   Anxiety    Chlamydia    Chronic back pain    Depression    Gonorrhea    Headache(784.0)    no treatment occasional headache   Hypertension    Obesity    Sleep apnea    per patient, no study    Patient Active Problem List   Diagnosis Date Noted   OSA (obstructive sleep apnea) 09/28/2019   Morbid obesity (HCC) 04/29/2015   Anxiety 03/28/2015   Depression 03/28/2015   Migraine with aura 06/18/2013    Past Surgical History:  Procedure Laterality Date   DILATION AND CURETTAGE OF UTERUS     INDUCED ABORTION     LAPAROSCOPIC TUBAL LIGATION Bilateral 01/15/2016   Procedure: LAPAROSCOPIC BILATERAL TUBAL LIGATION;  Surgeon: Tereso Newcomer, MD;  Location: [facility];  Service: Gynecology;  Laterality: Bilateral;    OB History     Gravida  4   Para  3   Term  2   Preterm  1   AB  1   Living  3      SAB  0   IAB  0   Ectopic  0   Multiple  0   Live Births  3        Obstetric Comments  2015 - attempted magnesium, SROM, rapid delivery, postpartum hemorrhage - cytotec          Home Medications    Prior to  Admission medications   Medication Sig Start Date End Date Taking? Authorizing Provider  fluconazole (DIFLUCAN) 150 MG tablet Take 1 tablet (150 mg total) by mouth daily for 2 doses. Take 1 tablet today and repeat 3 days if symptoms persist 03/12/23 03/14/23 Yes Radford Pax, NP  hydrochlorothiazide (HYDRODIURIL) 25 MG tablet Take 25 mg by mouth daily. 01/04/23  Yes [provider]  amoxicillin-clavulanate (AUGMENTIN) 875-125 MG tablet Take 1 tablet by mouth 2 (two) times daily. 12/07/22   Wallis Bamberg, PA-C  fexofenadine (ALLEGRA) 180 MG tablet Take 1 tablet (180 mg total) by mouth daily. 12/07/22   Wallis Bamberg, PA-C  fluticasone (FLONASE) 50 MCG/ACT nasal spray Place 2 sprays into both nostrils daily. 12/07/22   Wallis Bamberg, PA-C  pseudoephedrine (SUDAFED) 60 MG tablet Take 1 tablet (60 mg total) by mouth every 8 (eight) hours as needed for congestion. 12/07/22   Wallis Bamberg, PA-C    Family History Family History  Problem Relation Age of Onset   Hypertension Mother    Hypertension Paternal Aunt    Cancer  Maternal Grandfather    Stroke Maternal Grandfather    Hypertension Father    Asthma Son    Kidney disease Paternal Grandmother    Kidney disease Paternal Grandfather     Social History Social History   Tobacco Use   Smoking status: Former    Packs/day: 0.25    Years: 3.00    Additional pack years: 0.00    Total pack years: 0.75    Types: Cigarettes    Quit date: 06/08/2013    Years since quitting: 9.7   Smokeless tobacco: Never  Vaping Use   Vaping Use: Never used  Substance Use Topics   Alcohol use: Not Currently   Drug use: No     Allergies   Other   Review of Systems Review of Systems  Genitourinary:        Vaginal irritation       Physical Exam Triage Vital Signs ED Triage Vitals  Enc Vitals Group     BP 03/12/23 0904 115/78     Pulse Rate 03/12/23 0904 88     Resp 03/12/23 0904 16     Temp 03/12/23 0904 98 F (36.7 C)     Temp Source 03/12/23  0904 Oral     SpO2 03/12/23 0904 95 %     Weight --      Height --      Head Circumference --      Peak Flow --      Pain Score 03/12/23 0908 0     Pain Loc --      Pain Edu? --      Excl. in GC? --    No data found.  Updated Vital Signs BP 115/78 (BP Location: Right Arm)   Pulse 88   Temp 98 F (36.7 C) (Oral)   Resp 16   LMP 02/28/2023 (Approximate)   SpO2 95%   Visual Acuity Right Eye Distance:   Left Eye Distance:   Bilateral Distance:    Right Eye Near:   Left Eye Near:    Bilateral Near:     Physical Exam Vitals and nursing note reviewed.  Constitutional:      Appearance: Normal appearance.  HENT:     Head: Normocephalic and atraumatic.  Eyes:     Pupils: Pupils are equal, round, and reactive to light.  Cardiovascular:     Rate and Rhythm: Normal rate.  Pulmonary:     Effort: Pulmonary effort is normal.  Abdominal:     Tenderness: There is no right CVA tenderness or left CVA tenderness.  Skin:    General: Skin is warm and dry.  Neurological:     General: No focal deficit present.     Mental Status: She is alert and oriented to person, place, and time.  Psychiatric:        Mood and Affect: Mood normal.        Behavior: Behavior normal.      UC Treatments / Results  Labs (all labs ordered are listed, but only abnormal results are displayed) Labs Reviewed  POCT URINALYSIS DIP (MANUAL ENTRY) - Abnormal; Notable for the following components:      Result Value   Clarity, UA hazy (*)    Blood, UA trace-intact (*)    Protein Ur, POC >=300 (*)    All other components within normal limits  URINE CULTURE  POCT URINE PREGNANCY  CERVICOVAGINAL ANCILLARY ONLY    EKG   Radiology No results found.  Procedures  Procedures (including critical care time)  Medications Ordered in UC Medications - No data to display  Initial Impression / Assessment and Plan / UC Course  I have reviewed the triage vital signs and the nursing notes.  Pertinent labs  & imaging results that were available during my care of the patient were reviewed by me and considered in my medical decision making (see chart for details).     Reviewed exam and sx with patient. No red flags UA negative for UTI.  Trace hematuria, will culture.  hCG negative. Start diflucan  Will contact patient for any positive results of the testing done today PCP follow-up if symptoms do not improve ER precautions reviewed Final Clinical Impressions(s) / UC Diagnoses   Final diagnoses:  Vulvovaginitis  Hematuria, unspecified type     Discharge Instructions      Clinic will contact you with results of the testing done today if positive Start Diflucan as prescribed Follow-up with your PCP if symptoms do not improve Please go to the ER for any worsening symptoms     ED Prescriptions     Medication Sig Dispense Auth. Provider   fluconazole (DIFLUCAN) 150 MG tablet Take 1 tablet (150 mg total) by mouth daily for 2 doses. Take 1 tablet today and repeat 3 days if symptoms persist 2 tablet Radford Pax, NP      PDMP not reviewed this encounter.   Radford Pax, NP 03/12/23 [phone]

## 2023-03-12 NOTE — Discharge Instructions (Addendum)
Clinic will contact you with results of the testing done today if positive Start Diflucan as prescribed Follow-up with your PCP if symptoms do not improve Please go to the ER for any worsening symptoms

## 2023-03-12 NOTE — ED Triage Notes (Addendum)
Pt presents to UC w/ c/o vaginal irritation, light burning sensation that extends to right buttock since yesterday. Pt states she was wearing a wet bathing suit all day a few days ago. Pt denies vaginal discharge or odor. Used antifungal cream which helped the itching.

## 2023-03-14 LAB — CERVICOVAGINAL ANCILLARY ONLY
Bacterial Vaginitis (gardnerella): POSITIVE — AB
Candida Glabrata: NEGATIVE
Candida Vaginitis: NEGATIVE
Chlamydia: NEGATIVE
Comment: NEGATIVE
Comment: NEGATIVE
Comment: NEGATIVE
Comment: NEGATIVE
Comment: NEGATIVE
Comment: NORMAL
Neisseria Gonorrhea: NEGATIVE
Trichomonas: NEGATIVE

## 2023-03-15 ENCOUNTER — Telehealth: Payer: Self-pay | Admitting: Emergency Medicine

## 2023-03-15 LAB — URINE CULTURE: Culture: 10000 — AB

## 2023-03-15 MED ORDER — NITROFURANTOIN MONOHYD MACRO 100 MG PO CAPS: 100.0000 mg | ORAL_CAPSULE | Freq: Two times a day (BID) | ORAL | 0 refills | Status: DC

## 2023-03-15 MED ORDER — METRONIDAZOLE 500 MG PO TABS: 500.0000 mg | ORAL_TABLET | Freq: Two times a day (BID) | ORAL | 0 refills | Status: DC

## 2023-03-15 NOTE — Telephone Encounter (Signed)
Per protocol pt need tx with Macrobid and metronidazole. Reviewed with pt, verified pharmacy. Prescription sent.

## 2023-03-18 ENCOUNTER — Ambulatory Visit
Admission: RE | Admit: 2023-03-18 | Discharge: 2023-03-18 | Disposition: A | Discharge status: Home / Self Care | Payer: Medicaid Other | Source: Ambulatory Visit | Attending: Urgent Care

## 2023-03-18 VITALS — BP 138/93 | HR 75 | Temp 98.7°F | Resp 18

## 2023-03-18 DIAGNOSIS — L739 Follicular disorder, unspecified: Secondary | ICD-10-CM

## 2023-03-18 DIAGNOSIS — N760 Acute vaginitis: Secondary | ICD-10-CM

## 2023-03-18 DIAGNOSIS — B9689 Other specified bacterial agents as the cause of diseases classified elsewhere: Secondary | ICD-10-CM | POA: Diagnosis not present

## 2023-03-18 MED ORDER — FLUCONAZOLE 150 MG PO TABS: 150.0000 mg | ORAL_TABLET | ORAL | 0 refills | Status: DC

## 2023-03-18 MED ORDER — CEPHALEXIN 500 MG PO CAPS: 500.0000 mg | ORAL_CAPSULE | Freq: Three times a day (TID) | ORAL | 0 refills | Status: DC

## 2023-03-18 NOTE — ED Notes (Signed)
In with Mani, PA-C for exam 

## 2023-03-18 NOTE — ED Provider Notes (Signed)
[facility]  Note:  This document was prepared using Conservation officer, historic buildings and may include unintentional dictation errors.  MRN: [id number] DOB: 06/25/1986  Subjective:   Sandra Wong is a 37 y.o. female presenting for ongoing vaginal irritation, stinging and painful sensation in the genital area.  Patient was seen 03/12/2023.  She was started on fluconazole empirically.  Then the results showed she actually had an urinary tract infection and bacterial vaginosis and therefore was started on Macrobid and metronidazole.  This has not helped her vaginal irritation/genital pain.  No drainage of pus or bleeding.  No active vaginal discharge.  No painful urination.  She has been using multiple over-the-counter measures to the pelvic/pubic area.  No current facility-administered medications for this encounter.  Current Outpatient Medications:    amoxicillin-clavulanate (AUGMENTIN) 875-125 MG tablet, Take 1 tablet by mouth 2 (two) times daily., Disp: 14 tablet, Rfl: 0   fexofenadine (ALLEGRA) 180 MG tablet, Take 1 tablet (180 mg total) by mouth daily., Disp: 90 tablet, Rfl: 0   fluticasone (FLONASE) 50 MCG/ACT nasal spray, Place 2 sprays into both nostrils daily., Disp: 16 g, Rfl: 0   hydrochlorothiazide (HYDRODIURIL) 25 MG tablet, Take 25 mg by mouth daily., Disp: , Rfl:    metroNIDAZOLE (FLAGYL) 500 MG tablet, Take 1 tablet (500 mg total) by mouth 2 (two) times daily., Disp: 14 tablet, Rfl: 0   nitrofurantoin, macrocrystal-monohydrate, (MACROBID) 100 MG capsule, Take 1 capsule (100 mg total) by mouth 2 (two) times daily., Disp: 10 capsule, Rfl: 0   pseudoephedrine (SUDAFED) 60 MG tablet, Take 1 tablet (60 mg total) by mouth every 8 (eight) hours as needed for congestion., Disp: 30 tablet, Rfl: 0   Allergies  Allergen Reactions   Other     Has an allergy to some type of eye drop but does not remember the name (in childhood)    Past Medical History:   Diagnosis Date   Abortion    Anemia 2009   s/p delivery   Anxiety    Chlamydia    Chronic back pain    Depression    Gonorrhea    Headache(784.0)    no treatment occasional headache   Hypertension    Obesity    Sleep apnea    per patient, no study     Past Surgical History:  Procedure Laterality Date   DILATION AND CURETTAGE OF UTERUS     INDUCED ABORTION     LAPAROSCOPIC TUBAL LIGATION Bilateral 01/15/2016   Procedure: LAPAROSCOPIC BILATERAL TUBAL LIGATION;  Surgeon: Tereso Newcomer, MD;  Location: [facility];  Service: Gynecology;  Laterality: Bilateral;    Family History  Problem Relation Age of Onset   Hypertension Mother    Hypertension Paternal Aunt    Cancer Maternal Grandfather    Stroke Maternal Grandfather    Hypertension Father    Asthma Son    Kidney disease Paternal Grandmother    Kidney disease Paternal Grandfather     Social History   Tobacco Use   Smoking status: Former    Packs/day: 0.25    Years: 3.00    Additional pack years: 0.00    Total pack years: 0.75    Types: Cigarettes    Quit date: 06/08/2013    Years since quitting: 9.7   Smokeless tobacco: Never  Vaping Use   Vaping Use: Never used  Substance Use Topics   Alcohol use: Not Currently   Drug use: No    ROS  Objective:   Vitals: LMP 02/28/2023 (Approximate)   Physical Exam Exam conducted with a chaperone present Restaurant manager, fast food).  Constitutional:      General: She is not in acute distress.    Appearance: Normal appearance. She is well-developed. She is not ill-appearing, toxic-appearing or diaphoretic.  HENT:     Head: Normocephalic and atraumatic.     Nose: Nose normal.     Mouth/Throat:     Mouth: Mucous membranes are moist.  Eyes:     General: No scleral icterus.       Right eye: No discharge.        Left eye: No discharge.     Extraocular Movements: Extraocular movements intact.  Cardiovascular:     Rate and Rhythm: Normal rate.  Pulmonary:     Effort:  Pulmonary effort is normal.  Genitourinary:      Comments: UV lamp did not demonstrate any particular signs of erythrasma. Skin:    General: Skin is warm and dry.  Neurological:     General: No focal deficit present.     Mental Status: She is alert and oriented to person, place, and time.  Psychiatric:        Mood and Affect: Mood normal.        Behavior: Behavior normal.     Assessment and Plan :   PDMP not reviewed this encounter.  1. Vaginitis and vulvovaginitis   2. Folliculitis   3. Bacterial vaginosis    Recommended using an extended course of fluconazole.  Stop Macrobid and switch to cephalexin which should provide coverage for her urinary tract infection.  Maintain metronidazole.  Counseled patient on potential for adverse effects with medications prescribed/recommended today, ER and return-to-clinic precautions discussed, patient verbalized understanding.    Wallis Bamberg, PA-C 03/18/23 1147

## 2023-03-18 NOTE — Discharge Instructions (Addendum)
Stop nitrofurantoin and switch to cephalexin, a different antibiotic for the UTI. This may also have an infection called folliculitis of the pubic area. Finish metronidazole for BV.

## 2023-03-18 NOTE — ED Triage Notes (Signed)
Pt reports vaginal itching has not improved after using fluconazole and metronidazole, Macrobid, since 03/15/23. Pt tested positive for BV 6 days ago.

## 2023-04-26 ENCOUNTER — Encounter (HOSPITAL_BASED_OUTPATIENT_CLINIC_OR_DEPARTMENT_OTHER): Payer: Self-pay

## 2023-04-26 DIAGNOSIS — R5383 Other fatigue: Secondary | ICD-10-CM

## 2023-04-26 DIAGNOSIS — R0683 Snoring: Secondary | ICD-10-CM

## 2023-04-26 DIAGNOSIS — G4733 Obstructive sleep apnea (adult) (pediatric): Secondary | ICD-10-CM

## 2023-04-26 DIAGNOSIS — G4709 Other insomnia: Secondary | ICD-10-CM

## 2023-06-19 LAB — HM PAP SMEAR: HM Pap smear: NORMAL

## 2023-09-12 ENCOUNTER — Encounter (HOSPITAL_COMMUNITY): Payer: Self-pay | Admitting: Emergency Medicine

## 2023-09-12 ENCOUNTER — Emergency Department (HOSPITAL_COMMUNITY): Payer: Medicaid Other

## 2023-09-12 ENCOUNTER — Emergency Department (HOSPITAL_COMMUNITY)
Admission: EM | Admit: 2023-09-12 | Discharge: 2023-09-12 | Disposition: A | Discharge status: Home / Self Care | Payer: Medicaid Other | Attending: Emergency Medicine | Admitting: Emergency Medicine

## 2023-09-12 DIAGNOSIS — Z79899 Other long term (current) drug therapy: Secondary | ICD-10-CM | POA: Insufficient documentation

## 2023-09-12 DIAGNOSIS — D649 Anemia, unspecified: Secondary | ICD-10-CM | POA: Insufficient documentation

## 2023-09-12 DIAGNOSIS — R072 Precordial pain: Secondary | ICD-10-CM | POA: Insufficient documentation

## 2023-09-12 LAB — CBC WITH DIFFERENTIAL/PLATELET
Abs Immature Granulocytes: 0.05 10*3/uL (ref 0.00–0.07)
Basophils Absolute: 0 10*3/uL (ref 0.0–0.1)
Basophils Relative: 0 %
Eosinophils Absolute: 0.5 10*3/uL (ref 0.0–0.5)
Eosinophils Relative: 6 %
HCT: 37.1 % (ref 36.0–46.0)
Hemoglobin: 11.5 g/dL — ABNORMAL LOW (ref 12.0–15.0)
Immature Granulocytes: 1 %
Lymphocytes Relative: 38 %
Lymphs Abs: 3.3 10*3/uL (ref 0.7–4.0)
MCH: 25.9 pg — ABNORMAL LOW (ref 26.0–34.0)
MCHC: 31 g/dL (ref 30.0–36.0)
MCV: 83.6 fL (ref 80.0–100.0)
Monocytes Absolute: 0.5 10*3/uL (ref 0.1–1.0)
Monocytes Relative: 6 %
Neutro Abs: 4.4 10*3/uL (ref 1.7–7.7)
Neutrophils Relative %: 49 %
Platelets: 274 10*3/uL (ref 150–400)
RBC: 4.44 MIL/uL (ref 3.87–5.11)
RDW: 16.2 % — ABNORMAL HIGH (ref 11.5–15.5)
WBC: 8.9 10*3/uL (ref 4.0–10.5)
nRBC: 0 % (ref 0.0–0.2)

## 2023-09-12 LAB — BASIC METABOLIC PANEL
Anion gap: 5 (ref 5–15)
BUN: 18 mg/dL (ref 6–20)
CO2: 24 mmol/L (ref 22–32)
Calcium: 8.3 mg/dL — ABNORMAL LOW (ref 8.9–10.3)
Chloride: 106 mmol/L (ref 98–111)
Creatinine, Ser: 1.08 mg/dL — ABNORMAL HIGH (ref 0.44–1.00)
GFR, Estimated: >60 mL/min (ref >60)
Glucose, Bld: 98 mg/dL (ref 70–99)
Potassium: 4 mmol/L (ref 3.5–5.1)
Sodium: 135 mmol/L (ref 135–145)

## 2023-09-12 LAB — TROPONIN I (HIGH SENSITIVITY): Troponin I (High Sensitivity): <2 ng/L (ref <18)

## 2023-09-12 LAB — HCG, SERUM, QUALITATIVE: Preg, Serum: NEGATIVE

## 2023-09-12 MED ORDER — ALUM & MAG HYDROXIDE-SIMETH 200-200-20 MG/5ML PO SUSP
30.0000 mL | Freq: Once | ORAL | Status: AC
  Administered 2023-09-12: 30 mL via ORAL
  Filled 2023-09-12: qty 30

## 2023-09-12 MED ORDER — OMEPRAZOLE 20 MG PO CPDR: 20.0000 mg | DELAYED_RELEASE_CAPSULE | Freq: Every day | ORAL | 0 refills | Status: AC

## 2023-09-12 NOTE — ED Provider Notes (Signed)
[place] EMERGENCY DEPARTMENT AT [facility] Provider Note   CSN: [id number] Arrival date & time: 09/12/23  0304     History  Chief Complaint  Patient presents with   Chest Pain    Sandra Wong is a 37 y.o. female.  The history is provided by the patient.  Chest Pain Pain location:  Substernal area Pain quality: burning and tightness   Pain quality: not stabbing and not tearing   Pain severity:  Moderate Onset quality:  Gradual Duration:  5 days Timing:  Constant Progression:  Waxing and waning Chronicity:  New Context: at rest   Associated symptoms: no abdominal pain, no fever, no palpitations, no shortness of breath and no vomiting   Risk factors: no high cholesterol   No leg pain no SOB.  No DOE.  No n/v/d.       Home Medications Prior to Admission medications   Medication Sig Start Date End Date Taking? Authorizing Provider  fluticasone (FLONASE) 50 MCG/ACT nasal spray Place 2 sprays into both nostrils daily. Patient taking differently: Place 2 sprays into both nostrils daily as needed. 12/07/22  Yes Wallis Bamberg, PA-C  hydrochlorothiazide (HYDRODIURIL) 25 MG tablet Take 25 mg by mouth daily. 01/04/23  Yes [provider]  omeprazole (PRILOSEC) 20 MG capsule Take 1 capsule (20 mg total) by mouth daily. 09/12/23  Yes Anysha Frappier, MD  pseudoephedrine (SUDAFED) 60 MG tablet Take 1 tablet (60 mg total) by mouth every 8 (eight) hours as needed for congestion. 12/07/22  Yes Wallis Bamberg, PA-C  amoxicillin-clavulanate (AUGMENTIN) 875-125 MG tablet Take 1 tablet by mouth 2 (two) times daily. Patient not taking: Reported on 09/12/2023 12/07/22   Wallis Bamberg, PA-C  Butalbital-APAP-Caffeine (FIORICET) 50-300-40 MG CAPS Take 1 capsule by mouth every 4 (four) hours. Patient not taking: Reported on 09/12/2023    [provider]  cephALEXin (KEFLEX) 500 MG capsule Take 1 capsule (500 mg total) by mouth 3 (three) times daily. Patient not taking:  Reported on 09/12/2023 03/18/23   Wallis Bamberg, PA-C  ciprofloxacin (CIPRO) 250 MG tablet Take 250 mg by mouth daily. Patient not taking: Reported on 09/12/2023 06/20/23   [provider]  fexofenadine (ALLEGRA) 180 MG tablet Take 1 tablet (180 mg total) by mouth daily. Patient not taking: Reported on 09/12/2023 12/07/22   Wallis Bamberg, PA-C  fluconazole (DIFLUCAN) 150 MG tablet Take 1 tablet (150 mg total) by mouth every 3 (three) days. Patient not taking: Reported on 09/12/2023 03/18/23   Wallis Bamberg, PA-C  metroNIDAZOLE (FLAGYL) 500 MG tablet Take 1 tablet (500 mg total) by mouth 2 (two) times daily. Patient not taking: Reported on 09/12/2023 03/15/23   Merrilee Jansky, MD  nitrofurantoin, macrocrystal-monohydrate, (MACROBID) 100 MG capsule Take 1 capsule (100 mg total) by mouth 2 (two) times daily. Patient not taking: Reported on 09/12/2023 03/15/23   Merrilee Jansky, MD  nystatin ointment (MYCOSTATIN) Apply 1 Application topically 2 (two) times daily. Patient not taking: Reported on 09/12/2023 05/07/23   [provider]      Allergies    Other    Review of Systems   Review of Systems  Constitutional:  Negative for fever.  HENT:  Negative for facial swelling.   Eyes:  Negative for redness.  Respiratory:  Negative for shortness of breath, wheezing and stridor.   Cardiovascular:  Positive for chest pain. Negative for palpitations and leg swelling.  Gastrointestinal:  Negative for abdominal pain and vomiting.  All other systems reviewed and are  negative.   Physical Exam Updated Vital Signs BP (!) 140/91   Pulse 72   Temp (!) 97.5 F (36.4 C) (Oral)   Resp 13   Ht 5\' 9"  (1.753 m)   Wt 117.9 kg   LMP 08/29/2023   SpO2 100%   BMI 38.40 kg/m  Physical Exam Vitals and nursing note reviewed.  Constitutional:      General: She is not in acute distress.    Appearance: She is well-developed.  HENT:     Head: Normocephalic and atraumatic.     Nose: Nose normal.   Eyes:     Pupils: Pupils are equal, round, and reactive to light.  Cardiovascular:     Rate and Rhythm: Normal rate and regular rhythm.     Pulses: Normal pulses.     Heart sounds: Normal heart sounds.  Pulmonary:     Effort: Pulmonary effort is normal. No respiratory distress.     Breath sounds: Normal breath sounds.  Chest:     Chest wall: No tenderness.  Abdominal:     General: Abdomen is flat. Bowel sounds are normal. There is no distension.     Palpations: Abdomen is soft.     Tenderness: There is no abdominal tenderness. There is no guarding or rebound.  Musculoskeletal:        General: Normal range of motion.     Cervical back: Normal range of motion and neck supple.  Skin:    General: Skin is dry.     Capillary Refill: Capillary refill takes less than 2 seconds.     Findings: No erythema or rash.  Neurological:     General: No focal deficit present.     Deep Tendon Reflexes: Reflexes normal.  Psychiatric:        Mood and Affect: Mood normal.     ED Results / Procedures / Treatments   Labs (all labs ordered are listed, but only abnormal results are displayed) Results for orders placed or performed during the hospital encounter of 09/12/23  CBC with Differential   Collection Time: 09/12/23  3:32 AM  Result Value Ref Range   WBC 8.9 4.0 - 10.5 K/uL   RBC 4.44 3.87 - 5.11 MIL/uL   Hemoglobin 11.5 (L) 12.0 - 15.0 g/dL   HCT 96.0 45.4 - 09.8 %   MCV 83.6 80.0 - 100.0 fL   MCH 25.9 (L) 26.0 - 34.0 pg   MCHC 31.0 30.0 - 36.0 g/dL   RDW 11.9 (H) 14.7 - 82.9 %   Platelets 274 150 - 400 K/uL   nRBC 0.0 0.0 - 0.2 %   Neutrophils Relative % 49 %   Neutro Abs 4.4 1.7 - 7.7 K/uL   Lymphocytes Relative 38 %   Lymphs Abs 3.3 0.7 - 4.0 K/uL   Monocytes Relative 6 %   Monocytes Absolute 0.5 0.1 - 1.0 K/uL   Eosinophils Relative 6 %   Eosinophils Absolute 0.5 0.0 - 0.5 K/uL   Basophils Relative 0 %   Basophils Absolute 0.0 0.0 - 0.1 K/uL   Immature Granulocytes 1 %    Abs Immature Granulocytes 0.05 0.00 - 0.07 K/uL  Basic metabolic panel   Collection Time: 09/12/23  3:32 AM  Result Value Ref Range   Sodium 135 135 - 145 mmol/L   Potassium 4.0 3.5 - 5.1 mmol/L   Chloride 106 98 - 111 mmol/L   CO2 24 22 - 32 mmol/L   Glucose, Bld 98 70 - 99 mg/dL  BUN 18 6 - 20 mg/dL   Creatinine, Ser 1.61 (H) 0.44 - 1.00 mg/dL   Calcium 8.3 (L) 8.9 - 10.3 mg/dL   GFR, Estimated >09 >60 mL/min   Anion gap 5 5 - 15  hCG, serum, qualitative   Collection Time: 09/12/23  3:32 AM  Result Value Ref Range   Preg, Serum NEGATIVE NEGATIVE  Troponin I (High Sensitivity)   Collection Time: 09/12/23  3:32 AM  Result Value Ref Range   Troponin I (High Sensitivity) <2 <18 ng/L   DG Chest 2 View Result Date: 09/12/2023 CLINICAL DATA:  Chest pain EXAM: CHEST - 2 VIEW COMPARISON:  Chest x-ray 04/05/2018 FINDINGS: The heart size and mediastinal contours are within normal limits. Both lungs are clear. The visualized skeletal structures are unremarkable. IMPRESSION: No active cardiopulmonary disease. Electronically Signed   By: Darliss Cheney M.D.   On: 09/12/2023 03:59    EKG NSR, has not crossed over   Radiology DG Chest 2 View Result Date: 09/12/2023 CLINICAL DATA:  Chest pain EXAM: CHEST - 2 VIEW COMPARISON:  Chest x-ray 04/05/2018 FINDINGS: The heart size and mediastinal contours are within normal limits. Both lungs are clear. The visualized skeletal structures are unremarkable. IMPRESSION: No active cardiopulmonary disease. Electronically Signed   By: Darliss Cheney M.D.   On: 09/12/2023 03:59    Procedures Procedures    Medications Ordered in ED Medications  alum & mag hydroxide-simeth (MAALOX/MYLANTA) 200-200-20 MG/5ML suspension 30 mL (30 mLs Oral Given 09/12/23 0458)    ED Course/ Medical Decision Making/ A&P                                 Medical Decision Making Patient with pain for 5 days  Amount and/or Complexity of Data Reviewed External Data  Reviewed: notes.    Details: Previous notes reviewed  Labs: ordered.    Details: Pregnancy is negative, troponin is normal < 2. Normal white count 8.9, low hemoglobin 11.5, normal platelets , normal sodium 135, normal potassium, normal creatinine 1.08  Radiology: ordered and independent interpretation performed.    Details: Negative CXR by me   Risk OTC drugs. Prescription drug management. Risk Details: Well appearing,  PERC negative wells negative highly doubt PE in this low risk patient.  Symptoms are not consistent with PE.  Ruled out for MI in the ED with normal EKG and normal troponin.  Symptoms concerning for GERD.  Have advised bland diet and will start PPI.  Follow up with your PMD for ongoing care.      Final Clinical Impression(s) / ED Diagnoses Final diagnoses:  Precordial pain   Return for intractable cough, coughing up blood, fevers > 100.4 unrelieved by medication, shortness of breath, intractable vomiting, chest pain, shortness of breath, weakness, numbness, changes in speech, facial asymmetry, abdominal pain, passing out, Inability to tolerate liquids or food, cough, altered mental status or any concerns. No signs of systemic illness or infection. The patient is nontoxic-appearing on exam and vital signs are within normal limits.  I have reviewed the triage vital signs and the nursing notes. Pertinent labs & imaging results that were available during my care of the patient were reviewed by me and considered in my medical decision making (see chart for details). After history, exam, and medical workup I feel the patient has been appropriately medically screened and is safe for discharge home. Pertinent diagnoses were discussed with the patient. Patient was  given return precautions.  Rx / DC Orders ED Discharge Orders          Ordered    omeprazole (PRILOSEC) 20 MG capsule  Daily        09/12/23 0559              Kirsta Probert, MD 09/12/23 4010

## 2023-09-12 NOTE — ED Triage Notes (Signed)
Pt having centralized chest pain that is intermittent and radiates around to back and makes her throat feel tight. She has been having it since Thursday. She endorsees SOB and appears to be in no distress. Denies taking her HTN meds since Thurs. Stopped taking potassium as well.

## 2023-09-12 NOTE — ED Notes (Signed)
Pt phlebotomy attempt successful. Pt has red top tube in lab along with routine lab work.

## 2023-10-17 ENCOUNTER — Encounter (HOSPITAL_BASED_OUTPATIENT_CLINIC_OR_DEPARTMENT_OTHER): Payer: Self-pay | Admitting: Internal Medicine

## 2023-10-17 DIAGNOSIS — R0683 Snoring: Secondary | ICD-10-CM

## 2024-03-05 ENCOUNTER — Encounter: Payer: Self-pay | Admitting: Obstetrics & Gynecology

## 2024-03-14 ENCOUNTER — Encounter: Admitting: Obstetrics and Gynecology

## 2024-05-01 NOTE — Procedures (Signed)
 SABRA

## 2024-05-15 ENCOUNTER — Encounter: Payer: Self-pay | Admitting: Obstetrics & Gynecology

## 2024-05-15 ENCOUNTER — Ambulatory Visit: Admitting: Obstetrics & Gynecology

## 2024-05-15 VITALS — BP 128/83 | HR 78 | Ht 69.0 in | Wt 345.0 lb

## 2024-05-15 DIAGNOSIS — Z1331 Encounter for screening for depression: Secondary | ICD-10-CM | POA: Diagnosis not present

## 2024-05-15 DIAGNOSIS — N946 Dysmenorrhea, unspecified: Secondary | ICD-10-CM | POA: Diagnosis not present

## 2024-05-15 DIAGNOSIS — Z7689 Persons encountering health services in other specified circumstances: Secondary | ICD-10-CM

## 2024-05-15 NOTE — Progress Notes (Signed)
   GYNECOLOGY OFFICE VISIT NOTE  History:  Sandra Wong is a 38 y.o. [phone] here today to establish care.  Had annual exam, STI screening and pap smear less than one year ago at her PCP's office.  She denies any abnormal vaginal discharge, bleeding, but reports dysmenorrhea the first two days of her period. No other concerns.  Past Medical History:  Diagnosis Date   Abortion    Anemia 2009   s/p delivery   Anxiety    Chlamydia    Chronic back pain    Depression    Gonorrhea    Headache(784.0)    no treatment occasional headache   Hypertension    Obesity    Sleep apnea    per patient, no study    Past Surgical History:  Procedure Laterality Date   DILATION AND CURETTAGE OF UTERUS     INDUCED ABORTION     LAPAROSCOPIC TUBAL LIGATION Bilateral 01/15/2016   Procedure: LAPAROSCOPIC BILATERAL TUBAL LIGATION;  Surgeon: Gloris DELENA Hugger, MD;  Location: [facility];  Service: Gynecology;  Laterality: Bilateral;    The following portions of the patient's history were reviewed and updated as appropriate: allergies, current medications, past family history, past medical history, past social history, past surgical history and problem list.   Health Maintenance:  Normal pap in 2024 at her PCP's office [facility]).  Review of Systems:  Pertinent items noted in HPI and remainder of comprehensive ROS otherwise negative.  Physical Exam:  BP 128/83 (BP Location: Left Arm, Patient Position: Sitting, Cuff Size: Large)   Pulse 78   Ht 5' 9 (1.753 m)   Wt (!) 345 lb (156.5 kg)   LMP 04/15/2024   BMI 50.95 kg/m  CONSTITUTIONAL: Well-developed, well-nourished female in no acute distress.  HEENT:  Normocephalic, atraumatic. External right and left ear normal. No scleral icterus.  NECK: Normal range of motion, supple, no masses noted on observation SKIN: No rash noted. Not diaphoretic. No erythema. No pallor. MUSCULOSKELETAL: Normal range of motion. No edema  noted. NEUROLOGIC: Alert and oriented to person, place, and time. Normal muscle tone coordination. No cranial nerve deficit noted on observation. PSYCHIATRIC: Normal mood and affect. Normal behavior. Normal judgment and thought content. CARDIOVASCULAR: Normal heart rate noted RESPIRATORY: Effort and breath sounds normal, no problems with respiration noted BREASTS: Symmetric in size. No masses, tenderness, skin changes, nipple drainage, or lymphadenopathy bilaterally. ABDOMEN: No masses or other overt distention noted on observation. No tenderness.   PELVIC: Deferred by patient  Assessment and Plan:     1. Dysmenorrhea Discussed management of dysmenorrhea, recommended NSAID therapy (Ibuprofen  vs Naproxen) to help. Discussed appropriate dosages.  She will let us  know if this does not work, and we can discuss alternatives.  2. Encounter to establish care (Primary) Welcomed patient to our practice, discussed nature of our practice. All questions answered. She will have her PCP's office send us  her pap smear results.  Routine preventative health maintenance measures emphasized.   Return for any gynecologic concerns.    I spent 30 minutes dedicated to the care of this patient including pre-visit review of records, face to face time with the patient discussing her conditions and treatments, post visit ordering of medications and appropriate tests or procedures, coordinating care and documenting this visit encounter.    GLORIS HUGGER, MD, FACOG Obstetrician & Gynecologist, [facility] for [employer], [facility]

## 2024-06-30 ENCOUNTER — Emergency Department (HOSPITAL_COMMUNITY): Admission: EM | Admit: 2024-06-30 | Discharge: 2024-06-30 | Disposition: A | Discharge status: Home / Self Care

## 2024-06-30 ENCOUNTER — Other Ambulatory Visit: Payer: Self-pay

## 2024-06-30 ENCOUNTER — Emergency Department (HOSPITAL_COMMUNITY)

## 2024-06-30 DIAGNOSIS — M791 Myalgia, unspecified site: Secondary | ICD-10-CM

## 2024-06-30 DIAGNOSIS — I10 Essential (primary) hypertension: Secondary | ICD-10-CM | POA: Insufficient documentation

## 2024-06-30 DIAGNOSIS — R10A2 Flank pain, left side: Secondary | ICD-10-CM

## 2024-06-30 DIAGNOSIS — R109 Unspecified abdominal pain: Secondary | ICD-10-CM | POA: Insufficient documentation

## 2024-06-30 LAB — HCG, SERUM, QUALITATIVE: Preg, Serum: NEGATIVE

## 2024-06-30 LAB — COMPREHENSIVE METABOLIC PANEL WITH GFR
ALT: 14 U/L (ref 0–44)
AST: 15 U/L (ref 15–41)
Albumin: 3.7 g/dL (ref 3.5–5.0)
Alkaline Phosphatase: 50 U/L (ref 38–126)
Anion gap: 10 (ref 5–15)
BUN: 20 mg/dL (ref 6–20)
CO2: 25 mmol/L (ref 22–32)
Calcium: 9.4 mg/dL (ref 8.9–10.3)
Chloride: 101 mmol/L (ref 98–111)
Creatinine, Ser: 1.15 mg/dL — ABNORMAL HIGH (ref 0.44–1.00)
GFR, Estimated: >60 mL/min
Glucose, Bld: 79 mg/dL (ref 70–99)
Potassium: 4.3 mmol/L (ref 3.5–5.1)
Sodium: 135 mmol/L (ref 135–145)
Total Bilirubin: 0.4 mg/dL (ref 0.0–1.2)
Total Protein: 8 g/dL (ref 6.5–8.1)

## 2024-06-30 LAB — CBC
HCT: 40.1 % (ref 36.0–46.0)
Hemoglobin: 12.4 g/dL (ref 12.0–15.0)
MCH: 25.2 pg — ABNORMAL LOW (ref 26.0–34.0)
MCHC: 30.9 g/dL (ref 30.0–36.0)
MCV: 81.5 fL (ref 80.0–100.0)
Platelets: 275 K/uL (ref 150–400)
RBC: 4.92 MIL/uL (ref 3.87–5.11)
RDW: 16.2 % — ABNORMAL HIGH (ref 11.5–15.5)
WBC: 7.7 K/uL (ref 4.0–10.5)
nRBC: 0 % (ref 0.0–0.2)

## 2024-06-30 LAB — URINALYSIS, ROUTINE W REFLEX MICROSCOPIC
Bacteria, UA: NONE SEEN
Bilirubin Urine: NEGATIVE
Glucose, UA: NEGATIVE mg/dL
Hgb urine dipstick: NEGATIVE
Ketones, ur: NEGATIVE mg/dL
Leukocytes,Ua: NEGATIVE
Nitrite: NEGATIVE
Protein, ur: 100 mg/dL — AB
Specific Gravity, Urine: 1.012 (ref 1.005–1.030)
pH: 5 (ref 5.0–8.0)

## 2024-06-30 MED ORDER — LACTATED RINGERS IV BOLUS
1000.0000 mL | Freq: Once | INTRAVENOUS | Status: AC
  Administered 2024-06-30: 1000 mL via INTRAVENOUS

## 2024-06-30 MED ORDER — LIDOCAINE 5 % EX PTCH: 1.0000 | MEDICATED_PATCH | CUTANEOUS | 0 refills | Status: AC

## 2024-06-30 MED ORDER — METHOCARBAMOL 500 MG PO TABS
1000.0000 mg | ORAL_TABLET | Freq: Once | ORAL | Status: AC
Start: 2024-06-30 — End: 2024-06-30
  Administered 2024-06-30: 1000 mg via ORAL
  Filled 2024-06-30: qty 2

## 2024-06-30 MED ORDER — METHOCARBAMOL 500 MG PO TABS: 500.0000 mg | ORAL_TABLET | Freq: Two times a day (BID) | ORAL | 0 refills | Status: AC

## 2024-06-30 MED ORDER — LIDOCAINE 5 % EX PTCH
1.0000 | MEDICATED_PATCH | CUTANEOUS | Status: DC
  Administered 2024-06-30: 1 via TRANSDERMAL
  Filled 2024-06-30: qty 1

## 2024-06-30 NOTE — Discharge Instructions (Signed)
 Your CT scan is without any kidney stones.  I suspect that this is muscular pain.  Lidocaine  patches Tylenol  and ibuprofen  and muscle relaxers for pain.  Please follow-up with your primary care provider  Please use Tylenol  or ibuprofen  for pain.  You may use 600 mg ibuprofen  every 6 hours or 1000 mg of Tylenol  every 6 hours.  You may choose to alternate between the 2.  This would be most effective.  Not to exceed 4 g of Tylenol  within 24 hours.  Not to exceed 3200 mg ibuprofen  24 hours.

## 2024-06-30 NOTE — ED Provider Notes (Signed)
 38 yo female with back pain x 2 weeks. Pain reproduced with palpation. If stone, needs urology follow up.  Physical Exam  BP (!) 145/86 (BP Location: Left Arm)   Pulse 75   Temp 97.6 F (36.4 C) (Oral)   Resp 15   Ht 5' 9 (1.753 m)   Wt (!) 164.2 kg   LMP 06/23/2024   SpO2 99%   BMI 53.46 kg/m   Physical Exam  Procedures  Procedures  ED Course / MDM    Medical Decision Making Amount and/or Complexity of Data Reviewed Labs: ordered. Radiology: ordered.  Risk Prescription drug management.   CT negative for kidney stone.  Results discussed with patient, provided with a copy of her results from today's visit.  Recommend recheck with PCP in 2 days.  Return to ER for worsening or concerning symptoms.  Take medications as prescribed.       Beverley Leita LABOR, PA-C 06/30/24 1600    Patt Alm Macho, MD 06/30/24 [phone]

## 2024-06-30 NOTE — ED Triage Notes (Signed)
 Patient to ED by POV with c/o left side flank pain. Pain started 2 weeks ago denies trauma or injury to cause pain. Inhaling, moving, stretching and cough all makes pain worse.

## 2024-06-30 NOTE — ED Provider Notes (Signed)
 [place] EMERGENCY DEPARTMENT AT [facility] Provider Note   CSN: [id number] Arrival date & time: 06/30/24  9068     Patient presents with: Flank Pain   Sandra Wong is a 38 y.o. female.    Flank Pain   Patient is a 38 year old female with a past medical history significant for migraines, anxiety, depression, morbid obesity, OSA, HTN  She presents emergency room today with complaints of left flank pain seems that she every so often has an episode of pain that comes on had sharp stabbing left flank pain which will then resolve on its own.  She has been seen multiple times and told that this is likely muscular but she is very concerned that she has a kidney stone or some other cause of these symptoms.  She denies any nausea or vomiting no chest pain difficulty breathing no hemoptysis the pain in her flank is not worse with breathing no history of DVT.  She states that she does have more pain with coughing and certain movements.       Prior to Admission medications   Medication Sig Start Date End Date Taking? Authorizing Provider  diltiazem (CARDIZEM CD) 120 MG 24 hr capsule Take 120 mg by mouth daily. 04/29/24   [provider]  fluticasone  (FLONASE ) 50 MCG/ACT nasal spray Place 2 sprays into both nostrils daily. Patient not taking: Reported on 05/15/2024 12/07/22   Christopher Savannah, PA-C  MOUNJARO 2.5 MG/0.5ML Pen SMARTSIG:2.5 Milligram(s) SUB-Q Once a Week 05/03/24   [provider]  omeprazole  (PRILOSEC) 20 MG capsule Take 1 capsule (20 mg total) by mouth daily. Patient not taking: Reported on 05/15/2024 09/12/23   Palumbo, April, MD  pseudoephedrine  (SUDAFED) 60 MG tablet Take 1 tablet (60 mg total) by mouth every 8 (eight) hours as needed for congestion. Patient not taking: Reported on 05/15/2024 12/07/22   Christopher Savannah, PA-C    Allergies: Other    Review of Systems  Genitourinary:  Positive for flank pain.    Updated Vital Signs BP (!) 153/89  (BP Location: Right Arm)   Pulse (!) 107   Temp 98.5 F (36.9 C) (Oral)   Resp 16   Ht 5' 9 (1.753 m)   Wt (!) 164.2 kg   LMP 06/23/2024   SpO2 98%   BMI 53.46 kg/m   Physical Exam Vitals and nursing note reviewed.  Constitutional:      General: She is not in acute distress.    Comments: Uncomfortable 38 year old in NAD  HENT:     Head: Normocephalic and atraumatic.     Nose: Nose normal.  Eyes:     General: No scleral icterus. Cardiovascular:     Rate and Rhythm: Normal rate and regular rhythm.     Pulses: Normal pulses.     Heart sounds: Normal heart sounds.  Pulmonary:     Effort: Pulmonary effort is normal. No respiratory distress.     Breath sounds: No wheezing.  Abdominal:     Palpations: Abdomen is soft.     Tenderness: There is no abdominal tenderness. There is no guarding or rebound.  Musculoskeletal:     Cervical back: Normal range of motion.     Right lower leg: No edema.     Left lower leg: No edema.     Comments: Left flank with muscular tenderness patient flinches with palpation.  No rashes  Skin:    General: Skin is warm and dry.     Capillary Refill:  Capillary refill takes less than 2 seconds.  Neurological:     Mental Status: She is alert. Mental status is at baseline.  Psychiatric:        Mood and Affect: Mood normal.        Behavior: Behavior normal.     (all labs ordered are listed, but only abnormal results are displayed) Labs Reviewed  URINALYSIS, ROUTINE W REFLEX MICROSCOPIC - Abnormal; Notable for the following components:      Result Value   APPearance HAZY (*)    Protein, ur 100 (*)    All other components within normal limits  HCG, SERUM, QUALITATIVE  CBC  COMPREHENSIVE METABOLIC PANEL WITH GFR    EKG: None  Radiology: No results found.   Procedures   Medications Ordered in the ED  lactated ringers  bolus 1,000 mL (has no administration in time range)  lidocaine  (LIDODERM ) 5 % 1 patch (has no administration in time  range)  methocarbamol (ROBAXIN) tablet 1,000 mg (has no administration in time range)                                    Medical Decision Making Amount and/or Complexity of Data Reviewed Labs: ordered. Radiology: ordered.  Risk Prescription drug management.   This patient presents to the ED for concern of flank pain, this involves a number of treatment options, and is a complaint that carries with it a moderate to high risk of complications and morbidity. A differential diagnosis was considered for the patient's symptoms which is discussed below:   The differential diagnosis of emergent flank pain includes, but is not limited to :Abdominal aortic aneurysm,, Renal artery embolism,Renal vein thrombosis, Aortic dissection, Mesenteric ischemia, Pyelonephritis, Renal infarction, Renal hemorrhage, Nephrolithiasis/ Renal Colic, Bladder tumor,Cystitis, Biliary colic, Pancreatitis Perforated peptic ulcer Appendicitis ,Inguinal Hernia, Diverticulitis, Bowel obstruction Ectopic Pregnancy,PID/TOA,Ovarian cyst, Ovarian torsion) Shingles Lower lobe pneumonia, Retroperitoneal hematoma/abscess/tumor, Epidural abscess, Epidural hematoma    Co morbidities: Discussed in HPI   Brief History:   Patient is a 38 year old female with a past medical history significant for migraines, anxiety, depression, morbid obesity, OSA, HTN  She presents emergency room today with complaints of left flank pain seems that she every so often has an episode of pain that comes on had sharp stabbing left flank pain which will then resolve on its own.  She has been seen multiple times and told that this is likely muscular but she is very concerned that she has a kidney stone or some other cause of these symptoms.  She denies any nausea or vomiting no chest pain difficulty breathing no hemoptysis the pain in her flank is not worse with breathing no history of DVT.  She states that she does have more pain with coughing and  certain movements.    EMR reviewed including pt PMHx, past surgical history and past visits to ER.   See HPI for more details   Lab Tests:   I personally reviewed all laboratory work and imaging. Metabolic panel without any acute abnormality specifically kidney function within normal limits and no significant electrolyte abnormalities. CBC without leukocytosis or significant anemia. UA without hematuria or evidence of infection hCG negative for pregnancy.  Imaging Studies:      Cardiac Monitoring:  The patient was maintained on a cardiac monitor.  I personally viewed and interpreted the cardiac monitored which showed an underlying rhythm of: NSR NA   Medicines ordered:  I ordered medication including Lidoderm  patch, LR 1 L, Robaxin 1 g for pain hydration Reevaluation of the patient after these medicines showed that the patient improved I have reviewed the patients home medicines and have made adjustments as needed   Critical Interventions:     Consults/Attending Physician      Reevaluation:  After the interventions noted above I re-evaluated patient and found that they have :improved   Social Determinants of Health:      Problem List / ED Course:  Suspect muscular pain if CT renal stone study negative discharged home with Lidoderm  patches Tylenol  ibuprofen  and muscle laxer if positive for renal stone we will treat appropriately Patient care handed off to Leita Chancy 3:01 PM for CT follow up   Dispostion:  After consideration of the diagnostic results and the patients response to treatment, I feel that the patent would benefit from outpatient follow-up.   Final diagnoses:  None    ED Discharge Orders     None          Neldon Hamp RAMAN, [place] 06/30/24 1501    Simon Lavonia SAILOR, MD 06/30/24 1535

## 2024-07-16 ENCOUNTER — Ambulatory Visit (INDEPENDENT_AMBULATORY_CARE_PROVIDER_SITE_OTHER): Admitting: Obstetrics & Gynecology

## 2024-07-16 ENCOUNTER — Encounter: Payer: Self-pay | Admitting: Obstetrics & Gynecology

## 2024-07-16 VITALS — BP 138/86 | Wt 348.0 lb

## 2024-07-16 DIAGNOSIS — N946 Dysmenorrhea, unspecified: Secondary | ICD-10-CM

## 2024-07-16 MED ORDER — IBUPROFEN 800 MG PO TABS: 800.0000 mg | ORAL_TABLET | Freq: Three times a day (TID) | ORAL | 3 refills | Status: AC | PRN

## 2024-07-16 MED ORDER — ACETAMINOPHEN 500 MG PO TABS: 1000.0000 mg | ORAL_TABLET | Freq: Four times a day (QID) | ORAL | Status: AC | PRN

## 2024-07-16 NOTE — Progress Notes (Signed)
 GYNECOLOGY OFFICE VISIT NOTE  History:  Sandra Wong is a 38 y.o. [id number] here today for follow up for dysmenorrhea. Also was evaluated in ER recently, CT scan done to evaluate for possible kidney stones showed displaced tubal ligation [place] clip, anterior to uterus, near bladder dome. During last menstrual period, she took Ibuprofen  400 mg and this did not help much with her pain.. She denies any current abnormal vaginal discharge, bleeding, pelvic pain or other concerns.  Past Medical History:  Diagnosis Date   Abortion    Anemia 2009   s/p delivery   Anxiety    Chlamydia    Chronic back pain    Depression    Gonorrhea    Headache(784.0)    no treatment occasional headache   Hypertension    Obesity    Sleep apnea    per patient, no study    Past Surgical History:  Procedure Laterality Date   DILATION AND CURETTAGE OF UTERUS     INDUCED ABORTION     LAPAROSCOPIC TUBAL LIGATION Bilateral 01/15/2016   Procedure: LAPAROSCOPIC BILATERAL TUBAL LIGATION;  Surgeon: Gloris DELENA Hugger, MD;  Location: [facility];  Service: Gynecology;  Laterality: Bilateral;    The following portions of the patient's history were reviewed and updated as appropriate: allergies, current medications, past family history, past medical history, past social history, past surgical history and problem list.   Health Maintenance:  Normal pap and negative HRHPV on 05/2023 [facility]).  Review of Systems:  Pertinent items noted in HPI and remainder of comprehensive ROS otherwise negative.  Physical Exam:  BP 138/86   Wt (!) 348 lb (157.9 kg)   LMP 06/23/2024 (Exact Date)   BMI 51.39 kg/m  CONSTITUTIONAL: Well-developed, well-nourished female in no acute distress.  HEENT:  Normocephalic, atraumatic. External right and left ear normal. No scleral icterus.  NECK: Normal range of motion, supple, no masses noted on observation SKIN: No rash noted. Not diaphoretic. No erythema. No  pallor. MUSCULOSKELETAL: Normal range of motion. No edema noted. NEUROLOGIC: Alert and oriented to person, place, and time. Normal muscle tone coordination. No cranial nerve deficit noted on observation. PSYCHIATRIC: Normal mood and affect. Normal behavior. Normal judgment and thought content. CARDIOVASCULAR: Normal heart rate noted RESPIRATORY: Effort and breath sounds normal, no problems with respiration noted ABDOMEN: No masses or other overt distention noted on observation. No tenderness.   PELVIC: Deferred  Labs and Imaging No results found for this or any previous visit (from the past week). CT Renal Stone Study Result Date: 06/30/2024 CLINICAL DATA:  Abdominal/flank pain, stone suspected L flank pain EXAM: CT ABDOMEN AND PELVIS WITHOUT CONTRAST TECHNIQUE: Multidetector CT imaging of the abdomen and pelvis was performed following the standard protocol without IV contrast. RADIATION DOSE REDUCTION: This exam was performed according to the departmental dose-optimization program which includes automated exposure control, adjustment of the mA and/or kV according to patient size and/or use of iterative reconstruction technique. COMPARISON:  None Available. FINDINGS: Of note, the lack of intravenous contrast limits evaluation of the solid organ parenchyma and vascularity. Lower chest: No focal airspace consolidation or pleural effusion. Hepatobiliary: No mass.No radiopaque stones or wall thickening of the gallbladder. No intrahepatic or extrahepatic biliary ductal dilation. Pancreas: No mass or main ductal dilation. No peripancreatic inflammation or fluid collection. Spleen: Normal size. No mass. Adrenals/Urinary Tract: 1.2 cm right adrenal nodule, likely a small adenoma. No renal mass. No hydronephrosis or nephrolithiasis. The urinary bladder is distended without focal abnormality. Stomach/Bowel:  The stomach is decompressed without focal abnormality. No small bowel wall thickening or inflammation. No  small bowel obstruction.Normal appendix. Vascular/Lymphatic: No aortic aneurysm. No intraabdominal or pelvic lymphadenopathy. Reproductive: The uterus and ovaries are within normal limits for patient's age. Tubal ligation clip within the left adnexa. The right tubal ligation clip is displaced layering anterior to the uterus along the bladder dome. No free pelvic fluid. Other: No pneumoperitoneum, ascites, or mesenteric inflammation. Musculoskeletal: No acute fracture or destructive lesion. Multilevel thoracic osteophytosis. IMPRESSION: 1. No acute intra-abdominal or pelvic abnormality. 2. Small 1.2 cm right adrenal nodule, likely a small adrenal adenoma. Electronically Signed   By: Rogelia Myers M.D.   On: 06/30/2024 15:49    Assessment and Plan:    1. Dysmenorrhea (Primary) Patient was concerned about the displaced right tubal ligation clip, she was assured that this happens usually after tubal ligation surgery and is not the cause of her pain. Advised to take Ibuprofen  and Tylenol  as needed for pain.  If pain continues, may consider hormonal suppression. - ibuprofen  (ADVIL ) 800 MG tablet; Take 1 tablet (800 mg total) by mouth 3 (three) times daily with meals as needed for headache, moderate pain (pain score 4-6) or cramping.  Dispense: 30 tablet; Refill: 3 - acetaminophen  (TYLENOL ) 500 MG tablet; Take 2 tablets (1,000 mg total) by mouth every 6 (six) hours as needed for moderate pain (pain score 4-6) or mild pain (pain score 1-3).  Routine preventative health maintenance measures emphasized. Please refer to After Visit Summary for other counseling recommendations.   Return for any gynecologic concerns.    I spent 25 minutes dedicated to the care of this patient including pre-visit review of records, face to face time with the patient discussing her conditions and treatments, post visit ordering of medications and appropriate tests or procedures, coordinating care and documenting this visit  encounter.    GLORIS HUGGER, MD, FACOG Obstetrician & Gynecologist, [facility] for [employer], [facility]
